# Patient Record
Sex: Male | Born: 1940
Health system: Southern US, Community
[De-identification: ages and names within clinical notes are randomized; demographics above are authoritative.]

## PROBLEM LIST (undated history)

## (undated) DIAGNOSIS — N402 Nodular prostate without lower urinary tract symptoms: Secondary | ICD-10-CM

## (undated) DIAGNOSIS — M722 Plantar fascial fibromatosis: Secondary | ICD-10-CM

## (undated) DIAGNOSIS — Z860101 Personal history of adenomatous and serrated colon polyps: Secondary | ICD-10-CM

## (undated) DIAGNOSIS — Z8719 Personal history of other diseases of the digestive system: Secondary | ICD-10-CM

## (undated) DIAGNOSIS — Z974 Presence of external hearing-aid: Secondary | ICD-10-CM

## (undated) DIAGNOSIS — Z961 Presence of intraocular lens: Secondary | ICD-10-CM

## (undated) DIAGNOSIS — K449 Diaphragmatic hernia without obstruction or gangrene: Secondary | ICD-10-CM

## (undated) DIAGNOSIS — M8580 Other specified disorders of bone density and structure, unspecified site: Secondary | ICD-10-CM

## (undated) DIAGNOSIS — N485 Ulcer of penis: Secondary | ICD-10-CM

## (undated) DIAGNOSIS — Z86010 Personal history of colonic polyps: Secondary | ICD-10-CM

## (undated) DIAGNOSIS — R0600 Dyspnea, unspecified: Secondary | ICD-10-CM

## (undated) DIAGNOSIS — D649 Anemia, unspecified: Secondary | ICD-10-CM

## (undated) DIAGNOSIS — K219 Gastro-esophageal reflux disease without esophagitis: Secondary | ICD-10-CM

## (undated) HISTORY — PX: INGUINAL HERNIA REPAIR: SUR1180

## (undated) HISTORY — DX: Anemia, unspecified: D64.9

## (undated) HISTORY — PX: CATARACT EXTRACTION W/ INTRAOCULAR LENS  IMPLANT, BILATERAL: SHX1307

## (undated) HISTORY — DX: Presence of intraocular lens: Z96.1

## (undated) HISTORY — PX: COLONOSCOPY: SHX174

## (undated) HISTORY — DX: Plantar fascial fibromatosis: M72.2

## (undated) HISTORY — DX: Other specified disorders of bone density and structure, unspecified site: M85.80

## (undated) HISTORY — PX: TONSILLECTOMY: SHX5217

## (undated) HISTORY — DX: Gastro-esophageal reflux disease without esophagitis: K21.9

## (undated) HISTORY — PX: ESOPHAGOGASTRODUODENOSCOPY: SHX1529

---

## 1999-04-04 ENCOUNTER — Ambulatory Visit (HOSPITAL_BASED_OUTPATIENT_CLINIC_OR_DEPARTMENT_OTHER): Admission: RE | Admit: 1999-04-04 | Discharge: 1999-04-04 | Payer: Self-pay | Admitting: *Deleted

## 2004-12-01 ENCOUNTER — Ambulatory Visit: Payer: Self-pay | Admitting: Internal Medicine

## 2004-12-12 ENCOUNTER — Encounter (INDEPENDENT_AMBULATORY_CARE_PROVIDER_SITE_OTHER): Payer: Self-pay | Admitting: *Deleted

## 2004-12-12 ENCOUNTER — Ambulatory Visit (HOSPITAL_COMMUNITY): Admission: RE | Admit: 2004-12-12 | Discharge: 2004-12-12 | Payer: Self-pay | Admitting: Internal Medicine

## 2004-12-12 ENCOUNTER — Ambulatory Visit: Payer: Self-pay | Admitting: Internal Medicine

## 2004-12-12 DIAGNOSIS — K219 Gastro-esophageal reflux disease without esophagitis: Secondary | ICD-10-CM

## 2004-12-12 HISTORY — DX: Gastro-esophageal reflux disease without esophagitis: K21.9

## 2005-01-27 ENCOUNTER — Ambulatory Visit: Payer: Self-pay | Admitting: Internal Medicine

## 2007-01-07 ENCOUNTER — Ambulatory Visit: Payer: Self-pay | Admitting: Internal Medicine

## 2007-01-11 ENCOUNTER — Ambulatory Visit: Payer: Self-pay | Admitting: Internal Medicine

## 2007-01-11 LAB — CONVERTED CEMR LAB
AST: 27 units/L (ref 0–37)
Albumin: 3.8 g/dL (ref 3.5–5.2)
BUN: 15 mg/dL (ref 6–23)
Basophils Absolute: 0 10*3/uL (ref 0.0–0.1)
CO2: 30 meq/L (ref 19–32)
Cholesterol: 206 mg/dL (ref 0–200)
Creatinine, Ser: 0.9 mg/dL (ref 0.4–1.5)
Direct LDL: 133.3 mg/dL
Eosinophils Absolute: 0.4 10*3/uL (ref 0.0–0.6)
HCT: 43.5 % (ref 39.0–52.0)
Hemoglobin, Urine: NEGATIVE
Leukocytes, UA: NEGATIVE
Lymphocytes Relative: 31.1 % (ref 12.0–46.0)
MCV: 91.6 fL (ref 78.0–100.0)
Mucus, UA: NEGATIVE
Neutrophils Relative %: 51.1 % (ref 43.0–77.0)
PSA: 0.87 ng/mL (ref 0.10–4.00)
Potassium: 4 meq/L (ref 3.5–5.1)
RBC: 4.74 M/uL (ref 4.22–5.81)
Sodium: 143 meq/L (ref 135–145)
Specific Gravity, Urine: 1.02 (ref 1.000–1.03)
Squamous Epithelial / LPF: NEGATIVE /lpf
TSH: 2.37 microintl units/mL (ref 0.35–5.50)
Total Protein, Urine: NEGATIVE mg/dL
Triglycerides: 57 mg/dL (ref 0–149)
VLDL: 11 mg/dL (ref 0–40)
WBC: 5.8 10*3/uL (ref 4.5–10.5)

## 2007-01-20 ENCOUNTER — Ambulatory Visit: Payer: Self-pay | Admitting: Internal Medicine

## 2008-04-26 ENCOUNTER — Ambulatory Visit: Payer: Self-pay | Admitting: Internal Medicine

## 2008-04-26 DIAGNOSIS — Z860100 Personal history of colon polyps, unspecified: Secondary | ICD-10-CM | POA: Insufficient documentation

## 2008-04-26 DIAGNOSIS — K219 Gastro-esophageal reflux disease without esophagitis: Secondary | ICD-10-CM | POA: Insufficient documentation

## 2008-08-30 ENCOUNTER — Ambulatory Visit: Payer: Self-pay | Admitting: Internal Medicine

## 2008-08-30 DIAGNOSIS — E299 Testicular dysfunction, unspecified: Secondary | ICD-10-CM | POA: Insufficient documentation

## 2008-08-30 DIAGNOSIS — R234 Changes in skin texture: Secondary | ICD-10-CM | POA: Insufficient documentation

## 2008-09-03 ENCOUNTER — Ambulatory Visit (HOSPITAL_BASED_OUTPATIENT_CLINIC_OR_DEPARTMENT_OTHER): Admission: RE | Admit: 2008-09-03 | Discharge: 2008-09-03 | Payer: Self-pay | Admitting: Internal Medicine

## 2008-09-03 ENCOUNTER — Telehealth: Payer: Self-pay | Admitting: Internal Medicine

## 2009-07-13 ENCOUNTER — Encounter: Payer: Self-pay | Admitting: Internal Medicine

## 2009-07-15 ENCOUNTER — Encounter: Payer: Self-pay | Admitting: Internal Medicine

## 2009-08-26 ENCOUNTER — Ambulatory Visit: Payer: Self-pay | Admitting: Internal Medicine

## 2009-08-26 LAB — CONVERTED CEMR LAB
ALT: 26 units/L (ref 0–53)
AST: 20 units/L (ref 0–37)
BUN: 19 mg/dL (ref 6–23)
CO2: 25 meq/L (ref 19–32)
Chloride: 105 meq/L (ref 96–112)
Cholesterol: 205 mg/dL — ABNORMAL HIGH (ref 0–200)
Creatinine, Ser: 0.97 mg/dL (ref 0.40–1.50)
Glucose, Bld: 102 mg/dL — ABNORMAL HIGH (ref 70–99)
LDL Cholesterol: 133 mg/dL — ABNORMAL HIGH (ref 0–99)
TSH: 2.107 microintl units/mL (ref 0.350–4.500)
Total CHOL/HDL Ratio: 3.6
Triglycerides: 74 mg/dL (ref <150)
VLDL: 15 mg/dL (ref 0–40)

## 2009-08-27 ENCOUNTER — Encounter: Payer: Self-pay | Admitting: Internal Medicine

## 2009-09-04 ENCOUNTER — Encounter: Payer: Self-pay | Admitting: Internal Medicine

## 2009-10-23 ENCOUNTER — Encounter (INDEPENDENT_AMBULATORY_CARE_PROVIDER_SITE_OTHER): Payer: Self-pay | Admitting: *Deleted

## 2009-11-29 ENCOUNTER — Encounter (INDEPENDENT_AMBULATORY_CARE_PROVIDER_SITE_OTHER): Payer: Self-pay | Admitting: *Deleted

## 2009-12-02 ENCOUNTER — Ambulatory Visit: Payer: Self-pay | Admitting: Internal Medicine

## 2009-12-09 ENCOUNTER — Ambulatory Visit: Payer: Self-pay | Admitting: Internal Medicine

## 2009-12-09 LAB — HM COLONOSCOPY

## 2009-12-10 ENCOUNTER — Encounter: Payer: Self-pay | Admitting: Internal Medicine

## 2009-12-13 ENCOUNTER — Encounter (INDEPENDENT_AMBULATORY_CARE_PROVIDER_SITE_OTHER): Payer: Self-pay | Admitting: *Deleted

## 2009-12-16 ENCOUNTER — Ambulatory Visit: Payer: Self-pay | Admitting: Internal Medicine

## 2009-12-24 ENCOUNTER — Ambulatory Visit: Payer: Self-pay | Admitting: Internal Medicine

## 2009-12-24 DIAGNOSIS — K319 Disease of stomach and duodenum, unspecified: Secondary | ICD-10-CM | POA: Insufficient documentation

## 2009-12-27 ENCOUNTER — Encounter: Payer: Self-pay | Admitting: Internal Medicine

## 2010-02-18 ENCOUNTER — Encounter (INDEPENDENT_AMBULATORY_CARE_PROVIDER_SITE_OTHER): Payer: Self-pay | Admitting: *Deleted

## 2010-03-07 ENCOUNTER — Ambulatory Visit: Payer: Self-pay | Admitting: Internal Medicine

## 2010-03-07 DIAGNOSIS — E663 Overweight: Secondary | ICD-10-CM | POA: Insufficient documentation

## 2010-03-07 DIAGNOSIS — K222 Esophageal obstruction: Secondary | ICD-10-CM | POA: Insufficient documentation

## 2010-07-19 ENCOUNTER — Encounter: Payer: Self-pay | Admitting: Internal Medicine

## 2010-07-22 ENCOUNTER — Encounter: Payer: Self-pay | Admitting: Internal Medicine

## 2010-09-03 ENCOUNTER — Encounter: Payer: Self-pay | Admitting: Internal Medicine

## 2010-11-30 LAB — CONVERTED CEMR LAB
Bacteria, UA: NEGATIVE
Cholesterol: 199 mg/dL (ref 0–200)
Creatinine, Ser: 0.9 mg/dL (ref 0.4–1.5)
Crystals: NEGATIVE
Eosinophils Absolute: 0.3 10*3/uL (ref 0.0–0.7)
HCT: 41.8 % (ref 39.0–52.0)
Ketones, ur: NEGATIVE mg/dL
Monocytes Absolute: 0.4 10*3/uL (ref 0.1–1.0)
Nitrite: NEGATIVE
RBC: 4.52 M/uL (ref 4.22–5.81)
Squamous Epithelial / LPF: NEGATIVE /lpf
Total CHOL/HDL Ratio: 4.1
Triglycerides: 58 mg/dL (ref 0–149)
VLDL: 12 mg/dL (ref 0–40)
pH: 6.5 (ref 5.0–8.0)

## 2010-12-02 NOTE — Letter (Signed)
Summary: Patient Notice-Endo Biopsy Results  [facility]  [address] [place], [place] [address]   Phone: [phone]  Fax: [phone]        December 27, 2009 MRN: [id number]    Miguel Thomas [address] [place], [place]  [address]    Dear Mr. GERHOLD,  I am pleased to inform you that the biopsies taken during your recent endoscopic examination did not show any evidence of cancer upon pathologic examination.  They did show some possible pre-cancerous changes at the top ofthe stomach.It is important that you continue to take yor omeprazole at the increased dose and I would like to see you inthe office in 3 months to review things.  Fortunately, it is rare that cancer develops in this type of situation but I do think you should follow-up with me and we can discuss how best to follow this.  Please call [phone] to schedule a return visit to review      your condition. You should see me in June (those appointments should be able to be scheduled in April 2011).  Please call us if you are having persistent problems or have questions about your condition that have not been fully answered at this time.  Sincerely,  Iva Boop MD, [facility]  This letter has been electronically signed by your physician.  Appended Document: Patient Notice-Endo Biopsy Results Letter mailed 2.25.11

## 2010-12-02 NOTE — Letter (Signed)
Summary: Patient Notice- Polyp Results  [facility]  [address] [place], [place] [address]   Phone: [phone]  Fax: [phone]        December 10, 2009 MRN: [id number]    Miguel Thomas [address] [place], [place]  [address]    Dear [name],  The polyp removed from your colon was adenomatous. This means that it was pre-cancerous or that  it had the potential to change into cancer over time.  I recommend that you have a repeat colonoscopy in 5 years to determine if you have developed any new polyps over time. If you develop any new rectal bleeding, abdominal pain or significant bowel habit changes, please contact us before then.  Please call us if you are having persistent problems or have questions about your condition that have not been fully answered at this time.   Sincerely,  Iva Boop MD, [facility]  This letter has been electronically signed by your physician.  Appended Document: Patient Notice- Polyp Results letter mailed 2.11.11

## 2010-12-02 NOTE — Miscellaneous (Signed)
Summary: [facility] PV  Clinical Lists Changes  Observations: Added new observation of NKA: T (12/16/2009 16:17)

## 2010-12-02 NOTE — Letter (Signed)
Summary: EGD Instructions  [facility]  [address] [place], [place] [address]   Phone: [phone]  Fax: [phone]       Miguel Thomas    [birth year]    MRN: [id number]       Procedure Day /Date: Tuesday 12/24/2009     Arrival Time: 7:30 am     Procedure Time: 8:30 am     Location of Procedure:                    _x  _ [facility] (4th Floor)    PREPARATION FOR ENDOSCOPY   On Tuesday 12/24/2009 THE DAY OF THE PROCEDURE:  1.   No solid foods, milk or milk products are allowed after midnight the night before your procedure.  2.   Do not drink anything colored red or purple.  Avoid juices with pulp.  No orange juice.  3.  You may drink clear liquids until 6:30 am, which is 2 hours before your procedure.                                                                                                CLEAR LIQUIDS INCLUDE: Water Jello Ice Popsicles Tea (sugar ok, no milk/cream) Powdered fruit flavored drinks Coffee (sugar ok, no milk/cream) Gatorade Juice: apple, white grape, white cranberry  Lemonade Clear bullion, consomm, broth Carbonated beverages (any kind) Strained chicken noodle soup Hard Candy   MEDICATION INSTRUCTIONS  Unless otherwise instructed, you should take regular prescription medications with a small sip of water as early as possible the morning of your procedure.            OTHER INSTRUCTIONS  You will need a responsible adult at least 72 years of age to accompany you and drive you home.   This person must remain in the waiting room during your procedure.  Wear loose fitting clothing that is easily removed.  Leave jewelry and other valuables at home.  However, you may wish to bring a book to read or an iPod/MP3 player to listen to music as you wait for your procedure to start.  Remove all body piercing jewelry and leave at home.  Total time from sign-in until discharge is approximately 2-3 hours.  You should go  home directly after your procedure and rest.  You can resume normal activities the day after your procedure.  The day of your procedure you should not:   Drive   Make legal decisions   Operate machinery   Drink alcohol   Return to work  You will receive specific instructions about eating, activities and medications before you leave.    The above instructions have been reviewed and explained to me by   Ezra Sites RN  December 16, 2009 5:03 PM   I fully understand and can verbalize these instructions _____________________________ Date _________

## 2010-12-02 NOTE — Letter (Signed)
Summary: [facility]  [facility]   Imported By: Lanelle Bal 09/17/2010 09:29:13  _____________________________________________________________________  External Attachment:    Type:   Image     Comment:   External Document

## 2010-12-02 NOTE — Procedures (Signed)
Summary: Upper Endoscopy w/DIL  Patient: Miguel Thomas Note: All result statuses are Final unless otherwise noted.  Tests: (1) Upper Endoscopy w/DIL (UED)  UED Upper Endoscopy w/DIL                             DONE     [facility]     [address]     [place], [place]  [address]           ENDOSCOPY PROCEDURE REPORT           PATIENT:  [name], [name]  MR#:  [id number]     BIRTHDATE:  1940/11/18, 69 yrs. old  GENDER:  male           ENDOSCOPIST:  Iva Boop, MD, [facility]           PROCEDURE DATE:  12/24/2009     PROCEDURE:  EGD with biopsy, Elease Hashimoto Dilation of the Esophagus     ASA CLASS:  Class I     INDICATIONS:  1) dysphagia  2) dilation of esophageal stricture           MEDICATIONS:   Fentanyl 25 mcg IV, Versed 3 mg IV     TOPICAL ANESTHETIC:  Exactacain Spray           DESCRIPTION OF PROCEDURE:   After the risks benefits and     alternatives of the procedure were thoroughly explained, informed     consent was obtained.  The LB GIF-H180 [id number] endoscope was     introduced through the mouth and advanced to the second portion of     the duodenum, without limitations.  The instrument was slowly     withdrawn as the mucosa was carefully examined.     <<PROCEDUREIMAGES>>           An esophageal ring was found at the gastroesophageal junction.  A     hiatal hernia was found. It was 2 - 3 cm in size. 40-42/43 cm,     sliding.  there was ? abnormal mucosa in the cardia.     Salmon-colored ? columnar mucosal patch on gastric side of GE     junction in hiatal hernia sac. With standard forceps, a biopsy was     obtained and sent to pathology.  The examination was otherwise     normal.    Dilation was then performed at the gastroesphageal     junction           1) Dilator:  Elease Hashimoto  Size(s):  54 French     Resistance:  minimal  Heme:  none           COMPLICATIONS:  None           ENDOSCOPIC IMPRESSION:     1) Ring, esophageal at the gastroesophageal junction -dilated  54     French     2) 2 - 3 cm hiatal hernia     3) Abnormal mucosa in the cardia - ? columnar changes just below     Z-line - biospied     4) Otherwise normal examination.     RECOMMENDATIONS:     1) await biopsy results     Increase omeprazole to 40 mg daily (#30, 11 refills, take 1     30-60 mins before breakfast)           Clear liquids only til 1030 AM then soft  diet today. Normal diet     tomorrow.           REPEAT EXAM:  as needed           Iva Boop, MD, [facility]           CC:  Thomos Lemons, DO     The Patient           n.     eSIGNED:   Iva Boop at 12/24/2009 08:52 AM           Tish Men, [id number]  Note: An exclamation Timathy (!) indicates a result that was not dispersed into the flowsheet. Document Creation Date: 12/24/2009 8:52 AM _______________________________________________________________________  (1) Order result status: Final Collection or observation date-time: 12/24/2009 08:42 Requested date-time:  Receipt date-time:  Reported date-time:  Referring Physician:   Ordering Physician: Stan Head [phone]) Specimen Source:  Source: Launa Grill Order Number: [id number] Lab site:   Appended Document: Upper Endoscopy w/DIL   EGD  Procedure date:  12/24/2009  Findings:         1) Ring, esophageal at the gastroesophageal junction -dilated 54     French     2) 2 - 3 cm hiatal hernia     3) Abnormal mucosa in the cardia - ? columnar changes just below INTESTINAL METAPLASIA     Z-line - biospied     4) Otherwise normal examination.  Procedures Next Due Date:    EGD: 01/2011

## 2010-12-02 NOTE — Letter (Signed)
Summary: [facility] Instructions  [facility]  [address] [place], [place] [address]   Phone: [phone]  Fax: [phone]       Miguel Thomas    08-Mar-1941    MRN: [id number]        Procedure Day /Date:  Monday 02/07     Arrival Time:  7:30am     Procedure Time: 8:30am     Location of Procedure:                    Juliann Pares _  [facility] (4th Floor)                        PREPARATION FOR COLONOSCOPY WITH MOVIPREP   Starting 5 days prior to your procedure  Wednesday 02/02  do not eat nuts, seeds, popcorn, corn, beans, peas,  salads, or any raw vegetables.  Do not take any fiber supplements (e.g. Metamucil, Citrucel, and Benefiber).  THE DAY BEFORE YOUR PROCEDURE         DATE:  02/06   DAY:  Sunday  1.  Drink clear liquids the entire day-NO SOLID FOOD  2.  Do not drink anything colored red or purple.  Avoid juices with pulp.  No orange juice.  3.  Drink at least 64 oz. (8 glasses) of fluid/clear liquids during the day to prevent dehydration and help the prep work efficiently.  CLEAR LIQUIDS INCLUDE: Water Jello Ice Popsicles Tea (sugar ok, no milk/cream) Powdered fruit flavored drinks Coffee (sugar ok, no milk/cream) Gatorade Juice: apple, white grape, white cranberry  Lemonade Clear bullion, consomm, broth Carbonated beverages (any kind) Strained chicken noodle soup Hard Candy                             4.  In the morning, mix first dose of MoviPrep solution:    Empty 1 Pouch A and 1 Pouch B into the disposable container    Add lukewarm drinking water to the top line of the container. Mix to dissolve    Refrigerate (mixed solution should be used within 24 hrs)  5.  Begin drinking the prep at 5:00 p.m. The MoviPrep container is divided by 4 marks.   Every 15 minutes drink the solution down to the next Aivan (approximately 8 oz) until the full liter is complete.   6.  Follow completed prep with 16 oz of clear liquid of your choice  (Nothing red or purple).  Continue to drink clear liquids until bedtime.  7.  Before going to bed, mix second dose of MoviPrep solution:    Empty 1 Pouch A and 1 Pouch B into the disposable container    Add lukewarm drinking water to the top line of the container. Mix to dissolve    Refrigerate  THE DAY OF YOUR PROCEDURE      DATE:  02/07  DAY:  Monday  Beginning at  3:30 a.m. (5 hours before procedure):         1. Every 15 minutes, drink the solution down to the next Naethan (approx 8 oz) until the full liter is complete.  2. Follow completed prep with 16 oz. of clear liquid of your choice.    3. You may drink clear liquids until 6:30am  (2 HOURS BEFORE PROCEDURE).   MEDICATION INSTRUCTIONS  Unless otherwise instructed, you should take regular prescription medications with a small sip of  water   as early as possible the morning of your procedure.         OTHER INSTRUCTIONS  You will need a responsible adult at least 72 years of age to accompany you and drive you home.   This person must remain in the waiting room during your procedure.  Wear loose fitting clothing that is easily removed.  Leave jewelry and other valuables at home.  However, you may wish to bring a book to read or  an iPod/MP3 player to listen to music as you wait for your procedure to start.  Remove all body piercing jewelry and leave at home.  Total time from sign-in until discharge is approximately 2-3 hours.  You should go home directly after your procedure and rest.  You can resume normal activities the  day after your procedure.  The day of your procedure you should not:   Drive   Make legal decisions   Operate machinery   Drink alcohol   Return to work  You will receive specific instructions about eating, activities and medications before you leave.    The above instructions have been reviewed and explained to me by   Wyona Almas RN  December 02, 2009 8:16 AM     I fully  understand and can verbalize these instructions _____________________________ Date _________

## 2010-12-02 NOTE — Procedures (Signed)
Summary: Colonoscopy  Patient: [name] Note: All result statuses are Final unless otherwise noted.  Tests: (1) Colonoscopy (COL)   COL Colonoscopy           DONE      [facility]     [address]     [place], [place]  [address]           COLONOSCOPY PROCEDURE REPORT           PATIENT:  Miguel Thomas, Miguel Thomas  MR#:  [id number]     BIRTHDATE:  07-May-1941, 68 yrs. old  GENDER:  male           ENDOSCOPIST:  Iva Boop, MD, [facility]           PROCEDURE DATE:  12/09/2009     PROCEDURE:  Colonoscopy with snare polypectomy     ASA CLASS:  Class I     INDICATIONS:  history of pre-cancerous (adenomatous) colon polyps     Two 4 mm adenomas removed 12/2004           MEDICATIONS:   Fentanyl 50 mcg IV, Versed 5 mg IV           DESCRIPTION OF PROCEDURE:   After the risks benefits and     alternatives of the procedure were thoroughly explained, informed     consent was obtained.  Digital rectal exam was performed and     revealed no abnormalities and normal prostate.   The LB CF-H180AL     [id number] endoscope was introduced through the anus and advanced to     the cecum, which was identified by both the appendix and ileocecal     valve, without limitations.  The quality of the prep was     excellent, using MoviPrep.  The instrument was then slowly     withdrawn as the colon was fully examined.     Insertion: 4:59 minutes Withdrawal: 12:32 minutes     <<PROCEDUREIMAGES>>           FINDINGS:  A sessile polyp was found in the descending colon. It     was 7 mm in size. Polyp was snared without cautery. Retrieval was     successful. snare polyp  Moderate diverticulosis was found in the     sigmoid colon.  This was otherwise a normal examination of the     colon.   Retroflexed views in the rectum revealed internal     hemorrhoids.    The scope was then withdrawn from the patient and     the procedure completed.           COMPLICATIONS:  None           ENDOSCOPIC IMPRESSION:     1) 7 mm  sessile polyp in the descending colon - removed     2) Moderate diverticulosis in the sigmoid colon     3) Internal hemorrhoids - small     4) Otherwise normal examination - excellent prep     5) Prior adenomas removed in 12/2004     RECOMMENDATIONS:     He is having recurrent dysphagia and has hx of esophageal     stricture dilation. My office will call to arrange EGD and     dilation (in [facility]).           REPEAT EXAM:  In for Colonoscopy, pending biopsy results.           Iva Boop, MD, [facility]  CC:  Thomos Lemons, DO     The Patient           n.     eSIGNED:   Iva Boop at 12/09/2009 09:11 AM           Tish Men, [id number]  Note: An exclamation Zayin (!) indicates a result that was not dispersed into the flowsheet. Document Creation Date: 12/09/2009 9:11 AM _______________________________________________________________________  (1) Order result status: Final Collection or observation date-time: 12/09/2009 09:02 Requested date-time:  Receipt date-time:  Reported date-time:  Referring Physician:   Ordering Physician: Stan Head [phone]) Specimen Source:  Source: Launa Grill Order Number: [id number] Lab site:   Appended Document: Colonoscopy     Procedures Next Due Date:    Colonoscopy: 12/2014

## 2010-12-02 NOTE — Assessment & Plan Note (Signed)
Summary: recall ov...as.   History of Present Illness Visit Type: Follow-up Visit Primary GI MD: Stan Head MD [facility] Primary Provider: Dondra Spry DO Chief Complaint: recall rev, Pt denies any GI sx. History of Present Illness:   70 yo man with GERD and esophageal stricture. He is not having dysphagia after esophageal dilation and PPI therapy. He had intestinal metaplasia of the cardia foyund at [facility].   GI Review of Systems      Denies abdominal pain, acid reflux, belching, bloating, chest pain, dysphagia with liquids, dysphagia with solids, heartburn, loss of appetite, nausea, vomiting, vomiting blood, weight loss, and  weight gain.        Denies anal fissure, black tarry stools, change in bowel habit, constipation, diarrhea, diverticulosis, fecal incontinence, heme positive stool, hemorrhoids, irritable bowel syndrome, jaundice, light color stool, liver problems, rectal bleeding, and  rectal pain. Preventive Screening-Counseling & Management  Alcohol-Tobacco     Smoking Status: never    Current Medications (verified): 1)  Omeprazole 40 Mg  Cpdr (Omeprazole) .Marland Kitchen.. 1 Each Day 30 Minutes Before Meal  Allergies (verified): No Known Drug Allergies  Past History:  Past Medical History: Colonoscopy performed 12/12/2004 - diverticulosis, two small right colon polyps ( adenomatous polyp negative for high grade dysplasia or malignancy)  GERD - ( EGD 12/12/2004 - ring like esophageal stricture with reflux esophagitis), also in 2011 Hearing loss   Adenomatous Colon Polyps Intestinal metaplasia of gastric cardia  Past Surgical History: Reviewed history from 03/06/2010 and no changes required. Hernia Surgery Tonsillectomy  Family History: Reviewed history from 08/26/2009 and no changes required. Family history of coronary artery disease Family history of rheumatic heart disease    Social History: Married (2nd marriage)  he has one son Occupation:  auto parts delivery  Daily  Caffeine Use up to 6/day Patient has never smoked.  Alcohol Use - no  Vital Signs:  Patient profile:   70 year old male Height:      70 inches Weight:      204.38 pounds BMI:     29.43 Pulse rate:   60 / minute Pulse rhythm:   regular BP sitting:   118 / 70  (right arm) Cuff size:   regular  Vitals Entered By: Christie Nottingham CMA Duncan Dull) (Mar 07, 2010 2:38 PM)   Impression & Recommendations:  Problem # 1:  ESOPHAGEAL STRICTURE (ICD-530.3) Assessment Improved continue PPI reduce caffeine lose weight  Problem # 2:  GERD (ICD-530.81) Assessment: Improved continue PPI reduce caffeine lose weight  Problem # 3:  OTHER SPECIFIED DISORDER OF STOMACH AND DUODENUM (ICD-537.89) Assessment: New intestinal metaplasia of cardia, not clear how to follow this there is a real but very rare risk of malignanct I explained that will recheck with EGD 1 year from last as a precaution  Problem # 4:  OVERWEIGHT (ICD-278.02) Assessment: New challenged him to lose at least 10# in 1 year  Patient Instructions: 1)  Please continue current medications.  2)  Plan on repeat upper endoscopy around Feb 2012. 3)  You need to lose weight. Start by limiting portions, amounts. Avoid eating when not hungry. Limit desserts.Look for high fructose corn syrup on food labels and if in first 3 ingredients, avoid that food. Also try to eat whole grains, avoid "white foods" (e.g. white rice, white bread).   4)  Try to lose 10-15# in the next year - goal is to be less than 200# or more 5)  The medication list was reviewed  and reconciled.  All changed / newly prescribed medications were explained.  A complete medication list was provided to the patient / caregiver.

## 2010-12-02 NOTE — Miscellaneous (Signed)
Summary: [facility] Previsit/prep  Clinical Lists Changes  Medications: Added new medication of MOVIPREP 100 GM  SOLR (PEG-KCL-NACL-NASULF-NA ASC-C) As per prep instructions. - Signed Rx of MOVIPREP 100 GM  SOLR (PEG-KCL-NACL-NASULF-NA ASC-C) As per prep instructions.;  #1 x 0;  Signed;  Entered by: Wyona Almas RN;  Authorized by: Iva Boop MD, FACG;  Method used: Electronically to [facility]  [address] [id number] [address] [place], [place]  [address], Ph: [phone] or [phone], Fax: [phone] Observations: Added new observation of NKA: T (12/02/2009 7:43)    Prescriptions: MOVIPREP 100 GM  SOLR (PEG-KCL-NACL-NASULF-NA ASC-C) As per prep instructions.  #1 x 0   Entered by:   Wyona Almas RN   Authorized by:   Iva Boop MD, [facility]   Signed by:   Wyona Almas RN on 12/02/2009   Method used:   [facility] to        [facility]  [address] [id number] (retail)       [address]       [place], [place]  [address]       Ph: [phone] or [phone]       Fax: [phone]   RxID:   [id number]

## 2010-12-02 NOTE — Miscellaneous (Signed)
Summary: flu vaccine  Clinical Lists Changes  Observations: Added new observation of FLU VAX: Historical (07/19/2010 8:20)      Immunization History:  Influenza Immunization History:    Influenza:  historical (07/19/2010)

## 2010-12-02 NOTE — Miscellaneous (Signed)
Summary: omeprazole increase order to 40mg  daily  Clinical Lists Changes  Medications: Added new medication of OMEPRAZOLE 40 MG  CPDR (OMEPRAZOLE) 1 each day 30 minutes before meal - Signed Rx of OMEPRAZOLE 40 MG  CPDR (OMEPRAZOLE) 1 each day 30 minutes before meal;  #30 x 11;  Signed;  Entered by: Alease Frame RN;  Authorized by: Iva Boop MD, FACG;  Method used: [facility]  [address] [id number] [address] [place], [place]  [address], Ph: [phone] or [phone], Fax: [phone] Observations: Added new observation of ALLERGY REV: Done (12/24/2009 8:58) Added new observation of NKA: T (12/24/2009 8:58)    Prescriptions: OMEPRAZOLE 40 MG  CPDR (OMEPRAZOLE) 1 each day 30 minutes before meal  #30 x 11   Entered by:   Alease Frame RN   Authorized by:   Iva Boop MD, [facility]   Signed by:   Alease Frame RN on 12/24/2009   Method used:   [facility]        [facility]  [address] [id number] ([facility]ail)       [address]       [place], [place]  [address]       Ph: [phone] or [phone]       Fax: [phone]   RxID:   [id number]

## 2010-12-02 NOTE — Procedures (Signed)
Summary: EGD   EGD  Procedure date:  12/12/2004  Findings:      Findings: Stricture:  Location: [facility]   Patient Name: Miguel Thomas, Miguel Thomas MRN: [id number] Procedure Procedures: Panendoscopy (EGD) CPT: 43235.    with balloon dilation. CPT: T9508883.  Personnel: Endoscopist: Iva Boop, MD, [facility]  Referred By: Nilda Simmer, MD.  Exam Location: Exam performed in Endoscopy Suite. Outpatient  Patient Consent: Procedure, Alternatives, Risks and Benefits discussed, consent obtained,  Indications Symptoms: Dysphagia.  History  Current Medications: Patient is not currently taking Coumadin.  Pre-Exam Physical: Performed Dec 12, 2004  Cardio-pulmonary exam, HEENT exam, Mental status exam WNL.  Exam Exam Info: Maximum depth of insertion Duodenum, intended Duodenum. Patient position: on left side. Gastric retroflexion performed. Images taken. ASA Classification: I. Tolerance: excellent.  Sedation Meds: Patient assessed and found to be appropriate for moderate (conscious) sedation. Benzocaine/tetracaine given aerosolized. Versed 5 mg. given IV. Fentanyl 50 mcg. given IV.  Monitoring: BP and pulse monitoring done. Oximetry used. Supplemental O2 given  Fluoroscopy: Fluoroscopy was not used.  Findings Normal: Proximal Esophagus to Mid Esophagus.  STRICTURE / STENOSIS: Stricture in Distal Esophagus.  Constriction: partial. Etiology: benign due to reflux. Lumen diameter is 14 mm. ICD9: Esophageal Stricture: 530.3.  - Dilation: Distal Esophagus. for esophageal stricture. Balloon/Microvasive dilator used, Diameter: 15,16.5,18 mm, Moderate Resistance, Minimal Heme present on extraction. Patient tolerance excellent. Outcome: successful.  ESOPHAGEAL INFLAMMATION: as a result of reflux. Length of inflammation: 1 cm. [place] Classification: Grade B. ICD9: Esophagitis, Reflux: 530.11.  - Normal: Fundus to Duodenal 2nd Portion.   Assessment Abnormal examination, see  findings above.  Diagnoses: 530.3: Esophageal Stricture.  530.11: Esophagitis, Reflux.   Comments: Ring-like esophageal stricture with reflux esophagitis. Events  Unplanned Intervention: No unplanned interventions were required.  Plans Instructions: Clear or full liquids: until 130 PM. Resume previous diet: soft today.  Medication(s): PPI: Omeprazole/Prilosec 20 mg QD,   Comments: Try generic omeprazole vs. Prilosec otc each day Disposition: After procedure patient sent to recovery.  Scheduling: Colonoscopy, to Iva Boop, MD, [facility], next  Call office for appointment, to Iva Boop, MD, [facility], 6-8 weeks   CC:   Nilda Simmer, MD  This report was created from the original endoscopy report, which was reviewed and signed by the above listed endoscopist.

## 2010-12-02 NOTE — Miscellaneous (Signed)
Summary: [facility]  [facility]   Imported By: Lanelle Bal 07/31/2010 08:33:37  _____________________________________________________________________  External Attachment:    Type:   Image     Comment:   External Document

## 2010-12-02 NOTE — Letter (Signed)
Summary: Office Visit Letter  [facility]  [address] [place], [place] [address]   Phone: [phone]  Fax: [phone]      February 18, 2010 MRN: [id number]   Miguel Thomas [address] [place], [place]  [address]   Dear Mr. HEIDRICH,   According to our records, it is time for you to schedule a follow-up office visit with [place].   At your convenience, please call [phone] (option #2)to schedule an office visit. If you have any questions, concerns, or feel that this letter is in error, we would appreciate your call.   Sincerely,    Iva Boop, M.D.  [facility] Gastroenterology Division [phone]

## 2010-12-22 ENCOUNTER — Encounter: Payer: Self-pay | Admitting: Internal Medicine

## 2010-12-26 ENCOUNTER — Encounter (INDEPENDENT_AMBULATORY_CARE_PROVIDER_SITE_OTHER): Payer: Self-pay

## 2010-12-30 ENCOUNTER — Encounter: Payer: Self-pay | Admitting: Internal Medicine

## 2010-12-30 NOTE — Letter (Signed)
Summary: Endoscopy Letter  [facility]  [address]   [place], [place] [address]   Phone: [phone]  Fax: [phone]      December 22, 2010 MRN: [id number]   Miguel Thomas [address] [place], [place]  [address]   Dear Miguel Thomas,   According to your medical record, it is time for you to schedule an Endoscopy. Endoscopic screening is recommended for patients with certain upper digestive tract conditions because of associated increased risk for cancers of the upper digestive system.  This letter has been generated based on the recommendations made at the time of your prior procedure. If you feel that in your particular situation this may no longer apply, please contact our office.  Please call our office at [phone]) to schedule this appointment or to update your records at your earliest convenience.  Thank you for cooperating with [place] to provide you with the very best care possible.   Sincerely,   Stan Head, M.D.  [facility] Gastroenterology Division [phone]

## 2011-01-06 ENCOUNTER — Other Ambulatory Visit: Payer: Self-pay | Admitting: Internal Medicine

## 2011-01-06 ENCOUNTER — Other Ambulatory Visit (AMBULATORY_SURGERY_CENTER): Payer: Medicare Other | Admitting: Internal Medicine

## 2011-01-06 DIAGNOSIS — K449 Diaphragmatic hernia without obstruction or gangrene: Secondary | ICD-10-CM

## 2011-01-06 DIAGNOSIS — K219 Gastro-esophageal reflux disease without esophagitis: Secondary | ICD-10-CM

## 2011-01-06 DIAGNOSIS — R933 Abnormal findings on diagnostic imaging of other parts of digestive tract: Secondary | ICD-10-CM

## 2011-01-08 NOTE — Miscellaneous (Signed)
Summary: Lec previsit..  Clinical Lists Changes  Observations: Added new observation of NKA: T (12/30/2010 7:55)

## 2011-01-08 NOTE — Letter (Signed)
Summary: EGD Instructions  [facility]  [address] [place], [place] [address]   Phone: [phone]  Fax: [phone]       Miguel Thomas    Feb 05, 1941    MRN: [id number]       Procedure Day /Date: Tuesday 01/06/2011     Arrival Time: 8:30 am     Procedure Time: 9:30 am     Location of Procedure:                    _ x _  Endoscopy Center (4th Floor)    PREPARATION FOR ENDOSCOPY   On Tuesday 3/6/2012THE DAY OF THE PROCEDURE:  1.   No solid foods, milk or milk products are allowed after midnight the night before your procedure.  2.   Do not drink anything colored red or purple.  Avoid juices with pulp.  No orange juice.  3.  You may drink clear liquids until 7:30 am, which is 2 hours before your procedure.                                                                                                CLEAR LIQUIDS INCLUDE: Water Jello Ice Popsicles Tea (sugar ok, no milk/cream) Powdered fruit flavored drinks Coffee (sugar ok, no milk/cream) Gatorade Juice: apple, white grape, white cranberry  Lemonade Clear bullion, consomm, broth Carbonated beverages (any kind) Strained chicken noodle soup Hard Candy   MEDICATION INSTRUCTIONS  Unless otherwise instructed, you should take regular prescription medications with a small sip of water as early as possible the morning of your procedure.          OTHER INSTRUCTIONS  You will need a responsible adult at least 72 years of age to accompany you and drive you home.   This person must remain in the waiting room during your procedure.  Wear loose fitting clothing that is easily removed.  Leave jewelry and other valuables at home.  However, you may wish to bring a book to read or an iPod/MP3 player to listen to music as you wait for your procedure to start.  Remove all body piercing jewelry and leave at home.  Total time from sign-in until discharge is approximately 2-3 hours.  You should go home  directly after your procedure and rest.  You can resume normal activities the day after your procedure.  The day of your procedure you should not:   Drive   Make legal decisions   Operate machinery   Drink alcohol   Return to work  You will receive specific instructions about eating, activities and medications before you leave.    The above instructions have been reviewed and explained to me by   Ulis Rias RN  December 30, 2010 8:12 AM     I fully understand and can verbalize these instructions _____________________________ Date _________

## 2011-01-13 ENCOUNTER — Encounter: Payer: Self-pay | Admitting: Internal Medicine

## 2011-01-13 NOTE — Procedures (Addendum)
Summary: Upper Endoscopy  Patient: [name] Note: All result statuses are Final unless otherwise noted.  Tests: (1) Upper Endoscopy (EGD)   EGD Upper Endoscopy       DONE     [facility]     [address]     [place], [place]  [address]          ENDOSCOPY PROCEDURE REPORT          PATIENT:  Miguel Thomas, Miguel Thomas  MR#:  [id number]     BIRTHDATE:  30-Sep-1941, 70 yrs. old  GENDER:  male          ENDOSCOPIST:  Iva Boop, MD, [facility]          PROCEDURE DATE:  01/06/2011     PROCEDURE:  EGD with biopsy, 19[address]     ASA CLASS:  Class I     INDICATIONS:  GERD follow-up intestinal metaplasia of cardia/GE     junction          MEDICATIONS:   Fentanyl 50 mcg IV, Versed 6 mg IV     TOPICAL ANESTHETIC:  Exactacain Spray          DESCRIPTION OF PROCEDURE:   After the risks benefits and     alternatives of the procedure were thoroughly explained, informed     consent was obtained.  The LB [id number]-H180 [id number] endoscope was     introduced through the mouth and advanced to the second portion of     the duodenum, without limitations.  The instrument was slowly     withdrawn as the mucosa was fully examined.     <<PROCEDUREIMAGES>>          Slightly irregular Z-line (40 cm) at the gastroesophageal     junction. Did not see the tongue of salmon mucosa seen last year     (on retroflex and forward views). Multiple biopsies were obtained     and sent to pathology.  A hiatal hernia was found. Sliding, 2-3 cm     (40-42-3 cm) as before.  Otherwise the examination was normal.     Retroflexed views revealed a hiatal hernia.    The scope was then     withdrawn from the patient and the procedure completed.          COMPLICATIONS:  None          ENDOSCOPIC IMPRESSION:     1) Irregular Z-line at the gastroesophageal junction     2) Hiatal hernia     3) Otherwise normal examination     RECOMMENDATIONS:     1) Await biopsy results     2) Continue omeprazole          Iva Boop, MD,  [facility]          CC:  The Patient          n.     eSIGNED:   Iva Boop at 01/06/2011 10:24 AM          Tish Men, [id number]  Note: An exclamation Keyandre (!) indicates a result that was not dispersed into the flowsheet. Document Creation Date: 01/06/2011 10:24 AM _______________________________________________________________________  (1) Order result status: Final Collection or observation date-time: 01/06/2011 10:15 Requested date-time:  Receipt date-time:  Reported date-time:  Referring Physician:   Ordering Physician: Stan Head [phone]) Specimen Source:  Source: Launa Grill Order Number: [id number] Lab site:

## 2011-01-20 NOTE — Letter (Signed)
Summary: Patient Notice-Endo Biopsy Results  [facility]  [address] [place], [place] [address]   Phone: [phone]  Fax: [phone]        January 13, 2011 MRN: [id number]    KARLA VINES [address] [place], [place]  [address]    Dear Mr. NEVINS,  I am pleased to inform you that the biopsies taken during your recent endoscopic examination did not show any evidence of the pre-cancerous changes identified previously. There was some mild microscopic inflammation only. You should continue your omeprazole and keep up the good work on your weight loss and diet.  No further action is needed at this time.  Please follow-up with      your primary care physician for your other healthcare needs.  Please call us if you are having persistent problems or have questions about your condition that have not been fully answered at this time.  Sincerely,  Iva Boop MD, [facility]  This letter has been electronically signed by your physician.  Appended Document: Patient Notice-Endo Biopsy Results letter mailed

## 2011-03-20 NOTE — Assessment & Plan Note (Signed)
[name]                           PRIMARY CARE OFFICE NOTE   NAME:Miguel Thomas, Miguel Thomas                         MRN:          [id number]  DATE:01/07/2007                            DOB:          05-29-1941    CHIEF COMPLAINT:  New patient to practice/possible right ear infection.   HISTORY OF PRESENT ILLNESS:  The patient is a 70 year old white male  with above complaints.  He has not had a primary care physician in the  past, periodically is followed by Dr. Katrinka Blazing of urgent care.   PAST MEDICAL HISTORY:  Significant for gastroesophageal reflux disease,  has had previously an endoscopy with Dr. Leone Payor in February 2006 which  revealed esophagitis and esophageal stricture.  The patient has been  doing fairly well with omeprazole 20 mg that he takes at bedtime.   He recently went for hearing aid check and audiologist noted changes in  his tympanic membrane and suggested followup with a physician.  He  denies any pain in his ear, there has been no fever.   PAST MEDICAL HISTORY SUMMARY:  1. Hernia repair in 1997.  2. Gastroesophageal reflux disease with esophageal stricture in 2006.  3. Status post tonsillectomy in 1952.  4. History of colon polyps.   CURRENT MEDICATIONS:  Omeprazole 20 mg once a day.   ALLERGIES TO MEDICATIONS:  None known.   SOCIAL HISTORY:  The patient is married.  This is his second marriage.  He has one son.  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   FAMILY HISTORY:  Does not know his mother's history - she died a week  after he was born.  Father deceased in his 67s, unknown cause.  He has a  total of seven brothers.  One brother noted to have MI.  Two brothers  with rheumatoid heart disease.   HABITS:  No alcohol, no tobacco, no recreational drug use.   REVIEW OF SYSTEMS:  No fevers, chills.  HEENT symptoms as noted above.  Denies any chest pain or dyspnea.  Heartburn symptoms, throat symptoms  controlled  on omeprazole.  All other systems negative.   PHYSICAL EXAMINATION:  VITAL SIGNS:  Height is 5 feet 10 inches, weight  is 206 pounds, temperature is 97.2, pulse is 64, BP is 128/78 in the  left arm in a seated position.  GENERAL:  The patient is a pleasant, well-developed, well-nourished 19-  year-old white male who appears his stated age.  HEENT:  Normocephalic, atraumatic.  Pupils are equal and reactive to  light bilaterally.  Extraocular motility was intact.  The patient was  anicteric.  Conjunctivae were within normal limits.  The patient has  some scaliness the top of his forehead.  Oropharyngeal exam was  unremarkable.  His right tympanic membrane was retracted, dull, and  yellowish in color.  Left TM was unremarkable.  NECK:  Supple.  No adenopathy, carotid bruit or thyromegaly.  CHEST:  Normal respiratory effort.  Chest was clear to auscultation  bilaterally.  No rhonchi, rales or wheezing.  CARDIOVASCULAR:  Regular rate  and rhythm.  No murmurs, rubs or gallops  appreciated.  ABDOMEN:  Soft, nontender, positive bowel sounds.  No organomegaly.  MUSCULOSKELETAL:  No clubbing, cyanosis, or edema.  Skin was warm and  dry.  The patient had intact and bounding pedis dorsalis pulses of his  lower extremities.   IMPRESSION/RECOMMENDATION:  1. Serous otitis media.  2. Gastroesophageal reflux disease with history of esophageal      stricture.  3. History of colon polyps.  4. Health maintenance.   RECOMMENDATIONS:  The patient was prescribed Ceftin 500 mg p.o. q.12h.  for the next 10 days.  He will follow up in approximately 2 weeks.  Before a followup visit, will obtain baseline labs, screen him for  diabetes, check his lipids, and also check PSA.     Barbette Hair. Artist Pais, DO  Electronically Signed    RDY/MedQ  DD: 01/07/2007  DT: 01/07/2007  Job #: [id number]

## 2011-05-08 ENCOUNTER — Emergency Department (HOSPITAL_COMMUNITY): Payer: Medicare Other

## 2011-05-08 ENCOUNTER — Observation Stay (HOSPITAL_COMMUNITY)
Admission: EM | Admit: 2011-05-08 | Discharge: 2011-05-10 | Disposition: A | Discharge status: Home / Self Care | Payer: Medicare Other | Attending: Internal Medicine | Admitting: Internal Medicine

## 2011-05-08 ENCOUNTER — Encounter (HOSPITAL_COMMUNITY): Payer: Self-pay

## 2011-05-08 DIAGNOSIS — D696 Thrombocytopenia, unspecified: Secondary | ICD-10-CM | POA: Insufficient documentation

## 2011-05-08 DIAGNOSIS — R42 Dizziness and giddiness: Secondary | ICD-10-CM | POA: Insufficient documentation

## 2011-05-08 DIAGNOSIS — Z79899 Other long term (current) drug therapy: Secondary | ICD-10-CM | POA: Insufficient documentation

## 2011-05-08 DIAGNOSIS — Z7982 Long term (current) use of aspirin: Secondary | ICD-10-CM | POA: Insufficient documentation

## 2011-05-08 DIAGNOSIS — K219 Gastro-esophageal reflux disease without esophagitis: Secondary | ICD-10-CM | POA: Insufficient documentation

## 2011-05-08 DIAGNOSIS — R112 Nausea with vomiting, unspecified: Secondary | ICD-10-CM | POA: Insufficient documentation

## 2011-05-08 DIAGNOSIS — R7309 Other abnormal glucose: Secondary | ICD-10-CM | POA: Insufficient documentation

## 2011-05-08 DIAGNOSIS — K5289 Other specified noninfective gastroenteritis and colitis: Principal | ICD-10-CM | POA: Insufficient documentation

## 2011-05-08 DIAGNOSIS — D649 Anemia, unspecified: Secondary | ICD-10-CM | POA: Insufficient documentation

## 2011-05-08 DIAGNOSIS — R9431 Abnormal electrocardiogram [ECG] [EKG]: Secondary | ICD-10-CM | POA: Insufficient documentation

## 2011-05-08 LAB — DIFFERENTIAL
Basophils Relative: 0 % (ref 0–1)
Eosinophils Absolute: 0 10*3/uL (ref 0.0–0.7)
Eosinophils Relative: 0 % (ref 0–5)
Lymphocytes Relative: 6 % — ABNORMAL LOW (ref 12–46)
Monocytes Absolute: 0.4 10*3/uL (ref 0.1–1.0)
Monocytes Relative: 5 % (ref 3–12)
Neutro Abs: 7.9 10*3/uL — ABNORMAL HIGH (ref 1.7–7.7)

## 2011-05-08 LAB — COMPREHENSIVE METABOLIC PANEL
ALT: 19 U/L (ref 0–53)
AST: 20 U/L (ref 0–37)
Albumin: 3.4 g/dL — ABNORMAL LOW (ref 3.5–5.2)
Alkaline Phosphatase: 70 U/L (ref 39–117)
CO2: 25 mEq/L (ref 19–32)
Sodium: 139 mEq/L (ref 135–145)

## 2011-05-08 LAB — CK TOTAL AND CKMB (NOT AT ARMC): Total CK: 61 U/L (ref 7–232)

## 2011-05-08 LAB — CBC
MCH: 31.3 pg (ref 26.0–34.0)
MCV: 91.5 fL (ref 78.0–100.0)

## 2011-05-09 LAB — BASIC METABOLIC PANEL
CO2: 27 mEq/L (ref 19–32)
Calcium: 8.3 mg/dL — ABNORMAL LOW (ref 8.4–10.5)
Creatinine, Ser: 0.68 mg/dL (ref 0.50–1.35)
GFR calc Af Amer: >60 mL/min (ref >60)
GFR calc non Af Amer: >60 mL/min (ref >60)
Glucose, Bld: 101 mg/dL — ABNORMAL HIGH (ref 70–99)
Potassium: 3.8 mEq/L (ref 3.5–5.1)
Sodium: 140 mEq/L (ref 135–145)

## 2011-05-09 LAB — FERRITIN: Ferritin: 164 ng/mL (ref 22–322)

## 2011-05-09 LAB — CARDIAC PANEL(CRET KIN+CKTOT+MB+TROPI)
CK, MB: 1.9 ng/mL (ref 0.3–4.0)
CK, MB: 2.1 ng/mL (ref 0.3–4.0)
Relative Index: INVALID (ref 0.0–2.5)
Total CK: 66 U/L (ref 7–232)
Total CK: 73 U/L (ref 7–232)
Troponin I: <0.3 ng/mL (ref <0.30)
Troponin I: <0.3 ng/mL (ref <0.30)

## 2011-05-09 LAB — LIPID PANEL
LDL Cholesterol: 103 mg/dL — ABNORMAL HIGH (ref 0–99)
Triglycerides: 50 mg/dL (ref <150)
VLDL: 10 mg/dL (ref 0–40)

## 2011-05-09 LAB — IRON AND TIBC
Iron: 85 ug/dL (ref 42–135)
UIBC: 178 ug/dL

## 2011-05-09 LAB — HEMOGLOBIN A1C
Hgb A1c MFr Bld: 5.4 % (ref <5.7)
Mean Plasma Glucose: 108 mg/dL (ref <117)

## 2011-05-09 LAB — FOLATE: Folate: 16.1 ng/mL

## 2011-05-09 NOTE — H&P (Signed)
NAMEGIRARD, KOONTZ NO.:  [id number]  MEDICAL RECORD NO.:  [id number]  LOCATION:  1305                         FACILITY:  [facility]  PHYSICIAN:  Hillery Aldo, M.D.   DATE OF BIRTH:  04/26/1941  DATE OF ADMISSION:  05/08/2011 DATE OF DISCHARGE:                             HISTORY & PHYSICAL   PRIMARY CARE PHYSICIAN:  Barbette Hair. Artist Pais, DO  CHIEF COMPLAINT:  Nausea with vomiting.  HISTORY OF PRESENT ILLNESS:  The patient is a 70 year old male who developed the sudden onset of dizziness last evening.  Later on in the night, this was followed by diarrhea x2 and nausea and vomiting x3 to 4 episodes.  The patient states that he ate pasta with sauce along with his wife last evening, but she has not had any similar symptoms. Earlier in the day, the patient had chilly from a fast food restaurant, but no one else ate the same thing.  He denies any frank fever, though he reports some chills earlier today.  He denies any recent treatment with antibiotics.  Denies any blood in the stools or black stools.  The patient states that lying still eases off his symptoms, but movement seems to worsen them.  Upon initial evaluation in the emergency department, the patient was given antiemetics, but continues to be symptomatic and therefore has been referred to the hospitalist service for further evaluation and treatment.  PAST MEDICAL HISTORY: 1. Gastroesophageal reflux disease status post upper endoscopy showing     esophagitis and esophageal stricture in the past. 2. Hearing loss. 3. History of colon polyps.  PAST SURGICAL HISTORY: 1. Right inguinal hernia repair. 2. Left shoulder surgery. 3. Bilateral cataracts. 4. Tonsillectomy.  CURRENT MEDICATIONS: 1. Aspirin daily. 2. Omeprazole 40 mg p.o. daily.  ALLERGIES:  No known drug allergies.  SOCIAL HISTORY:  The patient is on his second marriage and currently lives with his wife in [place].  He is a lifelong  nonsmoker.  Denies any alcohol or drug use.  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  FAMILY HISTORY:  The patient's mother died 1 week after he was born of uncertain causes.  The patient's father died at age 59 of uncertain causes.  He has 6 siblings.  He reports 3 of his brothers died, but is not entirely sure of the details.  He thinks May 05, 2024have died from cancer, another from rheumatic heart disease, and another from an MI. He has one healthy son.  REVIEW OF SYSTEMS:  CONSTITUTIONAL:  No fever or chills.  He has had a diminished appetite since his symptoms started, but normal appetite up until the onset of his illness.  He has had some intentional weight loss of over 20 pounds with dieting.  HEENT:  History of cataracts with lens implants.  Wears hearing aids.  CARDIOVASCULAR:  Denies any history of heart disease.  No chest pain or palpitations.  RESPIRATORY:  Denies any history of lung problems.  No dyspnea.  Occasional cough.  GI:  Positive for nausea, vomiting, diarrhea, but no melena or hematochezia.  Positive for lower abdominal pain.  GU:  No dysuria, hesitancy, or hematuria.  Comprehensive 14-point review of systems is otherwise unremarkable.  PHYSICAL EXAMINATION:  VITAL SIGNS:  Temperature 98.0, blood pressure 130/74, pulse 85, respirations 18, O2 saturation 98% on room air. GENERAL:  Well-developed, well-nourished male in no acute distress. HEENT:  Normocephalic, atraumatic.  PERRL.  EOMI.  Oropharynx is clear with fair dentition and moist mucous membranes.  NECK:  Supple, no thyromegaly, no lymphadenopathy, no jugular venous distention. CHEST:  Diminished breath sounds bilaterally, but otherwise clear. HEART:  Regular rate, rhythm.  No murmurs, rubs or gallops. ABDOMEN:  Soft, mildly tender to the lower quadrants.  Positive bowel sounds in all 4 quadrants. EXTREMITIES:  No clubbing, edema, or cyanosis. SKIN:  Warm, dry.  No rashes. NEUROLOGIC:  The  patient is alert and oriented x3.  Cranial nerves II- XII are grossly intact except for some hearing acuity loss.  Moves all extremities x4 with equal strength.  DATA REVIEW:  Chest x-ray shows no active disease with course and interstitial markings, likely chronic.  LABORATORY DATA:  Lipase is 21.  Sodium is 139, potassium 3.5, chloride 107, bicarb 25, BUN 15, creatinine 0.64, glucose 146, calcium 8.2, total bilirubin 0.8, alkaline phosphatase 70, AST 20, ALT 19, total protein 6.5, albumin 3.4.  White blood cell count is 8.9, hemoglobin 12.5, hematocrit 36.5, platelets 135.  12-lead EKG shows normal sinus rhythm at 74 beats per minute with a borderline interventricular conduction delay.  Probable old posterior infarction.  ASSESSMENT/PLAN: 1. Probable gastroenteritis:  We will admit the patient for 23-hour     observation, hydrate him for 2 L of IV fluids, provide antiemetics     p.r.n. and a clear liquid, diet which can be advanced as tolerated.     We will obtain stool studies including a stool culture and PCR for     Clostridium difficile.  If his symptoms do not resolve with     conservative therapy, would pursue imaging in the morning. 2. Hyperglycemia:  Not a fasting sample.  We will check hemoglobin A1c     to determine his overall glycemic control. 3. Normocytic anemia/thrombocytopenia:  We will check an anemia panel. 4. Gastroesophageal reflux disease:  Continue the patient's PPI     therapy. 5. EKG changes:  The patient is not endorsing any history of heart     problems and does not have any current chest pain.  Nevertheless,     with his history of feeling dizzy last night and his EKG changes,     will be prudent to cycle cardiac markers q.6 h. x3 sets.  We would     continue aspirin therapy daily.  If his enzymes are positive, will     need further cardiac evaluation. 6. Prophylaxis:  The patient is an ambulatory male with no significant     risk factors for venous  thromboembolism, therefore we will use PAS     hoses for deep vein thrombosis prophylaxis.  Time spent on admission including face-to-face time equals approximately 1 hour.     Hillery Aldo, M.D.     CR/MEDQ  D:  05/08/2011  T:  05/08/2011  Job:  [id number]  cc:   Barbette Hair. Dewy Rose, DO [address] [place], [place] [address]  Electronically Signed by Hillery Aldo M.D. on 05/09/2011 03:58:23 PM

## 2011-05-10 LAB — CBC
MCH: 32 pg (ref 26.0–34.0)
MCHC: 35 g/dL (ref 30.0–36.0)
Platelets: 144 10*3/uL — ABNORMAL LOW (ref 150–400)
RBC: 4.15 MIL/uL — ABNORMAL LOW (ref 4.22–5.81)
RDW: 13.3 % (ref 11.5–15.5)

## 2011-05-10 LAB — BASIC METABOLIC PANEL
Calcium: 8.3 mg/dL — ABNORMAL LOW (ref 8.4–10.5)
GFR calc Af Amer: >60 mL/min (ref >60)
GFR calc non Af Amer: >60 mL/min (ref >60)
Sodium: 140 mEq/L (ref 135–145)

## 2011-05-10 NOTE — Discharge Summary (Signed)
Miguel Thomas, Miguel Thomas                  ACCOUNT NO.:  [id number]  MEDICAL RECORD NO.:  [id number]  LOCATION:  1305                         FACILITY:  [facility], [place]  PHYSICIAN:  Hillery Aldo, M.D.   DATE OF BIRTH:  [birth year]  DATE OF ADMISSION:  05/08/2011 DATE OF DISCHARGE:  05/10/2011                              DISCHARGE SUMMARY   PRIMARY CARE PHYSICIAN:  Miguel Hair. Artist Pais, DO  DISCHARGE DIAGNOSES: 1. Probable viral gastroenteritis. 2. Hyperglycemia, transient 3. Normocytic anemia, transient 4. Gastroesophageal reflux disease. 5. Mild thrombocytopenia. 6. Nonspecific EKG changes.  DISCHARGE MEDICATIONS: 1. Zofran 4 mg p.o. q.6 h p.r.n. nausea. 2. Aveeno cream one application topically to scalp daily. 3. Omeprazole 40 mg p.o. daily.  CONSULTATIONS:  None.  BRIEF ADMISSION HPI:  The patient is a 70 year old male who presented to the hospital with a chief complaint of dizziness accompanied by nausea and vomiting.  Upon initial evaluation in the emergency department, the patient was given antiemetics but continued to be symptomatic and therefore was referred to the hospitalist service for further evaluation and treatment.  For the full details, please see my dictated H and P.  PROCEDURES AND DIAGNOSTIC STUDIES:  Two views of the chest on May 08, 2011, showed no further visualization of central pulmonary nodule and no active disease identified.  DISCHARGE LABORATORY VALUES:  Sodium was 140, potassium 3.6, chloride 106, bicarb 29, BUN 11, creatinine 0.81, glucose 88, calcium 8.3.  White blood cell count was 7.1, hemoglobin 13.3, hematocrit 38.0, platelets 144.  Lipids showed a cholesterol of 161, triglycerides 50, HDL 48, LDL 103.  An anemia panel was completely normal with an iron of 85, total iron binding capacity of 263, 32% saturation, urine iron-binding capacity 178, vitamin B12 of 388, serum folate 16.1, ferritin was 64. Hemoglobin A1c was 5.4.  Cardiac markers were cycled q.6 h  x3 sets and were completely negative.  Lipase was 21.  Liver function studies done on admission were normal with the exception of a low albumin of 3.4.  HOSPITAL COURSE BY PROBLEM: 1. Probable viral gastroenteritis:  The patient was admitted and     treated symptomatically with antiemetics and IV fluids.  On the     morning after his admission, he was continuing to have episodes of     vomiting and therefore he was not discharged on hospital day #1.     With ongoing treatment including IV fluids and antiemetics, his     symptoms gradually resolved and by the evening of May 09, 2011, his     diet was able to be advanced and he subsequently tolerated this.     At this point, his symptoms were most likely due to a viral     gastroenteritis with no further follow-up needed. 2. Transient hyperglycemia:  This was a nonfasting sample.  Hemoglobin     A1c was subsequently checked and found to be within normal limits. 3. Transient normocytic anemia:  The patient's hemoglobin on admission     was 12.5.  Due to his concurrent mild thrombocytopenia, an anemia     panel was done and was completely normal.  His discharge hemoglobin  is normal. 4. Gastroesophageal reflux disease:  The patient was maintained on     proton pump inhibitor therapy which was changed the IV route while     he was in the hospital. 5. Thrombocytopenia:  This is mild and again anemia panel has been     checked and found to be normal. 6. EKG changes:  The patient's initial EKG readings showed sinus     rhythm with borderline interventricular conduction delay and a     probable posterior infarction.  There were no frank ST or T-wave     abnormalities appreciated but because of his risk factor profile     (male sex, age), a fasting lipid panel was done to further risk     stratify and he was ruled out for acute coronary syndrome with     serial markers.  The patient can follow up with his primary care     physician regarding  his EKG changes with a stress test if deemed     appropriate.  DISPOSITION:  The patient is medically stable and will be discharged home.  CONDITION ON DISCHARGE:  Improved.  DISCHARGE INSTRUCTIONS:  ACTIVITY:  No restrictions.  DIET:  As tolerated, heart healthy.  FOLLOW-UP:  With Dr. Artist Thomas in 2 to 3 weeks or sooner if needed.  Time spent coordinating care for discharge and discharge instructions equals approximately 25 minutes.     Hillery Aldo, M.D.     CR/MEDQ  D:  05/10/2011  T:  05/10/2011  Job:  [id number]  cc:   Miguel Hair. Ackworth, DO [address] [place], [place] [address]  Electronically Signed by Hillery Aldo M.D. on 05/10/2011 02:45:13 PM

## 2011-05-21 ENCOUNTER — Encounter: Payer: Self-pay | Admitting: Family

## 2011-06-02 ENCOUNTER — Encounter: Payer: Self-pay | Admitting: Family

## 2011-06-02 ENCOUNTER — Ambulatory Visit (INDEPENDENT_AMBULATORY_CARE_PROVIDER_SITE_OTHER): Payer: Medicare Other | Admitting: Family

## 2011-06-02 DIAGNOSIS — Z8619 Personal history of other infectious and parasitic diseases: Secondary | ICD-10-CM

## 2011-06-02 DIAGNOSIS — R9431 Abnormal electrocardiogram [ECG] [EKG]: Secondary | ICD-10-CM

## 2011-06-02 NOTE — Assessment & Plan Note (Signed)
This is completely resolved.  Hospitalization records reviewed.

## 2011-06-02 NOTE — Patient Instructions (Signed)
You will be contacted about your stress test.   Follow up in 6 months, sooner if problems or concerns.

## 2011-06-02 NOTE — Assessment & Plan Note (Signed)
Pt had abnormal EKG in hospital (? Posterior infarct).  Will refer for stress test.

## 2011-06-02 NOTE — Progress Notes (Signed)
  Subjective:    Patient ID: Miguel Thomas, male    DOB: Mar 31, 1941, 70 y.o.   MRN: [id number]  HPI  Mr.  Thomas is a 70 year old male who presents today for hospital follow up.  He was seen at [facility] and admitted for acute gastroenteritis on 7/8.  He was hospitalized for 2 days.  Initially had nausea, vomitting and diarrhea.  He reports complete resolution of his symptoms at this time.  Abnormal EKG- he was noted to have abnormal EKG while in the hospital.  Ruled out for MI.  Denies chest pain, shortness or breath or LE edema.  One of his older brothers had MI.  Pt is a non-smoker.      Review of Systems See HPI  Past Medical History  Diagnosis Date  . Diverticulosis 12-12-04  . Colon polyp 12-12-04    adenomatous polyp neg for high grade dysplasia or malignancy  . GERD (gastroesophageal reflux disease) 12-12-04    ring like esophageal stricture with reflux esophagitis, also in 2011  . Hearing loss   . Adenomatous colon polyp     History   Social History  . Marital Status: Married    Spouse Name: N/A    Number of Children: N/A  . Years of Education: N/A   Occupational History  . Not on file.   Social History Main Topics  . Smoking status: Never Smoker   . Smokeless tobacco: Not on file  . Alcohol Use: No  . Drug Use:   . Sexually Active:    Other Topics Concern  . Not on file   Social History Narrative   Married (2nd marriage)Daily Caffeine- use up to 6/ daily    Past Surgical History  Procedure Date  . Hernia repair   . Tonsillectomy     Family History  Problem Relation Age of Onset  . Coronary artery disease Other   . Heart disease Other     rheumatic    No Known Allergies  Current Outpatient Prescriptions on File Prior to Visit  Medication Sig Dispense Refill  . omeprazole (PRILOSEC) 40 MG capsule Take 40 mg by mouth daily.          BP 130/82  Pulse 78  Temp(Src) 97.9 F (36.6 C) (Oral)  Resp 16  Ht 5\' 10"  (1.778 m)  Wt 183 lb 0.6 oz (83.026 kg)   BMI 26.26 kg/m2       Objective:   Physical Exam  Constitutional: He appears well-developed and well-nourished.  Cardiovascular: Normal rate and regular rhythm.   No murmur heard. Pulmonary/Chest: Effort normal.  Abdominal: Soft. Bowel sounds are normal.  Musculoskeletal: He exhibits no edema.  Psychiatric: He has a normal mood and affect. His behavior is normal. Judgment and thought content normal.          Assessment & Plan:   No problem-specific assessment & plan notes found for this encounter.

## 2011-06-11 ENCOUNTER — Ambulatory Visit (HOSPITAL_COMMUNITY): Payer: Medicare Other | Attending: Cardiology | Admitting: Radiology

## 2011-06-11 VITALS — Ht 70.0 in | Wt 180.0 lb

## 2011-06-11 DIAGNOSIS — R0989 Other specified symptoms and signs involving the circulatory and respiratory systems: Secondary | ICD-10-CM

## 2011-06-11 DIAGNOSIS — R9431 Abnormal electrocardiogram [ECG] [EKG]: Secondary | ICD-10-CM | POA: Insufficient documentation

## 2011-06-11 DIAGNOSIS — I4949 Other premature depolarization: Secondary | ICD-10-CM

## 2011-06-11 DIAGNOSIS — R0609 Other forms of dyspnea: Secondary | ICD-10-CM

## 2011-06-11 HISTORY — PX: CARDIOVASCULAR STRESS TEST: SHX262

## 2011-06-11 MED ORDER — TECHNETIUM TC 99M TETROFOSMIN IV KIT
33.0000 | PACK | Freq: Once | INTRAVENOUS | Status: AC | PRN
  Administered 2011-06-11: 33 via INTRAVENOUS

## 2011-06-11 MED ORDER — REGADENOSON 0.4 MG/5ML IV SOLN
0.4000 mg | Freq: Once | INTRAVENOUS | Status: AC
Start: 2011-06-11 — End: 2011-06-11
  Administered 2011-06-11: 0.4 mg via INTRAVENOUS

## 2011-06-11 MED ORDER — TECHNETIUM TC 99M TETROFOSMIN IV KIT
11.0000 | PACK | Freq: Once | INTRAVENOUS | Status: AC | PRN
  Administered 2011-06-11: 11 via INTRAVENOUS

## 2011-06-11 NOTE — Progress Notes (Signed)
[facility] SITE 3 NUCLEAR MED [address] [place] [address] [phone]  Cardiology Nuclear Med Study  Miguel Thomas is a 70 y.o. male [id number] [birth year]   Nuclear Med Background Indication for Stress Test:  Evaluation for Ischemia and 05/10/11 ED: Acute gastroenteritis and Abnormal EKG, MI ruled out History:  No previous documented CAD Cardiac Risk Factors: Family History - CAD  Symptoms:  Dizziness, DOE, Nausea and Vomiting   Nuclear Pre-Procedure Caffeine/Decaff Intake:  None NPO After:  6:00pm   Lungs:  Clear IV 0.9% NS with Angio Cath:  20g  IV Site: R Antecubital  IV Started by:  Stanton Kidney, EMT-P  Chest Size (in):  40 Cup Size: n/a  Height: 5\' 10"  (1.778 m)  Weight:  180 lb (81.647 kg)  BMI:  Body mass index is 25.83 kg/(m^2). Tech Comments:  NA    Nuclear Med Study 1 or 2 day study: 1 day  Stress Test Type:  Treadmill/Lexiscan  Reading MD: Cassell Clement, MD  Order Authorizing Provider:  Charlynn Court, MD  Resting Radionuclide: Technetium 35m Tetrofosmin  Resting Radionuclide Dose: 11.0 mCi   Stress Radionuclide:  Technetium 27m Tetrofosmin  Stress Radionuclide Dose: 33.0 mCi           Stress Protocol Rest HR: 54 Stress HR: 115  Rest BP: 127/70 Stress BP: 179/91  Exercise Time (min): 2:00 METS: n/a   Predicted Max HR: 148 bpm % Max HR: 77.7 bpm Rate Pressure Product: 46962   Dose of Adenosine (mg):  n/a Dose of Lexiscan: 0.4 mg  Dose of Atropine (mg): n/a Dose of Dobutamine: n/a mcg/kg/min (at max HR)  Stress Test Technologist: Irean Hong, RN  Nuclear Technologist:  Doyne Keel, CNMT     Rest Procedure:  Myocardial perfusion imaging was performed at rest 45 minutes following the intravenous administration of Technetium 39m Tetrofosmin. Rest ECG: SB with sinus arrhythmia  Stress Procedure:  The patient received IV Lexiscan 0.4 mg over 15-seconds with concurrent low level exercise. Technetium 16m Tetrofosmin was injected at  30-seconds while the patient continued walking.   There were no significant changes with Lexiscan. There were frequent PJC's, occasional PAC, PVC, and sinus arrhythmia. Quantitative spect images were obtained after a 45-minute delay. Stress ECG: No significant ST segment change suggestive of ischemia.  QPS Raw Data Images:  Normal; no motion artifact; normal heart/lung ratio. Stress Images:  Normal homogeneous uptake in all areas of the myocardium. Rest Images:  Normal homogeneous uptake in all areas of the myocardium. Subtraction (SDS):  No evidence of ischemia. Transient Ischemic Dilatation (Normal <1.22):  1.00 Lung/Heart Ratio (Normal <0.45):  0.31  Quantitative Gated Spect Images QGS EDV:  104 ml QGS ESV:  44 ml QGS cine images:  NL LV Function; NL Wall Motion QGS EF: 58%  Impression Exercise Capacity:  Lexiscan with low level exercise. BP Response:  Normal blood pressure response. Clinical Symptoms:  No chest pain. ECG Impression:  No significant ST segment change suggestive of ischemia. Comparison with Prior Nuclear Study: No previous nuclear study performed  Overall Impression:  Normal stress nuclear study.     Cassell Clement

## 2011-06-12 NOTE — Progress Notes (Signed)
Nuclear report routed to Dr. Gerhard Perches. Deshanta Lady, Miguel Thomas

## 2011-06-16 ENCOUNTER — Telehealth: Payer: Self-pay | Admitting: Family

## 2011-06-16 NOTE — Telephone Encounter (Signed)
pts spouse Rosey Bath informed

## 2011-06-16 NOTE — Telephone Encounter (Signed)
Please call pt and let him know that his stress test was negative.

## 2011-11-03 HISTORY — PX: PROSTATE BIOPSY: SHX241

## 2011-11-20 DIAGNOSIS — R972 Elevated prostate specific antigen [PSA]: Secondary | ICD-10-CM | POA: Diagnosis not present

## 2011-11-20 DIAGNOSIS — N402 Nodular prostate without lower urinary tract symptoms: Secondary | ICD-10-CM | POA: Diagnosis not present

## 2012-02-22 ENCOUNTER — Other Ambulatory Visit: Payer: Self-pay | Admitting: Internal Medicine

## 2012-03-18 DIAGNOSIS — Z961 Presence of intraocular lens: Secondary | ICD-10-CM | POA: Diagnosis not present

## 2012-06-24 ENCOUNTER — Ambulatory Visit (INDEPENDENT_AMBULATORY_CARE_PROVIDER_SITE_OTHER): Payer: Medicare Other | Admitting: Family

## 2012-06-24 ENCOUNTER — Telehealth: Payer: Self-pay | Admitting: *Deleted

## 2012-06-24 ENCOUNTER — Encounter: Payer: Self-pay | Admitting: Family

## 2012-06-24 VITALS — BP 128/72 | HR 57 | Temp 97.6°F | Ht 70.0 in | Wt 185.8 lb

## 2012-06-24 DIAGNOSIS — Z13820 Encounter for screening for osteoporosis: Secondary | ICD-10-CM

## 2012-06-24 DIAGNOSIS — E785 Hyperlipidemia, unspecified: Secondary | ICD-10-CM | POA: Diagnosis not present

## 2012-06-24 DIAGNOSIS — Z0000 Encounter for general adult medical examination without abnormal findings: Secondary | ICD-10-CM | POA: Diagnosis not present

## 2012-06-24 DIAGNOSIS — K219 Gastro-esophageal reflux disease without esophagitis: Secondary | ICD-10-CM

## 2012-06-24 DIAGNOSIS — R7309 Other abnormal glucose: Secondary | ICD-10-CM | POA: Diagnosis not present

## 2012-06-24 DIAGNOSIS — D649 Anemia, unspecified: Secondary | ICD-10-CM

## 2012-06-24 DIAGNOSIS — R6889 Other general symptoms and signs: Secondary | ICD-10-CM | POA: Diagnosis not present

## 2012-06-24 DIAGNOSIS — L989 Disorder of the skin and subcutaneous tissue, unspecified: Secondary | ICD-10-CM | POA: Insufficient documentation

## 2012-06-24 DIAGNOSIS — R739 Hyperglycemia, unspecified: Secondary | ICD-10-CM

## 2012-06-24 LAB — CBC WITH DIFFERENTIAL/PLATELET
Basophils Absolute: 0 10*3/uL (ref 0.0–0.1)
Basophils Relative: 1 % (ref 0–1)
Lymphocytes Relative: 30 % (ref 12–46)
Neutro Abs: 3.3 10*3/uL (ref 1.7–7.7)
Platelets: 206 10*3/uL (ref 150–400)
RDW: 13.6 % (ref 11.5–15.5)
WBC: 5.8 10*3/uL (ref 4.0–10.5)

## 2012-06-24 LAB — BASIC METABOLIC PANEL WITH GFR
Chloride: 105 mEq/L (ref 96–112)
Creat: 0.83 mg/dL (ref 0.50–1.35)
GFR, Est African American: >89 mL/min
GFR, Est Non African American: 87 mL/min
Potassium: 4.2 mEq/L (ref 3.5–5.3)

## 2012-06-24 LAB — TSH: TSH: 2.279 u[IU]/mL (ref 0.350–4.500)

## 2012-06-24 LAB — LIPID PANEL
HDL: 52 mg/dL (ref >39)
Triglycerides: 64 mg/dL (ref <150)

## 2012-06-24 NOTE — Assessment & Plan Note (Signed)
Nail bed- ? Trauma.  The discoloration is dark black.  I would like dermatology to look at this. Need to rule out melanoma.

## 2012-06-24 NOTE — Progress Notes (Signed)
Subjective:    Patient ID: Miguel Thomas, male    DOB: December 04, 1940, 71 y.o.   MRN: [id number]  HPI  Subjective:   Patient here for Medicare annual wellness visit and management of other chronic and acute problems.  GERD/esophageal stricture- Denies current reflux problems with omeprazole.   Colon Polyps- last colo 2011.  Leone Payor.  Hx abnormal EKG- pt noted last summer to have abnormal EKG. This was followed by a negative nuclear stress test.   Prostate biopsy November- Sees Urologist at [facility], says that gi set up up due an abnormal prostate exam. Per pt biopsy was negative. Urology is monitoring him once a year.    Darkening of nail bed- right middle finger- pt thinks he may have had trauma to this area, but he does not remember for sure.    Preventative-  Reports that he had zostavax [facility] about 18 months .  Risk factors: none   Roster of Physicians Providing Medical Care to Patient: Dr. Leone Payor Dr. Urologist ([facility])  Activities of Daily Living  In your present state of health, do you have any difficulty performing the following activities? Preparing food and eating?: No  Bathing yourself: No  Getting dressed: No  Using the toilet:No  Moving around from place to place: No  In the past year have you fallen or had a near fall?:No     Home Safety: Has smoke detector and wears seat belts. No firearms. No excess sun exposure.  Diet and Exercise  Current exercise habits: Not recently.  Dietary issues discussed: healthy diet- wife watches what they cook.    Depression Screen  (Note: if answer to either of the following is "Yes", then a more complete depression screening is indicated)  Q1: Over the past two weeks, have you felt down, depressed or hopeless?no  Q2: Over the past two weeks, have you felt little interest or pleasure in doing things? No.  Enjoys painting.    The following portions of the patient's history were reviewed and updated as appropriate:  allergies, current medications, past family history, past medical history, past social history, past surgical history and problem list.    Objective:   Vision:had eye exam within last 5-6 month. See nursing Hearing:  Unable to hear forced whisper at 6 feet.  Wears hearing aids. Body mass index: see vitals Cognitive Impairment Assessment: cognition, memory and judgment appear normal.   Assessment:   Medicare wellness utd on preventive parameters   Plan:    During the course of the visit the patient was educated and counseled about appropriate screening and preventive services including:         Fall prevention   Bone densitometry screening - never, would like to complete.   Diabetes screening  Nutrition counseling   Vaccines / LABS Patient Instructions (the written plan) was given to the patient.    Past Medical History  Diagnosis Date  . Diverticulosis 12-12-04  . Colon polyp 12-12-04    adenomatous polyp neg for high grade dysplasia or malignancy  . GERD (gastroesophageal reflux disease) 12-12-04    ring like esophageal stricture with reflux esophagitis, also in 2011  . Hearing loss   . Adenomatous colon polyp     History   Social History  . Marital Status: Married    Spouse Name: N/A    Number of Children: N/A  . Years of Education: N/A   Occupational History  . Not on file.   Social History Main Topics  .  Smoking status: Never Smoker   . Smokeless tobacco: Not on file  . Alcohol Use: No  . Drug Use:   . Sexually Active:    Other Topics Concern  . Not on file   Social History Narrative   Married (2nd marriage)Daily Caffeine- use up to 6/ dailyRecently laid off from his job 2013    Past Surgical History  Procedure Date  . Hernia repair   . Tonsillectomy     Family History  Problem Relation Age of Onset  . Coronary artery disease Other   . Heart disease Other     rheumatic    No Known Allergies  Current Outpatient Prescriptions on File Prior to  Visit  Medication Sig Dispense Refill  . omeprazole (PRILOSEC) 40 MG capsule 1 EACH DAY 30 MINUTES BEFORE MEAL  30 capsule  5    BP 128/72  Pulse 57  Temp 97.6 F (36.4 C) (Oral)  Ht 5\' 10"  (1.778 m)  Wt 185 lb 12.8 oz (84.278 kg)  BMI 26.66 kg/m2  SpO2 95%     Review of Systems     Objective:   Physical Exam  Physical Exam  Constitutional: He is oriented to person, place, and time. He appears well-developed and well-nourished. No distress.  HENT:  Head: Normocephalic and atraumatic.  Right Ear: Tympanic membrane and ear canal normal.  Left Ear: Tympanic membrane and ear canal normal.  Mouth/Throat: Oropharynx is clear and moist.  Eyes: Pupils are equal, round, and reactive to light. No scleral icterus.  Neck: Normal range of motion. No thyromegaly present.  Cardiovascular: Normal rate and regular rhythm.   No murmur heard. Pulmonary/Chest: Effort normal and breath sounds normal. No respiratory distress. He has no wheezes. He has no rales. He exhibits no tenderness.  Abdominal: Soft. Bowel sounds are normal. He exhibits no distension and no mass. There is no tenderness. There is no rebound and no guarding.  Musculoskeletal: He exhibits no edema.  Lymphadenopathy:    He has no cervical adenopathy.  Neurological: He is alert and oriented to person, place, and time. He has normal reflexes. He exhibits normal muscle tone. Coordination normal.  Skin: Skin is warm and dry. Pt is noted to have black hyperpigmentation of nail bed right ring finger. Psychiatric: He has a normal mood and affect. His behavior is normal. Judgment and thought content normal.          Assessment & Plan:         Assessment & Plan:

## 2012-06-24 NOTE — Telephone Encounter (Signed)
Entered zostavax.. 06/24/12@10 :48am/LMB

## 2012-06-24 NOTE — Patient Instructions (Addendum)
You will be contact about your referral to dermatology.  Please let us know if you have not heard back within 1 week about your referral. Please schedule your bone density at the front desk. Please complete your labs prior to leaving.  Follow up in 1 year, sooner if problems/concerns.

## 2012-06-24 NOTE — Assessment & Plan Note (Signed)
Stable on ppi, continue same.  

## 2012-06-24 NOTE — Telephone Encounter (Signed)
Message copied by Deatra James on Fri Jun 24, 2012 10:47 AM ------      Message from: O'SULLIVAN, MELISSA      Created: Fri Jun 24, 2012 10:30 AM       Could you pls document historical zostavax 18 months ago? thanks

## 2012-06-28 ENCOUNTER — Telehealth: Payer: Self-pay | Admitting: *Deleted

## 2012-06-28 ENCOUNTER — Ambulatory Visit (INDEPENDENT_AMBULATORY_CARE_PROVIDER_SITE_OTHER)
Admission: RE | Admit: 2012-06-28 | Discharge: 2012-06-28 | Disposition: A | Discharge status: Home / Self Care | Payer: Medicare Other | Source: Ambulatory Visit

## 2012-06-28 DIAGNOSIS — Z13820 Encounter for screening for osteoporosis: Secondary | ICD-10-CM | POA: Diagnosis not present

## 2012-06-28 DIAGNOSIS — E7800 Pure hypercholesterolemia, unspecified: Secondary | ICD-10-CM

## 2012-06-28 NOTE — Telephone Encounter (Signed)
Message copied by Kathi Simpers on Tue Jun 28, 2012  4:06 PM ------      Message from: O'SULLIVAN, MELISSA      Created: Tue Jun 28, 2012  2:11 PM       Pls call pt and let him know that cholesterol is up since last year.  Total cholesterol was 161 now up to 223.  He should work hard on low fat/low cholesterol diet and exercise.  Plan to repeat flp in 6 months (hyperlipidemia)      Kidney function/blood count, sugar are normal.

## 2012-06-28 NOTE — Telephone Encounter (Signed)
Notified pt of instructions below and mailed low cholesterol guideline. Future lab order entered for 12/2012 and copy mailed to pt as reminder.

## 2012-07-06 ENCOUNTER — Telehealth: Payer: Self-pay | Admitting: Family

## 2012-07-06 ENCOUNTER — Encounter: Payer: Self-pay | Admitting: Family

## 2012-07-06 DIAGNOSIS — M8580 Other specified disorders of bone density and structure, unspecified site: Secondary | ICD-10-CM

## 2012-07-06 NOTE — Telephone Encounter (Signed)
Please call pt and let him know that his bone density shows mild bone thinning. I would like him to start caltrate 600mg  +D bid and make sure that he is getting regular exercise such as walking. We will plan to repeat bone density in 2 years.  Also, at his convenience I would like to check a vitamin D level (diagnosis osteopenia).

## 2012-07-06 NOTE — Telephone Encounter (Signed)
Notified pt. Future lab order placed and copy given to the lab. Pt will return within the next week.

## 2012-07-12 DIAGNOSIS — M899 Disorder of bone, unspecified: Secondary | ICD-10-CM | POA: Diagnosis not present

## 2012-07-12 NOTE — Addendum Note (Signed)
Addended by: Mervin Kung A on: 07/12/2012 11:44 AM   Modules accepted: Orders

## 2012-07-12 NOTE — Telephone Encounter (Signed)
Pt presented to the lab, vit d level released.

## 2012-07-13 ENCOUNTER — Encounter: Payer: Self-pay | Admitting: Family

## 2012-07-13 LAB — VITAMIN D 25 HYDROXY (VIT D DEFICIENCY, FRACTURES): Vit D, 25-Hydroxy: 35 ng/mL (ref 30–89)

## 2012-07-16 DIAGNOSIS — Z23 Encounter for immunization: Secondary | ICD-10-CM | POA: Diagnosis not present

## 2012-07-27 DIAGNOSIS — L608 Other nail disorders: Secondary | ICD-10-CM | POA: Diagnosis not present

## 2012-07-27 DIAGNOSIS — D2330 Other benign neoplasm of skin of unspecified part of face: Secondary | ICD-10-CM | POA: Diagnosis not present

## 2012-09-03 ENCOUNTER — Other Ambulatory Visit: Payer: Self-pay | Admitting: Internal Medicine

## 2012-11-25 DIAGNOSIS — N400 Enlarged prostate without lower urinary tract symptoms: Secondary | ICD-10-CM | POA: Diagnosis not present

## 2012-11-30 DIAGNOSIS — N400 Enlarged prostate without lower urinary tract symptoms: Secondary | ICD-10-CM | POA: Diagnosis not present

## 2012-11-30 DIAGNOSIS — N4340 Spermatocele of epididymis, unspecified: Secondary | ICD-10-CM | POA: Diagnosis not present

## 2012-11-30 DIAGNOSIS — N402 Nodular prostate without lower urinary tract symptoms: Secondary | ICD-10-CM | POA: Diagnosis not present

## 2012-12-30 DIAGNOSIS — E7800 Pure hypercholesterolemia, unspecified: Secondary | ICD-10-CM | POA: Diagnosis not present

## 2012-12-30 NOTE — Telephone Encounter (Signed)
Pt presented to the lab, future order released. 

## 2012-12-30 NOTE — Addendum Note (Signed)
Addended by: Mervin Kung A on: 12/30/2012 12:02 PM   Modules accepted: Orders

## 2012-12-31 LAB — LIPID PANEL
HDL: 46 mg/dL (ref >39)
Total CHOL/HDL Ratio: 4 Ratio
Triglycerides: 60 mg/dL (ref <150)

## 2013-01-01 ENCOUNTER — Encounter: Payer: Self-pay | Admitting: Family

## 2013-04-01 ENCOUNTER — Other Ambulatory Visit: Payer: Self-pay | Admitting: Internal Medicine

## 2013-04-17 DIAGNOSIS — Z961 Presence of intraocular lens: Secondary | ICD-10-CM | POA: Diagnosis not present

## 2013-05-09 DIAGNOSIS — L570 Actinic keratosis: Secondary | ICD-10-CM | POA: Diagnosis not present

## 2013-05-09 DIAGNOSIS — D235 Other benign neoplasm of skin of trunk: Secondary | ICD-10-CM | POA: Diagnosis not present

## 2013-05-09 DIAGNOSIS — L819 Disorder of pigmentation, unspecified: Secondary | ICD-10-CM | POA: Diagnosis not present

## 2013-05-09 DIAGNOSIS — B079 Viral wart, unspecified: Secondary | ICD-10-CM | POA: Diagnosis not present

## 2013-05-19 ENCOUNTER — Other Ambulatory Visit: Payer: Self-pay | Admitting: Internal Medicine

## 2013-06-24 ENCOUNTER — Other Ambulatory Visit: Payer: Self-pay | Admitting: Internal Medicine

## 2013-06-26 ENCOUNTER — Ambulatory Visit: Payer: Medicare Other | Admitting: Family

## 2013-06-26 ENCOUNTER — Encounter: Payer: Self-pay | Admitting: Internal Medicine

## 2013-06-26 DIAGNOSIS — Z0289 Encounter for other administrative examinations: Secondary | ICD-10-CM

## 2013-07-15 ENCOUNTER — Other Ambulatory Visit: Payer: Self-pay | Admitting: Internal Medicine

## 2013-07-24 ENCOUNTER — Ambulatory Visit (INDEPENDENT_AMBULATORY_CARE_PROVIDER_SITE_OTHER): Payer: Medicare Other | Admitting: Internal Medicine

## 2013-07-24 ENCOUNTER — Encounter: Payer: Self-pay | Admitting: Internal Medicine

## 2013-07-24 VITALS — BP 120/70 | HR 80 | Ht 70.0 in | Wt 184.0 lb

## 2013-07-24 DIAGNOSIS — Z860100 Personal history of colon polyps, unspecified: Secondary | ICD-10-CM

## 2013-07-24 DIAGNOSIS — K319 Disease of stomach and duodenum, unspecified: Secondary | ICD-10-CM

## 2013-07-24 DIAGNOSIS — K219 Gastro-esophageal reflux disease without esophagitis: Secondary | ICD-10-CM | POA: Diagnosis not present

## 2013-07-24 DIAGNOSIS — K222 Esophageal obstruction: Secondary | ICD-10-CM

## 2013-07-24 MED ORDER — OMEPRAZOLE 40 MG PO CPDR: 40.0000 mg | DELAYED_RELEASE_CAPSULE | Freq: Every day | ORAL | Status: DC

## 2013-07-24 NOTE — Assessment & Plan Note (Signed)
Asymptomatic - continue omeprazole 40 mg daily - may refill up to 2 yrs if ok

## 2013-07-24 NOTE — Assessment & Plan Note (Addendum)
Stable - refill omeprazole Reduce caffeine - diet info given

## 2013-07-24 NOTE — Progress Notes (Signed)
  Subjective:    Patient ID: Miguel Thomas, male    DOB: November 07, 1940, 72 y.o.   MRN: [id number]  HPI The patient is here for follow-up of GERD - erosive esophagitis and prior stricture. He has no heartburn or dysphagia. Doing well overall. Is drinking 6-8 cups coffee daily Medications, allergies, past medical history, past surgical history, family history and social history are reviewed and updated in the EMR.  Review of Systems As above    Objective:   Physical Exam WDWN NAD     Assessment & Plan:  GERD  ESOPHAGEAL STRICTURE

## 2013-07-24 NOTE — Patient Instructions (Addendum)
We have sent the following medications to your pharmacy for you to pick up at your convenience: Refilled your omeprazole  Please get a flu shot this year.  Cut back on your coffee intake, we have given you a GERD diet handout to read.  Follow up with your PCP before the end of the year.     I appreciate the opportunity to care for you.

## 2013-08-02 DIAGNOSIS — Z23 Encounter for immunization: Secondary | ICD-10-CM | POA: Diagnosis not present

## 2013-08-09 ENCOUNTER — Encounter: Payer: Self-pay | Admitting: Physician Assistant

## 2013-08-09 ENCOUNTER — Ambulatory Visit (INDEPENDENT_AMBULATORY_CARE_PROVIDER_SITE_OTHER): Payer: Medicare Other | Admitting: Physician Assistant

## 2013-08-09 VITALS — BP 126/80 | HR 64 | Temp 97.9°F | Resp 16 | Ht 70.0 in | Wt 186.2 lb

## 2013-08-09 DIAGNOSIS — R7309 Other abnormal glucose: Secondary | ICD-10-CM

## 2013-08-09 DIAGNOSIS — E785 Hyperlipidemia, unspecified: Secondary | ICD-10-CM

## 2013-08-09 DIAGNOSIS — D649 Anemia, unspecified: Secondary | ICD-10-CM | POA: Diagnosis not present

## 2013-08-09 DIAGNOSIS — R739 Hyperglycemia, unspecified: Secondary | ICD-10-CM

## 2013-08-09 LAB — BASIC METABOLIC PANEL
CO2: 30 mEq/L (ref 19–32)
Calcium: 9 mg/dL (ref 8.4–10.5)
Chloride: 106 mEq/L (ref 96–112)
Glucose, Bld: 90 mg/dL (ref 70–99)
Potassium: 4.1 mEq/L (ref 3.5–5.3)
Sodium: 140 mEq/L (ref 135–145)

## 2013-08-09 LAB — CBC WITH DIFFERENTIAL/PLATELET
Basophils Absolute: 0 10*3/uL (ref 0.0–0.1)
Basophils Relative: 0 % (ref 0–1)
Eosinophils Absolute: 0.3 10*3/uL (ref 0.0–0.7)
Eosinophils Relative: 5 % (ref 0–5)
HCT: 40.1 % (ref 39.0–52.0)
Lymphocytes Relative: 29 % (ref 12–46)
MCHC: 34.2 g/dL (ref 30.0–36.0)
MCV: 91.1 fL (ref 78.0–100.0)
Monocytes Absolute: 0.5 10*3/uL (ref 0.1–1.0)
Platelets: 178 10*3/uL (ref 150–400)
RDW: 13.4 % (ref 11.5–15.5)
WBC: 5.6 10*3/uL (ref 4.0–10.5)

## 2013-08-09 LAB — LIPID PANEL: LDL Cholesterol: 120 mg/dL — ABNORMAL HIGH (ref 0–99)

## 2013-08-09 NOTE — Progress Notes (Signed)
Patient ID: Miguel Thomas, male   DOB: 1941/07/09, 72 y.o.   MRN: [id number]  Subjective:   Patient here for Medicare annual wellness visit and management of other chronic and acute problems.   Acute Concerns: Patient has no acute concerns  Chronic Issues: (1) GERD -- omeprazole 40 mg capsule, followed by Dr. Rande Lawman (2) Hyperlipidemia -- never been on medications (3) Osteopenia -- Calcium/Vitamin D supplementation daily.  Health Maintenance: (1) Colonoscopy due in 2016 -- history of adenoma (2) Sees Urologist for Prostate issues -- [facility] (Dr. Senaida Ores) (3) Dentist -- checkup in July. No abnormal findings. (4) Vision -- 6 months ago. No abnormalities (5) Immunizations -- up-to-date on required immunizations. Received Flu Shot at [facility] last week.    Roster of Physicians Providing Medical Care to Patient: Sandford Craze, FNP -- PCP Dr. Leone Payor -- Gastroenterology Dr. Senaida Ores -- Urology  Activities of Daily Living  In your present state of health, do you have any difficulty performing the following activities?: Preparing food and eating?: No  Bathing yourself: No  Getting dressed: No  Using the toilet:No  Moving around from place to place: No  In the past year have you fallen or had a near fall?:No     Home Safety: Has smoke detector and wears seat belts. No firearms. No excess sun exposure.  Diet and Exercise  Current exercise habits: walks daily -- several laps around neighborhood. Dietary issues discussed: healthy, well-balanced diet. Lots of lean proteins and vegetables  Depression Screen  (Note: if answer to either of the following is "Yes", then a more complete depression screening is indicated)  Q1: Over the past two weeks, have you felt down, depressed or hopeless?no  Q2: Over the past two weeks, have you felt little interest or pleasure in doing things? no   The following portions of the patient's history were reviewed and updated as appropriate:  allergies, current medications, past family history, past medical history, past social history, past surgical history and problem list.    Review of Systems  Review of Systems  Constitutional: Negative for fever, chills, weight loss and malaise/fatigue.  HENT: Negative for ear discharge, ear pain, hearing loss and tinnitus.   Eyes: Negative for blurred vision, double vision and photophobia.  Respiratory: Negative for cough, shortness of breath and wheezing.   Cardiovascular: Negative for chest pain and palpitations.  Gastrointestinal: Positive for heartburn. Negative for nausea, vomiting, abdominal pain, diarrhea, constipation, blood in stool and melena.  Genitourinary: Negative for dysuria, urgency, frequency, hematuria and flank pain.  Musculoskeletal: Negative for myalgias.  Neurological: Negative for dizziness, seizures, loss of consciousness and headaches.  Endo/Heme/Allergies: Negative for environmental allergies.  Psychiatric/Behavioral: Negative for depression and suicidal ideas. The patient is not nervous/anxious and does not have insomnia.     Objective:   Vision: 20/50 R eye corrected; 20/50 L eye corrected, 20/50 Both eyes corrected. Hearing: With hearing aid, patient can hear a forced whisper from across the room. Body mass index: Estimated body mass index is 26.72 kg/(m^2) as calculated from the following:   Height as of this encounter: 5\' 10"  (1.778 m).   Weight as of this encounter: 186 lb 4 oz (84.482 kg). Cognitive Impairment Assessment: cognition, memory and judgment appear normal.   Physical Exam  Vitals reviewed. Constitutional: He is oriented to person, place, and time and well-developed, well-nourished, and in no distress.  HENT:  Head: Normocephalic and atraumatic.  Right Ear: External ear normal.  Left Ear: External ear normal.  Nose:  Nose normal.  Mouth/Throat: Oropharynx is clear and moist. No oropharyngeal exudate.  TM WNL bilaterally  Eyes:  Conjunctivae are normal.  Neck: Neck supple. No thyromegaly present.  Cardiovascular: Normal rate, regular rhythm, normal heart sounds and intact distal pulses.   Pulmonary/Chest: Effort normal and breath sounds normal.  Abdominal: Soft. Bowel sounds are normal. He exhibits no distension and no mass. There is no tenderness. There is no rebound and no guarding.  Musculoskeletal: Normal range of motion.  Lymphadenopathy:    He has no cervical adenopathy.  Neurological: He is alert and oriented to person, place, and time. No cranial nerve deficit. GCS score is 15.  Skin: Skin is warm and dry. No rash noted.  Psychiatric: Affect normal.   Assessment:   Medicare wellness utd on preventive parameters   (1) Hyperlipidemia (2) GERD  (3) Hx of anemia  (4) Hyperglycemia/DIabetes screening    Plan:   (1) Hyperlipidemia -- fasting lipid profile (2) GERD -- well controlled on current regimen.  Patient followed by GI. (3) Hx of anemia -- will obtain CBC (4) Hyperglycemia/DIabetes screening -- Will order BMP  During the course of the visit the patient was educated and counseled about appropriate screening and preventive services including:       Fall prevention   Bone densitometry screening -- performed last year. Diabetes screening  Nutrition counseling   Patient Instructions (the written plan) was given to the patient.

## 2013-08-09 NOTE — Patient Instructions (Signed)
Please obtain labs.  I will call you with your results.  Please return to clinic as needed.  Continue to follow-up with Gastroenterology and Urology.

## 2013-08-14 ENCOUNTER — Encounter: Payer: Self-pay | Admitting: *Deleted

## 2013-11-01 DIAGNOSIS — L821 Other seborrheic keratosis: Secondary | ICD-10-CM | POA: Diagnosis not present

## 2013-11-01 DIAGNOSIS — L819 Disorder of pigmentation, unspecified: Secondary | ICD-10-CM | POA: Diagnosis not present

## 2013-11-16 DIAGNOSIS — Z961 Presence of intraocular lens: Secondary | ICD-10-CM | POA: Diagnosis not present

## 2013-12-01 DIAGNOSIS — N400 Enlarged prostate without lower urinary tract symptoms: Secondary | ICD-10-CM | POA: Diagnosis not present

## 2013-12-01 DIAGNOSIS — N4340 Spermatocele of epididymis, unspecified: Secondary | ICD-10-CM | POA: Diagnosis not present

## 2014-02-07 DIAGNOSIS — L821 Other seborrheic keratosis: Secondary | ICD-10-CM | POA: Diagnosis not present

## 2014-02-07 DIAGNOSIS — L570 Actinic keratosis: Secondary | ICD-10-CM | POA: Diagnosis not present

## 2014-02-07 DIAGNOSIS — L820 Inflamed seborrheic keratosis: Secondary | ICD-10-CM | POA: Diagnosis not present

## 2014-07-14 DIAGNOSIS — Z23 Encounter for immunization: Secondary | ICD-10-CM | POA: Diagnosis not present

## 2014-08-07 ENCOUNTER — Encounter: Payer: Self-pay | Admitting: Internal Medicine

## 2014-09-01 ENCOUNTER — Other Ambulatory Visit: Payer: Self-pay | Admitting: Internal Medicine

## 2014-11-06 DIAGNOSIS — D1801 Hemangioma of skin and subcutaneous tissue: Secondary | ICD-10-CM | POA: Diagnosis not present

## 2014-11-06 DIAGNOSIS — D225 Melanocytic nevi of trunk: Secondary | ICD-10-CM | POA: Diagnosis not present

## 2014-11-06 DIAGNOSIS — L821 Other seborrheic keratosis: Secondary | ICD-10-CM | POA: Diagnosis not present

## 2014-11-06 DIAGNOSIS — L814 Other melanin hyperpigmentation: Secondary | ICD-10-CM | POA: Diagnosis not present

## 2014-11-22 DIAGNOSIS — Z961 Presence of intraocular lens: Secondary | ICD-10-CM | POA: Diagnosis not present

## 2014-12-04 DIAGNOSIS — N402 Nodular prostate without lower urinary tract symptoms: Secondary | ICD-10-CM | POA: Diagnosis not present

## 2014-12-04 DIAGNOSIS — N4340 Spermatocele of epididymis, unspecified: Secondary | ICD-10-CM | POA: Diagnosis not present

## 2014-12-19 ENCOUNTER — Encounter: Payer: Self-pay | Admitting: Internal Medicine

## 2014-12-25 ENCOUNTER — Encounter: Payer: Self-pay | Admitting: Internal Medicine

## 2015-01-21 ENCOUNTER — Encounter: Payer: Medicare Other | Admitting: Family

## 2015-01-21 ENCOUNTER — Telehealth: Payer: Self-pay

## 2015-01-22 ENCOUNTER — Encounter: Payer: Self-pay | Admitting: Family

## 2015-01-22 ENCOUNTER — Ambulatory Visit (INDEPENDENT_AMBULATORY_CARE_PROVIDER_SITE_OTHER): Payer: Medicare Other | Admitting: Family

## 2015-01-22 VITALS — BP 131/79 | HR 56 | Temp 98.0°F | Resp 16 | Ht 70.5 in | Wt 187.4 lb

## 2015-01-22 DIAGNOSIS — R9431 Abnormal electrocardiogram [ECG] [EKG]: Secondary | ICD-10-CM | POA: Diagnosis not present

## 2015-01-22 DIAGNOSIS — Z125 Encounter for screening for malignant neoplasm of prostate: Secondary | ICD-10-CM

## 2015-01-22 DIAGNOSIS — Z0000 Encounter for general adult medical examination without abnormal findings: Secondary | ICD-10-CM

## 2015-01-22 DIAGNOSIS — E785 Hyperlipidemia, unspecified: Secondary | ICD-10-CM | POA: Diagnosis not present

## 2015-01-22 DIAGNOSIS — Z23 Encounter for immunization: Secondary | ICD-10-CM | POA: Diagnosis not present

## 2015-01-22 DIAGNOSIS — M8580 Other specified disorders of bone density and structure, unspecified site: Secondary | ICD-10-CM

## 2015-01-22 DIAGNOSIS — R739 Hyperglycemia, unspecified: Secondary | ICD-10-CM

## 2015-01-22 LAB — LIPID PANEL
Cholesterol: 178 mg/dL (ref 0–200)
HDL: 45.3 mg/dL (ref >39.00)
LDL Cholesterol: 117 mg/dL — ABNORMAL HIGH (ref 0–99)
NonHDL: 132.7
Total CHOL/HDL Ratio: 4
Triglycerides: 77 mg/dL (ref 0.0–149.0)
VLDL: 15.4 mg/dL (ref 0.0–40.0)

## 2015-01-22 LAB — BASIC METABOLIC PANEL
BUN: 15 mg/dL (ref 6–23)
CO2: 30 mEq/L (ref 19–32)
Calcium: 9.2 mg/dL (ref 8.4–10.5)
Chloride: 105 mEq/L (ref 96–112)
Creatinine, Ser: 0.88 mg/dL (ref 0.40–1.50)
GFR: 89.46 mL/min (ref >60.00)
Glucose, Bld: 94 mg/dL (ref 70–99)
Potassium: 3.6 mEq/L (ref 3.5–5.1)
Sodium: 138 mEq/L (ref 135–145)

## 2015-01-22 LAB — HEMOGLOBIN A1C: Hgb A1c MFr Bld: 5.2 % (ref 4.6–6.5)

## 2015-01-22 NOTE — Progress Notes (Signed)
Pre visit review using our clinic review tool, if applicable. No additional management support is needed unless otherwise documented below in the visit note. 

## 2015-01-22 NOTE — Patient Instructions (Signed)
Please complete lab work prior to leaving. You will be contacted about your referral for bone density. Please follow up in 1 year.  Follow up sooner if problems or concerns.

## 2015-01-22 NOTE — Progress Notes (Signed)
   Subjective:    Patient ID: Miguel Thomas, male    DOB: [birth year], 74 y.o.   MRN: [id number]  HPI    Review of Systems  Constitutional: Negative for unexpected weight change.  HENT: Negative for rhinorrhea.   Respiratory: Negative for shortness of breath.        Mild cough  Cardiovascular: Negative for chest pain and leg swelling.  Gastrointestinal: Negative for constipation and blood in stool.  Genitourinary: Negative for frequency and decreased urine volume.  Musculoskeletal: Negative for arthralgias.  Neurological: Negative for headaches.  Hematological: Negative for adenopathy.  Psychiatric/Behavioral:       Denies anxiety and depression       Objective:   Physical Exam        Assessment & Plan:

## 2015-01-22 NOTE — Progress Notes (Signed)
Subjective:    Miguel Thomas is a 74 y.o. male who presents for Medicare Annual/Subsequent preventive examination.   Preventive Screening-Counseling & Management  Tobacco History  Smoking status  . Never Smoker   Smokeless tobacco  . Never Used    Problems Prior to Visit 1. GERD- maintained on prilosec. Stable  Current Problems (verified) Patient Active Problem List   Diagnosis Date Noted  . Osteopenia 07/06/2012  . Skin lesion 06/24/2012  . Abnormal EKG 06/02/2011  . OVERWEIGHT 03/07/2010  . ESOPHAGEAL STRICTURE 03/07/2010  . TESTICULAR DISORDER 08/30/2008  . CHANGES IN SKIN TEXTURE 08/30/2008  . GERD 04/26/2008  . Personbal hx colonic adenomas 04/26/2008    Medications Prior to Visit Current Outpatient Prescriptions on File Prior to Visit  Medication Sig Dispense Refill  . Calcium Carbonate-Vitamin D (CALCIUM 600/VITAMIN D) 600-400 MG-UNIT per tablet Take 1 tablet by mouth daily.    . mupirocin ointment (BACTROBAN) 2 % as needed.     Marland Kitchen omeprazole (PRILOSEC) 40 MG capsule TAKE 1 CAPSULE (40 MG TOTAL) BY MOUTH DAILY BEFORE BREAKFAST. 90 capsule 3   No current facility-administered medications on file prior to visit.    Current Medications (verified) Current Outpatient Prescriptions  Medication Sig Dispense Refill  . Calcium Carbonate-Vitamin D (CALCIUM 600/VITAMIN D) 600-400 MG-UNIT per tablet Take 1 tablet by mouth daily.    . mupirocin ointment (BACTROBAN) 2 % as needed.     Marland Kitchen omeprazole (PRILOSEC) 40 MG capsule TAKE 1 CAPSULE (40 MG TOTAL) BY MOUTH DAILY BEFORE BREAKFAST. 90 capsule 3   No current facility-administered medications for this visit.     Allergies (verified) Review of patient's allergies indicates no known allergies.   PAST HISTORY  Family History Family History  Problem Relation Age of Onset  . Coronary artery disease Other   . Heart disease Other     rheumatic    Social History History  Substance Use Topics  . Smoking status: Never  Smoker   . Smokeless tobacco: Never Used  . Alcohol Use: No    Are there smokers in your home (other than you)?  No  Risk Factors Current exercise habits: none, discussed walking  Dietary issues discussed: fair diet, chicken, fish, veggies   Cardiac risk factors: advanced age (older than 21 for men, 70 for women) and smoking/ tobacco exposure.  Depression Screen (Note: if answer to either of the following is "Yes", a more complete depression screening is indicated)   Q1: Over the past two weeks, have you felt down, depressed or hopeless? No  Q2: Over the past two weeks, have you felt little interest or pleasure in doing things? No  Have you lost interest or pleasure in daily life? No  Do you often feel hopeless? No  Do you cry easily over simple problems? No  Activities of Daily Living In your present state of health, do you have any difficulty performing the following activities?:  Driving? No Managing money?  No Feeding yourself? No Getting from bed to chair? No Climbing a flight of stairs? No Preparing food and eating?: No Bathing or showering? No Getting dressed: No Getting to the toilet? No Using the toilet:No Moving around from place to place: No In the past year have you fallen or had a near fall?:No   Are you sexually active?  No  Do you have more than one partner?  No  Hearing Difficulties: Yes- has hearing aids Do you often ask people to speak up or repeat themselves? Yes  Do you experience ringing or noises in your ears? occasionally Do you have difficulty understanding soft or whispered voices? Yes   Do you feel that you have a problem with memory? No  Do you often misplace items? No  Do you feel safe at home?  Yes  Cognitive Testing  Alert? Yes  Normal Appearance?Yes  Oriented to person? Yes  Place? Yes   Time? Yes  Recall of three objects?  Yes  Can perform simple calculations? Yes  Displays appropriate judgment?Yes  Can read the correct time from a  watch face?Yes   Advanced Directives have been discussed with the patient? Yes   List the Names of Other Physician/Practitioners you currently use: 1.  [facility]  Indicate any recent Medical Services you may have received from other than [facility] providers in the past year (date may be approximate).  Immunization History  Administered Date(s) Administered  . Influenza Whole 08/22/2007, 07/13/2009, 07/19/2010  . Pneumococcal Polysaccharide-23 02/14/2007  . Td 05/19/2004  . Zoster 11/02/2009    Screening Tests Health Maintenance  Topic Date Due  . PNA vac Low Risk Adult (2 of 2 - PCV13) 02/14/2008  . TETANUS/TDAP  05/19/2014  . INFLUENZA VACCINE  06/02/2014  . COLONOSCOPY  12/10/2019  . ZOSTAVAX  Completed    All answers were reviewed with the patient and necessary referrals were made:  O'SULLIVAN,Jinnie Onley S., NP   01/22/2015   History reviewed: allergies, current medications, past family history, past medical history, past social history, past surgical history and problem list  Review of Systems see above    Objective:     Vision by Snellen chart: right eye:20/50, left eye:20/50 There were no vitals taken for this visit. There is no weight on file to calculate BMI.  Physical Exam  Constitutional: He is oriented to person, place, and time. He appears well-developed and well-nourished. No distress.  HENT:  Head: Normocephalic and atraumatic.  Right Ear: Tympanic membrane and ear canal normal.  Left Ear: Tympanic membrane and ear canal normal.  Mouth/Throat: Oropharynx is clear and moist.  Eyes: Pupils are equal, round, and reactive to light. No scleral icterus.  Neck: Normal range of motion. No thyromegaly present.  Cardiovascular: Normal rate and regular rhythm.   No murmur heard. Pulmonary/Chest: Effort normal and breath sounds normal. No respiratory distress. He has no wheezes. He has no rales. He exhibits no tenderness.  Abdominal: Soft. Bowel sounds  are normal. He exhibits no distension and no mass. There is no tenderness. There is no rebound and no guarding.  Musculoskeletal: He exhibits no edema.  Lymphadenopathy:    He has no cervical adenopathy.  Neurological: He is alert and oriented to person, place, and time. He has normal patellar reflexes. He exhibits normal muscle tone. Coordination normal.  Skin: Skin is warm and dry.  Psychiatric: He has a normal mood and affect. His behavior is normal. Judgment and thought content normal.          Assessment & Plan:         Assessment:           Plan:     During the course of the visit the patient was educated and counseled about appropriate screening and preventive services including:    Pneumococcal vaccine   Td vaccine  Screening electrocardiogram  bone density  Diet review for nutrition referral? Yes ____  Not Indicated _x___   Patient Instructions (the written plan) was given to the patient.  Medicare Attestation I  have personally reviewed: The patient's medical and social history Their use of alcohol, tobacco or illicit drugs Their current medications and supplements The patient's functional ability including ADLs,fall risks, home safety risks, cognitive, and hearing and visual impairment Diet and physical activities Evidence for depression or mood disorders  The patient's weight, height, BMI, and visual acuity have been recorded in the chart.  I have made referrals, counseling, and provided education to the patient based on review of the above and I have provided the patient with a written personalized care plan for preventive services.     O'SULLIVAN,Verlene Glantz S., NP   01/22/2015

## 2015-01-22 NOTE — Telephone Encounter (Signed)
Unable to reach pre visit.  

## 2015-02-01 ENCOUNTER — Telehealth: Payer: Self-pay | Admitting: Family

## 2015-02-01 NOTE — Telephone Encounter (Signed)
Spoke with pt and he requests that we mail form.  Forms mailed to pt.

## 2015-02-01 NOTE — Telephone Encounter (Signed)
Pt requested info on advanced directives at his visit.  Could you please give him a copy of the pink MOST form and the HCPOA?

## 2015-02-19 ENCOUNTER — Ambulatory Visit (INDEPENDENT_AMBULATORY_CARE_PROVIDER_SITE_OTHER)
Admission: RE | Admit: 2015-02-19 | Discharge: 2015-02-19 | Disposition: A | Discharge status: Home / Self Care | Payer: Medicare Other | Source: Ambulatory Visit | Attending: Family | Admitting: Family

## 2015-02-19 ENCOUNTER — Ambulatory Visit (AMBULATORY_SURGERY_CENTER): Payer: Self-pay | Admitting: *Deleted

## 2015-02-19 VITALS — Ht 70.5 in | Wt 183.0 lb

## 2015-02-19 DIAGNOSIS — Z86010 Personal history of colonic polyps: Secondary | ICD-10-CM

## 2015-02-19 DIAGNOSIS — M8580 Other specified disorders of bone density and structure, unspecified site: Secondary | ICD-10-CM | POA: Diagnosis not present

## 2015-02-19 NOTE — Progress Notes (Signed)
No egg or soy allergy  No anesthesia or intubation problems per pt  No diet medications taken  Registered in EMMI   

## 2015-02-20 ENCOUNTER — Encounter: Payer: Self-pay | Admitting: Family

## 2015-03-05 ENCOUNTER — Encounter: Payer: Self-pay | Admitting: Internal Medicine

## 2015-03-05 ENCOUNTER — Ambulatory Visit (AMBULATORY_SURGERY_CENTER): Payer: Medicare Other | Admitting: Internal Medicine

## 2015-03-05 VITALS — BP 127/76 | HR 66 | Temp 98.0°F | Resp 13 | Ht 70.0 in | Wt 183.0 lb

## 2015-03-05 DIAGNOSIS — D120 Benign neoplasm of cecum: Secondary | ICD-10-CM | POA: Diagnosis not present

## 2015-03-05 DIAGNOSIS — D123 Benign neoplasm of transverse colon: Secondary | ICD-10-CM | POA: Diagnosis not present

## 2015-03-05 DIAGNOSIS — Z1211 Encounter for screening for malignant neoplasm of colon: Secondary | ICD-10-CM | POA: Diagnosis not present

## 2015-03-05 DIAGNOSIS — Z86010 Personal history of colonic polyps: Secondary | ICD-10-CM | POA: Diagnosis not present

## 2015-03-05 DIAGNOSIS — I10 Essential (primary) hypertension: Secondary | ICD-10-CM | POA: Diagnosis not present

## 2015-03-05 MED ORDER — SODIUM CHLORIDE 0.9 % IV SOLN: 500.0000 mL | INTRAVENOUS | Status: DC

## 2015-03-05 NOTE — Patient Instructions (Addendum)
I found and removed 2 small polyps that look benign. I suspect you will not need further routine colonoscopy. Will send a results and plans letter.  I appreciate the opportunity to care for you. Gatha Mayer, MD, FACG     YOU HAD AN ENDOSCOPIC PROCEDURE TODAY AT [facility]:   Refer to the procedure report that was given to you for any specific questions about what was found during the examination.  If the procedure report does not answer your questions, please call your gastroenterologist to clarify.  If you requested that your care partner not be given the details of your procedure findings, then the procedure report has been included in a sealed envelope for you to review at your convenience later.  YOU SHOULD EXPECT: Some feelings of bloating in the abdomen. Passage of more gas than usual.  Walking can help get rid of the air that was put into your GI tract during the procedure and reduce the bloating. If you had a lower endoscopy (such as a colonoscopy or flexible sigmoidoscopy) you may notice spotting of blood in your stool or on the toilet paper. If you underwent a bowel prep for your procedure, you may not have a normal bowel movement for a few days.  Please Note:  You might notice some irritation and congestion in your nose or some drainage.  This is from the oxygen used during your procedure.  There is no need for concern and it should clear up in a day or so.  SYMPTOMS TO REPORT IMMEDIATELY:   Following lower endoscopy (colonoscopy or flexible sigmoidoscopy):  Excessive amounts of blood in the stool  Significant tenderness or worsening of abdominal pains  Swelling of the abdomen that is new, acute  Fever of 100F or higher   For urgent or emergent issues, a gastroenterologist can be reached at any hour by calling [phone].   DIET: Your first meal following the procedure should be a small meal and then it is ok to progress to your normal diet.  Heavy or fried foods are harder to digest and may make you feel nauseous or bloated.  Likewise, meals heavy in dairy and vegetables can increase bloating.  Drink plenty of fluids but you should avoid alcoholic beverages for 24 hours.  ACTIVITY:  You should plan to take it easy for the rest of today and you should NOT DRIVE or use heavy machinery until tomorrow (because of the sedation medicines used during the test).    FOLLOW UP: Our staff will call the number listed on your records the next business day following your procedure to check on you and address any questions or concerns that you may have regarding the information given to you following your procedure. If we do not reach you, we will leave a message.  However, if you are feeling well and you are not experiencing any problems, there is no need to return our call.  We will assume that you have returned to your regular daily activities without incident.  If any biopsies were taken you will be contacted by phone or by letter within the next 1-3 weeks.  Please call us at [phone] if you have not heard about the biopsies in 3 weeks.    SIGNATURES/CONFIDENTIALITY: You and/or your care partner have signed paperwork which will be entered into your electronic medical record.  These signatures attest to the fact that that the information above on your After Visit Summary has been  reviewed and is understood.  Full responsibility of the confidentiality of this discharge information lies with you and/or your care-partner.    Handout was given to your care partner on polyps.  You may resume your current medications today. Await biopsy results. Please call if any questions or concerns.

## 2015-03-05 NOTE — Progress Notes (Signed)
Called to room to assist during endoscopic procedure.  Patient ID and intended procedure confirmed with present staff. Received instructions for my participation in the procedure from the performing physician.  

## 2015-03-05 NOTE — Progress Notes (Signed)
A/ox3 pleased with MAC, report to Annette RN 

## 2015-03-05 NOTE — Op Note (Signed)
[place]  [address] [place], [id number]   COLONOSCOPY PROCEDURE REPORT  PATIENT: Miguel Thomas, Miguel Thomas  MR#: [id number] BIRTHDATE: 1941/09/15 , 74  yrs. old GENDER: male ENDOSCOPIST: Gatha Mayer, MD, [facility] PROCEDURE DATE:  03/05/2015 PROCEDURE:   Colonoscopy with biopsy, Colonoscopy with snare polypectomy, and Colonoscopy, surveillance First Screening Colonoscopy - Avg.  risk and is 50 yrs.  old or older - No.  Prior Negative Screening - Now for repeat screening. N/A  History of Adenoma - Now for follow-up colonoscopy & has been > or = to 3 yrs.  Yes hx of adenoma.  Has been 3 or more years since last colonoscopy. ASA CLASS:   Class II INDICATIONS:Surveillance due to prior colonic neoplasia and PH Colon Adenoma. MEDICATIONS: Propofol 180 mg IV and Monitored anesthesia care  DESCRIPTION OF PROCEDURE:   After the risks benefits and alternatives of the procedure were thoroughly explained, informed consent was obtained.  The digital rectal exam revealed no abnormalities of the rectum, revealed no prostatic nodules, and revealed the prostate was not enlarged.   The LB [id number] endoscope was introduced through the anus and advanced to the cecum, which was identified by both the appendix and ileocecal valve. No adverse events experienced.   The quality of the prep was good.  (MiraLax was used)  The instrument was then slowly withdrawn as the colon was fully examined.      COLON FINDINGS: Two sessile polyps ranging from 2 to 65mm in size were found at the cecum and in the transverse colon.  Polypectomies were performed with cold forceps (87mm transverse) and with a cold snare (5 mm cecal).  The resection was complete, the polyp tissue was completely retrieved and sent to histology.   The examination was otherwise normal.  Retroflexed views revealed no abnormalities. The time to cecum = 4.6 Withdrawal time = 12.9   The scope was withdrawn and the procedure  completed. COMPLICATIONS: There were no immediate complications.  ENDOSCOPIC IMPRESSION: 1.   Two sessile polyps ranging from 2 to 81mm in size were found at the cecum and in the transverse colon; polypectomies were performed with cold forceps and with a cold snare 2.   The examination was otherwise normal - good prep - hx diminutive adenomas 2006, 2011  RECOMMENDATIONS: Probably does not need routine repeat colonoscopy as he is now 61 - await pathology for final recommendation  eSigned:  Gatha Mayer, MD, [facility] 03/05/2015 9:36 AM   cc: The Patient

## 2015-03-05 NOTE — Progress Notes (Signed)
No problems noted in the recovery room. maw 

## 2015-03-06 ENCOUNTER — Telehealth: Payer: Self-pay | Admitting: *Deleted

## 2015-03-06 NOTE — Telephone Encounter (Signed)
  Follow up Call-  Call back number 03/05/2015  Post procedure Call Back phone  # [phone]  Permission to leave phone message Yes     Patient questions:  Do you have a fever, pain , or abdominal swelling? No. Pain Score  0 *  Have you tolerated food without any problems? Yes.    Have you been able to return to your normal activities? Yes.    Do you have any questions about your discharge instructions: Diet   No. Medications  No. Follow up visit  No.  Do you have questions or concerns about your Care? No.  Actions: * If pain score is 4 or above: No action needed, pain <4.

## 2015-03-12 ENCOUNTER — Encounter: Payer: Self-pay | Admitting: Internal Medicine

## 2015-03-12 DIAGNOSIS — Z86010 Personal history of colonic polyps: Secondary | ICD-10-CM

## 2015-03-12 NOTE — Progress Notes (Signed)
Quick Note:  2 diminutive adenomas No recall colonoscopy (age) ______

## 2015-05-03 ENCOUNTER — Other Ambulatory Visit: Payer: Self-pay | Admitting: Physician Assistant

## 2015-05-03 MED ORDER — TRIAMCINOLONE ACETONIDE 0.025 % EX OINT: 1.0000 "application " | TOPICAL_OINTMENT | Freq: Two times a day (BID) | CUTANEOUS | Status: DC

## 2015-05-04 ENCOUNTER — Other Ambulatory Visit: Payer: Self-pay | Admitting: Internal Medicine

## 2015-06-05 ENCOUNTER — Other Ambulatory Visit: Payer: Self-pay | Admitting: Internal Medicine

## 2015-07-23 DIAGNOSIS — Z23 Encounter for immunization: Secondary | ICD-10-CM | POA: Diagnosis not present

## 2015-11-07 DIAGNOSIS — L821 Other seborrheic keratosis: Secondary | ICD-10-CM | POA: Diagnosis not present

## 2015-11-07 DIAGNOSIS — L812 Freckles: Secondary | ICD-10-CM | POA: Diagnosis not present

## 2015-12-04 LAB — HM COLONOSCOPY: HM Colonoscopy: 2

## 2015-12-19 DIAGNOSIS — Z961 Presence of intraocular lens: Secondary | ICD-10-CM | POA: Diagnosis not present

## 2015-12-23 DIAGNOSIS — N401 Enlarged prostate with lower urinary tract symptoms: Secondary | ICD-10-CM | POA: Diagnosis not present

## 2015-12-23 DIAGNOSIS — Z0000 Encounter for general adult medical examination without abnormal findings: Secondary | ICD-10-CM | POA: Diagnosis not present

## 2015-12-23 DIAGNOSIS — N138 Other obstructive and reflux uropathy: Secondary | ICD-10-CM | POA: Diagnosis not present

## 2015-12-23 DIAGNOSIS — N4340 Spermatocele of epididymis, unspecified: Secondary | ICD-10-CM | POA: Diagnosis not present

## 2015-12-24 LAB — PSA

## 2016-01-15 ENCOUNTER — Other Ambulatory Visit: Payer: Self-pay | Admitting: Internal Medicine

## 2016-01-23 ENCOUNTER — Telehealth: Payer: Self-pay | Admitting: Behavioral Health

## 2016-01-23 NOTE — Telephone Encounter (Signed)
Patient voiced that he will update his information in the morning at the appointment.

## 2016-01-24 ENCOUNTER — Ambulatory Visit (INDEPENDENT_AMBULATORY_CARE_PROVIDER_SITE_OTHER): Payer: Medicare Other | Admitting: Family

## 2016-01-24 ENCOUNTER — Encounter: Payer: Self-pay | Admitting: Family

## 2016-01-24 ENCOUNTER — Telehealth: Payer: Self-pay | Admitting: Family

## 2016-01-24 VITALS — Ht 70.5 in | Wt 190.4 lb

## 2016-01-24 DIAGNOSIS — Z0000 Encounter for general adult medical examination without abnormal findings: Secondary | ICD-10-CM | POA: Diagnosis not present

## 2016-01-24 NOTE — Patient Instructions (Signed)

## 2016-01-24 NOTE — Progress Notes (Signed)
Pre visit review using our clinic review tool, if applicable. No additional management support is needed unless otherwise documented below in the visit note. 

## 2016-01-24 NOTE — Telephone Encounter (Signed)
Please contact pt to arrange a follow up appointment and let him know that I would like him to return for additional memory testing.

## 2016-01-24 NOTE — Telephone Encounter (Signed)
Home # not accepting calls, cell # VM not setup, work # no answer

## 2016-01-24 NOTE — Progress Notes (Signed)
Subjective:    Miguel Thomas is a 75 y.o. male who presents for Medicare Annual/Subsequent preventive examination.   Preventive Screening-Counseling & Management  Tobacco History  Smoking status  . Never Smoker   Smokeless tobacco  . Never Used    Problems Prior to Visit 1. GERD- reports symptoms are well controlled on PPI.   Patient presents today for complete physical.  Immunizations: up to date Diet: needs improvement Exercise: mows push mower Colonoscopy: 2/1/7  Current Problems (verified) Patient Active Problem List   Diagnosis Date Noted  . Osteopenia 07/06/2012  . Skin lesion 06/24/2012  . Abnormal EKG 06/02/2011  . OVERWEIGHT 03/07/2010  . ESOPHAGEAL STRICTURE 03/07/2010  . TESTICULAR DISORDER 08/30/2008  . CHANGES IN SKIN TEXTURE 08/30/2008  . GERD 04/26/2008  . History of colonic polyps 04/26/2008    Medications Prior to Visit Current Outpatient Prescriptions on File Prior to Visit  Medication Sig Dispense Refill  . Calcium Carbonate-Vitamin D (CALCIUM 600/VITAMIN D) 600-400 MG-UNIT per tablet Take 1 tablet by mouth daily.    Marland Kitchen omeprazole (PRILOSEC) 40 MG capsule TAKE ONE CAPSULE BY MOUTH ONCE DAILY BEFORE  BREAKFAST 30 capsule 0   No current facility-administered medications on file prior to visit.    Current Medications (verified) Current Outpatient Prescriptions  Medication Sig Dispense Refill  . Calcium Carbonate-Vitamin D (CALCIUM 600/VITAMIN D) 600-400 MG-UNIT per tablet Take 1 tablet by mouth daily.    Marland Kitchen omeprazole (PRILOSEC) 40 MG capsule TAKE ONE CAPSULE BY MOUTH ONCE DAILY BEFORE  BREAKFAST 30 capsule 0   No current facility-administered medications for this visit.     Allergies (verified) Review of patient's allergies indicates no known allergies.   PAST HISTORY  Family History Family History  Problem Relation Age of Onset  . Coronary artery disease Other   . Heart disease Other     rheumatic  . Colon cancer Neg Hx   . Esophageal  cancer Neg Hx   . Rectal cancer Neg Hx   . Stomach cancer Neg Hx     Social History Social History  Substance Use Topics  . Smoking status: Never Smoker   . Smokeless tobacco: Never Used  . Alcohol Use: No    Are there smokers in your home (other than you)?  No  Risk Factors Current exercise habits: mows the lawn  Dietary issues discussed: discussed healthy diet   Cardiac risk factors: advanced age (older than 80 for men, 29 for women), dyslipidemia and male gender.  Depression Screen (Note: if answer to either of the following is "Yes", a more complete depression screening is indicated)   Q1: Over the past two weeks, have you felt down, depressed or hopeless? No  Q2: Over the past two weeks, have you felt little interest or pleasure in doing things? No  Have you lost interest or pleasure in daily life? No  Do you often feel hopeless? No  Do you cry easily over simple problems? No  Activities of Daily Living In your present state of health, do you have any difficulty performing the following activities?:  Driving? No Managing money?  No Feeding yourself? No Getting from bed to chair? No Climbing a flight of stairs? No Preparing food and eating?: No Bathing or showering? No Getting dressed: No Getting to the toilet? No Using the toilet:No Moving around from place to place: No In the past year have you fallen or had a near fall?:No   Are you sexually active?  Yes  Do you  have more than one partner?  No  Hearing Difficulties: Yes wears hearing aids Do you often ask people to speak up or repeat themselves? occasionally Do you experience ringing or noises in your ears? Yes Do you have difficulty understanding soft or whispered voices? No   Do you feel that you have a problem with memory? Yes reports that sometimes he forgets names  Do you often misplace items? Yes  Do you feel safe at home?  Yes  Cognitive Testing  Alert? Yes  Normal Appearance?Yes  Oriented to  person? Yes  Place? Yes   Time? Yes  Recall of three objects?  No  Can perform simple calculations? Yes  Displays appropriate judgment?Yes  Can read the correct time from a watch face?Yes   Advanced Directives have been discussed with the patient? Yes   List the Names of Other Physician/Practitioners you currently use: 1.    Indicate any recent Medical Services you may have received from other than [facility] providers in the past year (date may be approximate).  Immunization History  Administered Date(s) Administered  . Influenza Whole 08/22/2007, 07/13/2009, 07/19/2010  . Influenza, High Dose Seasonal PF 07/23/2015  . Influenza-Unspecified 08/02/2014  . Pneumococcal Conjugate-13 01/22/2015  . Pneumococcal Polysaccharide-23 02/14/2007  . Td 05/19/2004, 01/22/2015  . Zoster 11/02/2009    Screening Tests Health Maintenance  Topic Date Due  . INFLUENZA VACCINE  06/02/2016  . TETANUS/TDAP  01/21/2025  . ZOSTAVAX  Completed  . PNA vac Low Risk Adult  Completed    All answers were reviewed with the patient and necessary referrals were made:  O'SULLIVAN,Fatin Bachicha S., NP   01/24/2016   History reviewed: allergies, current medications, past family history, past medical history, past social history, past surgical history and problem list  Review of Systems Pertinent items are noted in HPI.    Objective:  Height 5' 10.5" (1.791 m), weight 190 lb 6.4 oz (86.365 kg). Body mass index is 26.92 kg/(m^2).  Physical Exam  Constitutional: He is oriented to person, place, and time. He appears well-developed and well-nourished. No distress.  HENT:  Head: Normocephalic and atraumatic.  Right Ear: Tympanic membrane and ear canal normal.  Left Ear: Tympanic membrane and ear canal normal.  Mouth/Throat: Oropharynx is clear and moist.  Eyes: Pupils are equal, round, and reactive to light. No scleral icterus.  Neck: Normal range of motion. No thyromegaly present.  Cardiovascular: Normal rate  and regular rhythm.   No murmur heard. Pulmonary/Chest: Effort normal and breath sounds normal. No respiratory distress. He has no wheezes. He has no rales. He exhibits no tenderness.  Abdominal: Soft. Bowel sounds are normal. He exhibits no distension and no mass. There is no tenderness. There is no rebound and no guarding.  Musculoskeletal: He exhibits no edema.  Lymphadenopathy:    He has no cervical adenopathy.  Neurological: He is alert and oriented to person, place, and time. He has normal patellar reflexes. He exhibits normal muscle tone. Coordination normal.  Skin: Skin is warm and dry.  Psychiatric: He has a normal mood and affect. His behavior is normal. Judgment and thought content normal.          Assessment & Plan:        Assessment:       Plan:     During the course of the visit the patient was educated and counseled about appropriate screening and preventive services including:    Nutrition counseling   Advanced directives: pt will be given copy of  HCPOA  Diet review for nutrition referral? Yes ____  Not Indicated _x___   Patient Instructions (the written plan) was given to the patient.  Medicare Attestation I have personally reviewed: The patient's medical and social history Their use of alcohol, tobacco or illicit drugs Their current medications and supplements The patient's functional ability including ADLs,fall risks, home safety risks, cognitive, and hearing and visual impairment Diet and physical activities Evidence for depression or mood disorders  The patient's weight, height, BMI, and visual acuity have been recorded in the chart.  I have made referrals, counseling, and provided education to the patient based on review of the above and I have provided the patient with a written personalized care plan for preventive services.     O'SULLIVAN,Patric Vanpelt S., NP   01/24/2016

## 2016-01-27 NOTE — Telephone Encounter (Signed)
Gilmore Laroche, would you mind mailing him a letter please? thanks

## 2016-01-27 NOTE — Telephone Encounter (Signed)
Home # not accepting calls, cell # VM not setup, work # no answer

## 2016-01-27 NOTE — Telephone Encounter (Signed)
Spoke with pt's spouse and scheduled f/u for pt on 02/03/16 at 2:30pm.

## 2016-01-29 ENCOUNTER — Telehealth: Payer: Self-pay | Admitting: *Deleted

## 2016-01-29 NOTE — Telephone Encounter (Signed)
-----   Message from [name], NP sent at 01/24/2016  3:04 PM EDT ----- Would you mind sending 2 copies of HCPOA to them please?    Thanks,  [employer]

## 2016-01-29 NOTE — Telephone Encounter (Signed)
Copies mailed to pt and spouse.

## 2016-02-03 ENCOUNTER — Encounter: Payer: Self-pay | Admitting: Family

## 2016-02-03 ENCOUNTER — Ambulatory Visit (INDEPENDENT_AMBULATORY_CARE_PROVIDER_SITE_OTHER): Payer: Medicare Other | Admitting: Family

## 2016-02-03 VITALS — BP 131/73 | HR 69 | Temp 98.3°F | Resp 18 | Ht 70.5 in | Wt 192.0 lb

## 2016-02-03 DIAGNOSIS — R413 Other amnesia: Secondary | ICD-10-CM | POA: Diagnosis not present

## 2016-02-03 NOTE — Patient Instructions (Addendum)
Follow up in 1 year for your annual wellness visit.

## 2016-02-03 NOTE — Progress Notes (Signed)
Pre visit review using our clinic review tool, if applicable. No additional management support is needed unless otherwise documented below in the visit note. 

## 2016-02-03 NOTE — Progress Notes (Signed)
Subjective:    Patient ID: Miguel Thomas, male    DOB: 20-Jan-1941, 75 y.o.   MRN: [id number]  HPI  [name] is a 75 yr old male who presents today for memory testing.  Last visit he was unable to do the 3 object recall.  Wife notes that he often loses his keys and sometimes can be forgetful.    Review of Systems    See HPI  Past Medical History  Diagnosis Date  . Diverticulosis 12-12-04  . Adenomatous polyp of colon 12-12-04    adenomatous polyp neg for high grade dysplasia or malignancy  . GERD (gastroesophageal reflux disease) 12-12-04    ring like esophageal stricture with reflux esophagitis, also in 2011  . Hearing loss   . Esophageal stricture   . Intestinal metaplasia of gastric mucosa     Social History   Social History  . Marital Status: Married    Spouse Name: N/A  . Number of Children: N/A  . Years of Education: N/A   Occupational History  . Not on file.   Social History Main Topics  . Smoking status: Never Smoker   . Smokeless tobacco: Never Used  . Alcohol Use: No  . Drug Use: No  . Sexual Activity: Not on file   Other Topics Concern  . Not on file   Social History Narrative   Retired   Married (2nd marriage)   Daily Caffeine- use up to 6/ daily          Past Surgical History  Procedure Laterality Date  . Hernia repair    . Tonsillectomy    . Colonoscopy w/ biopsies    . Esophagogastroduodenoscopy    . Prostate biopsy    . Intraocular lens insertion    . Colonoscopy      Family History  Problem Relation Age of Onset  . Coronary artery disease Other   . Heart disease Other     rheumatic  . Colon cancer Neg Hx   . Esophageal cancer Neg Hx   . Rectal cancer Neg Hx   . Stomach cancer Neg Hx     No Known Allergies  Current Outpatient Prescriptions on File Prior to Visit  Medication Sig Dispense Refill  . Calcium Carbonate-Vitamin D (CALCIUM 600/VITAMIN D) 600-400 MG-UNIT per tablet Take 1 tablet by mouth daily.    Marland Kitchen omeprazole  (PRILOSEC) 40 MG capsule TAKE ONE CAPSULE BY MOUTH ONCE DAILY BEFORE  BREAKFAST 30 capsule 0   No current facility-administered medications on file prior to visit.    BP 131/73 mmHg  Pulse 69  Temp(Src) 98.3 F (36.8 C) (Oral)  Resp 18  Ht 5' 10.5" (1.791 m)  Wt 192 lb (87.091 kg)  BMI 27.15 kg/m2  SpO2 100%       Objective:   Physical Exam  Constitutional: He is oriented to person, place, and time. He appears well-developed and well-nourished.  Neurological: He is alert and oriented to person, place, and time.  Psychiatric: He has a normal mood and affect. His behavior is normal. Judgment and thought content normal.          Assessment & Plan:  Memory Problem- pt completed  MMSE today.  Scored 29/30 in the normal range. Advised pt and wife that memory appears normal. They are to contact me if he seems to have any changes in memory. 15 minutes spent with pt today. >50% of this time was spent counseling patient and wife on memory.

## 2016-02-25 ENCOUNTER — Telehealth: Payer: Self-pay | Admitting: Internal Medicine

## 2016-02-25 MED ORDER — OMEPRAZOLE 40 MG PO CPDR: DELAYED_RELEASE_CAPSULE | ORAL | Status: DC

## 2016-02-25 NOTE — Telephone Encounter (Signed)
Refill sent as requested. 

## 2016-04-07 ENCOUNTER — Other Ambulatory Visit: Payer: Self-pay | Admitting: Internal Medicine

## 2016-04-07 MED ORDER — OMEPRAZOLE 40 MG PO CPDR: DELAYED_RELEASE_CAPSULE | ORAL | Status: DC

## 2016-04-07 NOTE — Telephone Encounter (Signed)
Prescription sent to patient's pharmacy.

## 2016-05-11 ENCOUNTER — Other Ambulatory Visit: Payer: Self-pay | Admitting: Internal Medicine

## 2016-05-14 ENCOUNTER — Ambulatory Visit (INDEPENDENT_AMBULATORY_CARE_PROVIDER_SITE_OTHER): Payer: Medicare Other | Admitting: Family Medicine

## 2016-05-14 ENCOUNTER — Encounter: Payer: Self-pay | Admitting: Family Medicine

## 2016-05-14 VITALS — BP 127/74 | HR 60 | Ht 71.0 in | Wt 190.0 lb

## 2016-05-14 DIAGNOSIS — M722 Plantar fascial fibromatosis: Secondary | ICD-10-CM

## 2016-05-14 NOTE — Patient Instructions (Signed)
You have plantar fasciitis Take tylenol as needed for pain Plantar fascia stretch for 20-30 seconds (do 3 of these) in morning Lowering/raise on a step exercises 3 x 10 once or twice a day - this is very important for long term recovery. Can add heel walks, toe walks forward and backward as well Ice heel for 15 minutes as needed. Avoid flat shoes/barefoot walking as much as possible. Arch straps have been shown to help with pain. Arch supports like dr. Zoe Lan active series or our green sports insoles. Consider orthotics, injection, physical therapy if not improving as expected. Follow up with me in 6 weeks.

## 2016-05-18 DIAGNOSIS — M722 Plantar fascial fibromatosis: Secondary | ICD-10-CM

## 2016-05-18 HISTORY — DX: Plantar fascial fibromatosis: M72.2

## 2016-05-18 NOTE — Progress Notes (Signed)
PCP and consultation requested by: Nance Pear., NP  Subjective:   HPI: Patient is a 75 y.o. male here for left heel pain.  Patient reports he's had 7-10 days of plantar left heel pain. Describes this as a soreness. Worse with walking Pain level 3/10. No injury or trauma. No prior issues with this. No skin changes, numbness.  Past Medical History  Diagnosis Date  . Diverticulosis 12-12-04  . Adenomatous polyp of colon 12-12-04    adenomatous polyp neg for high grade dysplasia or malignancy  . GERD (gastroesophageal reflux disease) 12-12-04    ring like esophageal stricture with reflux esophagitis, also in 2011  . Hearing loss   . Esophageal stricture   . Intestinal metaplasia of gastric mucosa     Current Outpatient Prescriptions on File Prior to Visit  Medication Sig Dispense Refill  . Calcium Carbonate-Vitamin D (CALCIUM 600/VITAMIN D) 600-400 MG-UNIT per tablet Take 1 tablet by mouth daily.    Marland Kitchen omeprazole (PRILOSEC) 40 MG capsule TAKE ONE CAPSULE BY MOUTH ONCE DAILY BEFORE  BREAKFAST 30 capsule 0   No current facility-administered medications on file prior to visit.    Past Surgical History  Procedure Laterality Date  . Hernia repair    . Tonsillectomy    . Colonoscopy w/ biopsies    . Esophagogastroduodenoscopy    . Prostate biopsy    . Intraocular lens insertion    . Colonoscopy      No Known Allergies  Social History   Social History  . Marital Status: Married    Spouse Name: N/A  . Number of Children: N/A  . Years of Education: N/A   Occupational History  . Not on file.   Social History Main Topics  . Smoking status: Never Smoker   . Smokeless tobacco: Never Used  . Alcohol Use: No  . Drug Use: No  . Sexual Activity: Not on file   Other Topics Concern  . Not on file   Social History Narrative   Retired   Married (2nd marriage)   Daily Caffeine- use up to 6/ daily          Family History  Problem Relation Age of Onset  .  Coronary artery disease Other   . Heart disease Other     rheumatic  . Colon cancer Neg Hx   . Esophageal cancer Neg Hx   . Rectal cancer Neg Hx   . Stomach cancer Neg Hx     BP 127/74 mmHg  Pulse 60  Ht 5\' 11"  (1.803 m)  Wt 190 lb (86.183 kg)  BMI 26.51 kg/m2  Review of Systems: See HPI above.    Objective:  Physical Exam:  Gen: NAD, comfortable in exam room  Left foot/ankle: No gross deformity, swelling, ecchymoses FROM TTP plantar fascia at insertion of calcaneus medially.  No other tenderness. Negative ant drawer and talar tilt.   Negative syndesmotic compression. Negative calcaneus squeeze. Thompsons test negative. NV intact distally.  Right foot/ankle: FROM without pain.    Assessment & Plan:  1. Left plantar fasciitis - arch binders.  Discussed arch supports as well.  Shown home exercises and stretches to do daily.  Icing.  Tylenol as needed.  F/u in 6 weeks.  Consider orthotics, physical therapy, injection if not improving.

## 2016-05-18 NOTE — Assessment & Plan Note (Signed)
arch binders.  Discussed arch supports as well.  Shown home exercises and stretches to do daily.  Icing.  Tylenol as needed.  F/u in 6 weeks.  Consider orthotics, physical therapy, injection if not improving.

## 2016-06-15 ENCOUNTER — Other Ambulatory Visit: Payer: Self-pay | Admitting: Internal Medicine

## 2016-06-24 ENCOUNTER — Encounter (INDEPENDENT_AMBULATORY_CARE_PROVIDER_SITE_OTHER): Payer: Self-pay

## 2016-06-24 ENCOUNTER — Ambulatory Visit (INDEPENDENT_AMBULATORY_CARE_PROVIDER_SITE_OTHER): Payer: Medicare Other | Admitting: Family Medicine

## 2016-06-24 ENCOUNTER — Encounter: Payer: Self-pay | Admitting: Family Medicine

## 2016-06-24 DIAGNOSIS — M722 Plantar fascial fibromatosis: Secondary | ICD-10-CM | POA: Diagnosis not present

## 2016-06-24 NOTE — Progress Notes (Signed)
PCP and consultation requested by: Nance Pear., NP  Subjective:   HPI: Patient is a 75 y.o. male here for left heel pain.  7/13: Patient reports he's had 7-10 days of plantar left heel pain. Describes this as a soreness. Worse with walking Pain level 3/10. No injury or trauma. No prior issues with this. No skin changes, numbness.  8/23: Patient reports he's doing extremely well. Pain level 0/10. Doing home exercises and stretches. Wearing dr. Zoe Lan active series. Arch binders also. No skin changes, numbness.  Past Medical History:  Diagnosis Date  . Adenomatous polyp of colon 12-12-04   adenomatous polyp neg for high grade dysplasia or malignancy  . Diverticulosis 12-12-04  . Esophageal stricture   . GERD (gastroesophageal reflux disease) 12-12-04   ring like esophageal stricture with reflux esophagitis, also in 2011  . Hearing loss   . Intestinal metaplasia of gastric mucosa     Current Outpatient Prescriptions on File Prior to Visit  Medication Sig Dispense Refill  . Calcium Carbonate-Vitamin D (CALCIUM 600/VITAMIN D) 600-400 MG-UNIT per tablet Take 1 tablet by mouth daily.    Marland Kitchen omeprazole (PRILOSEC) 40 MG capsule TAKE ONE CAPSULE BY MOUTH ONCE DAILY BEFORE BREAKFAST 30 capsule 0   No current facility-administered medications on file prior to visit.     Past Surgical History:  Procedure Laterality Date  . COLONOSCOPY    . COLONOSCOPY W/ BIOPSIES    . ESOPHAGOGASTRODUODENOSCOPY    . HERNIA REPAIR    . INTRAOCULAR LENS INSERTION    . PROSTATE BIOPSY    . TONSILLECTOMY      No Known Allergies  Social History   Social History  . Marital status: Married    Spouse name: N/A  . Number of children: N/A  . Years of education: N/A   Occupational History  . Not on file.   Social History Main Topics  . Smoking status: Never Smoker  . Smokeless tobacco: Never Used  . Alcohol use No  . Drug use: No  . Sexual activity: Not on file   Other Topics  Concern  . Not on file   Social History Narrative   Retired   Married (2nd marriage)   Daily Caffeine- use up to 6/ daily          Family History  Problem Relation Age of Onset  . Coronary artery disease Other   . Heart disease Other     rheumatic  . Colon cancer Neg Hx   . Esophageal cancer Neg Hx   . Rectal cancer Neg Hx   . Stomach cancer Neg Hx     BP 126/69   Pulse (!) 57   Ht 5\' 11"  (1.803 m)   Wt 190 lb (86.2 kg)   BMI 26.50 kg/m   Review of Systems: See HPI above.    Objective:  Physical Exam:  Gen: NAD, comfortable in exam room  Left foot/ankle: No gross deformity, swelling, ecchymoses FROM No TTP plantar fascia currently.  No other tenderness. Negative ant drawer and talar tilt.   Negative syndesmotic compression. Negative calcaneus squeeze. Thompsons test negative. NV intact distally.  Right foot/ankle: FROM without pain.    Assessment & Plan:  1. Left plantar fasciitis - Much improved.  Advised to continue arch binders, dr. Zoe Lan active series, home exercises for 4-6 more weeks.  Tylenol, icing if needed.  F/u prn.

## 2016-06-24 NOTE — Patient Instructions (Signed)
I would continue with the arch binders, dr. Zoe Lan active series, and the exercises for 4-6 more weeks. After this if you're feeling well you can stop these things. Follow up with me as needed otherwise.

## 2016-06-24 NOTE — Assessment & Plan Note (Signed)
Much improved.  Advised to continue arch binders, dr. Zoe Lan active series, home exercises for 4-6 more weeks.  Tylenol, icing if needed.  F/u prn.

## 2016-07-22 ENCOUNTER — Other Ambulatory Visit: Payer: Self-pay | Admitting: Internal Medicine

## 2016-07-25 ENCOUNTER — Other Ambulatory Visit: Payer: Self-pay | Admitting: Internal Medicine

## 2016-07-27 ENCOUNTER — Other Ambulatory Visit: Payer: Self-pay

## 2016-07-27 MED ORDER — OMEPRAZOLE 40 MG PO CPDR: DELAYED_RELEASE_CAPSULE | ORAL | 1 refills | Status: DC

## 2016-08-11 DIAGNOSIS — Z23 Encounter for immunization: Secondary | ICD-10-CM | POA: Diagnosis not present

## 2016-08-14 ENCOUNTER — Ambulatory Visit (INDEPENDENT_AMBULATORY_CARE_PROVIDER_SITE_OTHER): Payer: Medicare Other | Admitting: Family

## 2016-08-14 ENCOUNTER — Encounter: Payer: Self-pay | Admitting: Family

## 2016-08-14 VITALS — BP 108/58 | HR 72 | Temp 98.1°F | Ht 71.0 in | Wt 191.0 lb

## 2016-08-14 DIAGNOSIS — S90211A Contusion of right great toe with damage to nail, initial encounter: Secondary | ICD-10-CM | POA: Diagnosis not present

## 2016-08-14 MED ORDER — CEPHALEXIN 500 MG PO CAPS: 500.0000 mg | ORAL_CAPSULE | Freq: Two times a day (BID) | ORAL | 0 refills | Status: DC

## 2016-08-14 NOTE — Patient Instructions (Signed)
Please begin keflex (antibiotic) for your toe. Call if you develop increased pain, redness or swelling of your toenail.

## 2016-08-14 NOTE — Progress Notes (Signed)
Subjective:    Patient ID: Miguel Thomas, male    DOB: 1941-03-20, 75 y.o.   MRN: [id number]  HPI  Miguel Thomas is a 75 yr old male who presents today following a toe injury. Reports that he dropped his wife's oxygen tank on his right great toe 3 weeks ago.  Reports that he sterilized a pin and poked into the nail bed after it happened and was able to express some bloody discharge.   Review of Systems See  HPI  Past Medical History:  Diagnosis Date  . Adenomatous polyp of colon 12-12-04   adenomatous polyp neg for high grade dysplasia or malignancy  . Diverticulosis 12-12-04  . Esophageal stricture   . GERD (gastroesophageal reflux disease) 12-12-04   ring like esophageal stricture with reflux esophagitis, also in 2011  . Hearing loss   . Intestinal metaplasia of gastric mucosa      Social History   Social History  . Marital status: Married    Spouse name: N/A  . Number of children: N/A  . Years of education: N/A   Occupational History  . Not on file.   Social History Main Topics  . Smoking status: Never Smoker  . Smokeless tobacco: Never Used  . Alcohol use No  . Drug use: No  . Sexual activity: Not on file   Other Topics Concern  . Not on file   Social History Narrative   Retired   Married (2nd marriage)   Daily Caffeine- use up to 6/ daily          Past Surgical History:  Procedure Laterality Date  . COLONOSCOPY    . COLONOSCOPY W/ BIOPSIES    . ESOPHAGOGASTRODUODENOSCOPY    . HERNIA REPAIR    . INTRAOCULAR LENS INSERTION    . PROSTATE BIOPSY    . TONSILLECTOMY      Family History  Problem Relation Age of Onset  . Coronary artery disease Other   . Heart disease Other     rheumatic  . Colon cancer Neg Hx   . Esophageal cancer Neg Hx   . Rectal cancer Neg Hx   . Stomach cancer Neg Hx     No Known Allergies  Current Outpatient Prescriptions on File Prior to Visit  Medication Sig Dispense Refill  . Calcium Carbonate-Vitamin D (CALCIUM 600/VITAMIN  D) 600-400 MG-UNIT per tablet Take 1 tablet by mouth daily.    Marland Kitchen omeprazole (PRILOSEC) 40 MG capsule TAKE ONE CAPSULE BY MOUTH ONCE DAILY BEFORE BREAKFAST 30 capsule 1   No current facility-administered medications on file prior to visit.     BP (!) 108/58 (BP Location: Left Arm, Patient Position: Sitting, Cuff Size: Large)   Pulse 72   Temp 98.1 F (36.7 C) (Oral)   Ht 5\' 11"  (1.803 m)   Wt 191 lb (86.6 kg)   SpO2 98%   BMI 26.64 kg/m       Objective:   Physical Exam  Constitutional: He appears well-developed and well-nourished. No distress.  Neurological: He is alert.  Skin:  Right great toenail is hypertrophic. Discolored due to blood beneath nailbed. Mild erythema at the nail base   Psychiatric: He has a normal mood and affect. His behavior is normal. Judgment and thought content normal.            Assessment & Plan:  Subungual hematoma- will rx with keflex due to mild erythema at nail base. Advised patient that the nail may fall off. He  is advised to call if he develops increased pain, redness or swelling of your toenail.

## 2016-08-14 NOTE — Progress Notes (Signed)
Pre visit review using our clinic review tool, if applicable. No additional management support is needed unless otherwise documented below in the visit note. 

## 2016-09-14 ENCOUNTER — Telehealth: Payer: Self-pay | Admitting: Internal Medicine

## 2016-09-14 MED ORDER — OMEPRAZOLE 40 MG PO CPDR: DELAYED_RELEASE_CAPSULE | ORAL | 5 refills | Status: DC

## 2016-09-14 NOTE — Telephone Encounter (Signed)
Omeprazole refill sent in as requested.

## 2016-11-09 DIAGNOSIS — L821 Other seborrheic keratosis: Secondary | ICD-10-CM | POA: Diagnosis not present

## 2016-11-09 DIAGNOSIS — D235 Other benign neoplasm of skin of trunk: Secondary | ICD-10-CM | POA: Diagnosis not present

## 2016-11-09 DIAGNOSIS — L814 Other melanin hyperpigmentation: Secondary | ICD-10-CM | POA: Diagnosis not present

## 2016-11-09 DIAGNOSIS — L570 Actinic keratosis: Secondary | ICD-10-CM | POA: Diagnosis not present

## 2016-12-21 DIAGNOSIS — H524 Presbyopia: Secondary | ICD-10-CM | POA: Diagnosis not present

## 2016-12-21 DIAGNOSIS — Z961 Presence of intraocular lens: Secondary | ICD-10-CM | POA: Diagnosis not present

## 2016-12-25 DIAGNOSIS — N402 Nodular prostate without lower urinary tract symptoms: Secondary | ICD-10-CM | POA: Diagnosis not present

## 2016-12-25 DIAGNOSIS — N485 Ulcer of penis: Secondary | ICD-10-CM | POA: Diagnosis not present

## 2017-01-22 ENCOUNTER — Telehealth: Payer: Self-pay | Admitting: Family

## 2017-01-22 NOTE — Telephone Encounter (Signed)
Called patient to schedule awv. Lvm for patient to call office to schedule appt.  °

## 2017-02-02 ENCOUNTER — Telehealth: Payer: Self-pay | Admitting: Family

## 2017-02-02 NOTE — Telephone Encounter (Signed)
Patient called office regarding awv. Patient stated that his wife has an appt on 02/22/17 and that he will make his wellness appt at that time.

## 2017-02-11 DIAGNOSIS — N485 Ulcer of penis: Secondary | ICD-10-CM | POA: Diagnosis not present

## 2017-02-11 DIAGNOSIS — N402 Nodular prostate without lower urinary tract symptoms: Secondary | ICD-10-CM | POA: Diagnosis not present

## 2017-02-12 ENCOUNTER — Other Ambulatory Visit: Payer: Self-pay | Admitting: Urology

## 2017-03-01 ENCOUNTER — Encounter (HOSPITAL_BASED_OUTPATIENT_CLINIC_OR_DEPARTMENT_OTHER): Payer: Self-pay | Admitting: *Deleted

## 2017-03-01 NOTE — Progress Notes (Signed)
NPO AFTER MN.  ARRIVE AT 0600.  NEEDS HG.  WILL TAKE PRILOSEC AM DOS W/ SIPS OF WATER.

## 2017-03-10 NOTE — H&P (Signed)
Office Visit Report     02/11/2017   --------------------------------------------------------------------------------   Miguel Thomas  MRN: [id number]  PRIMARY CARE:  Earlie Counts  DOB: 1941-09-01, 76 year old Male  REFERRING:  Georgette Dover, MD  SSN: -[id number]  PROVIDER:  Festus Aloe, M.D.    LOCATION:  [facility] [phone]   --------------------------------------------------------------------------------   CC/HPI: Early Feb 2018pt noticed a "blood blister" on the left glans. It's a spot about as big as an eraser. He noticed some blood spots on the penis. It turned into an area of ulcer that appeared dry and granulating. He returns for repeat exam. The has not gone away but not getting any bigger. The scab will fall off and he'll see blood then a new scab will form.     CC: I have a prostate nodule.  HPI: Miguel Thomas is a 76 year-old male established patient who is here because he has a prostate nodule.  His prostate nodule was discovered approximately 12/03/2010. He has had a prostate biopsy done. His first prostate biopsy was done approximately 09/03/2011. He does have the pathology report from his biopsy. He has had a PSA done. His PSA has always been normal.   -Nov 2012 PSA 1.15, left base  -Jan 2013 TRUS bx benign; 20 g prostate  -Jan 2014 PSA 0.79, induration along left base - stable  -Feb 2018 -- Nl DRE; PSA 0.58   He also has a history of vitiligo of the penis and a spermatocele.   His PSA was low at 0.58.     ALLERGIES: No Allergies    MEDICATIONS: Calcium TABS Oral  Omeprazole 20 MG Oral Capsule Delayed Release Oral     GU PSH: No GU PSH      PSH Notes: Inguinal Hernia Repair, Cataract Surgery   NON-GU PSH: No Non-GU PSH    GU PMH: Nodular prostate w/o LUTS - 12/25/2016, Nodular prostate without lower urinary tract symptoms, - 2016 Ulcer of penis - 12/25/2016 BPH w/LUTS, Benign prostatic hyperplasia with urinary obstruction -  12/23/2015 Spermatocele of epididymis, Unspec, Spermatocele - 12/23/2015 BPH w/o LUTS, Benign prostatic hypertrophy without lower urinary tract symptoms - 2015    NON-GU PMH: Encounter for general adult medical examination without abnormal findings, Encounter for preventive health examination - 2015 Personal history of other diseases of the digestive system, History of esophageal reflux - 2014    FAMILY HISTORY: Family Health Status Number - Runs In Family Father Deceased At Age10 ___ - [place] In Family Mother Deceased At Age 60 from diabetic complicati - Runs In Family No pertinent family history - Other   SOCIAL HISTORY: Marital Status: Married Current Smoking Status: Patient has never smoked.   Tobacco Use Assessment Completed: Used Tobacco in last 30 days? Drinks 4+ caffeinated drinks per day.     Notes: Never A Smoker, Alcohol Use, Occupation:, Caffeine Use, Marital History - Currently Married   REVIEW OF SYSTEMS:    GU Review Male:   Patient denies frequent urination, hard to postpone urination, burning/ pain with urination, get up at night to urinate, leakage of urine, stream starts and stops, trouble starting your stream, have to strain to urinate , erection problems, and penile pain.  Gastrointestinal (Upper):   Patient denies nausea, vomiting, and indigestion/ heartburn.  Gastrointestinal (Lower):   Patient denies diarrhea and constipation.  Constitutional:   Patient denies fever, night sweats, weight loss, and fatigue.  Skin:   Patient denies skin rash/ lesion and  itching.  Eyes:   Patient denies blurred vision and double vision.  Ears/ Nose/ Throat:   Patient denies sore throat and sinus problems.  Hematologic/Lymphatic:   Patient denies swollen glands and easy bruising.  Cardiovascular:   Patient denies chest pains and leg swelling.  Respiratory:   Patient denies cough and shortness of breath.  Endocrine:   Patient denies excessive thirst.  Musculoskeletal:   Patient denies  back pain and joint pain.  Neurological:   Patient denies headaches and dizziness.  Psychologic:   Patient denies depression and anxiety.   VITAL SIGNS:      02/11/2017 09:52 AM  Weight 183 lb / 83.01 kg  Height 71 in / 180.34 cm  BP 160/74 mmHg  Heart Rate 78 /min  BMI 25.5 kg/m   GU PHYSICAL EXAMINATION:    Penis: Glanular painless lesion remains - left distal about a cm from meatus. Hard and granulated - about 5 mm. Another one on the right ventral glans about 1 mm. Circumcised, no foreskin warts, no cracks. No dorsal peyronie's plaques, no left corporal peyronie's plaques, no right corporal peyronie's plaques, no scarring, no shaft warts. No balanitis, no meatal stenosis.    MULTI-SYSTEM PHYSICAL EXAMINATION:    Constitutional: Well-nourished. No physical deformities. Normally developed. Good grooming.  Neck: Neck symmetrical, not swollen. Normal tracheal position.  Respiratory: No labored breathing, no use of accessory muscles.   Cardiovascular: Normal temperature, normal extremity pulses, no swelling, no varicosities.  Skin: No paleness, no jaundice, no cyanosis. No lesion, no ulcer, no rash.  Neurologic / Psychiatric: Oriented to time, oriented to place, oriented to person. No depression, no anxiety, no agitation.     PAST DATA REVIEWED:  Source Of History:  Patient   12/25/16 12/24/15 12/04/14 11/25/12 09/23/11 09/03/10 09/04/09  PSA  Total PSA 0.58 ng/dl 0.64  0.77  0.79  1.15  1.17  1.11     PROCEDURES:          Urinalysis Dipstick Dipstick Cont'd  Color: Yellow Bilirubin: Neg  Appearance: Clear Ketones: Neg  Specific Gravity: 1.020 Blood: Neg  pH: 5.5 Protein: Neg  Glucose: Neg Urobilinogen: 0.2    Nitrites: Neg    Leukocyte Esterase: Neg    ASSESSMENT:      ICD-10 Details  1 GU:   Nodular prostate w/o LUTS - N40.2   2   Ulcer of penis - N48.5    PLAN:           Schedule Return Visit/Planned Activity: Next Available Appointment - Schedule Surgery           Document Letter(s):  Created for Patient: Clinical Summary         Notes:   Nonhealing, hard lesion of left distal plans-I recommended excisional biopsy. We discussed the nature of risks benefits and alternatives. We discussed risk of bleeding, scarring, infection, poor cosmesis and sexual dysfunction among others. All questions answered. He elects to proceed. This a much smaller area which could be the start of a similar lesion at the right ventral glans and we can biopsy this as well. He joked and said not to make it any "shorter". He does see a dermatologist and has had some lesions removed from the scalp and I told him he can show it to his dermatologist but I felt certain they would agree needs to be biopsied. The patient admitted he actually thinks it may have developed early January.   cc: Dr. Edwena Blow     * Signed  by Festus Aloe, M.D. on 02/11/17 at 5:08 PM (EDT)*     The information contained in this medical record document is considered private and confidential patient information. This information can only be used for the medical diagnosis and/or medical services that are being provided by the patient's selected caregivers. This information can only be distributed outside of the patient's care if the patient agrees and signs waivers of authorization for this information to be sent to an outside source or route.

## 2017-03-12 ENCOUNTER — Ambulatory Visit (HOSPITAL_BASED_OUTPATIENT_CLINIC_OR_DEPARTMENT_OTHER)
Admission: RE | Admit: 2017-03-12 | Discharge: 2017-03-12 | Disposition: A | Discharge status: Home / Self Care | Payer: Medicare Other | Source: Ambulatory Visit | Attending: Urology | Admitting: Urology

## 2017-03-12 ENCOUNTER — Ambulatory Visit (HOSPITAL_BASED_OUTPATIENT_CLINIC_OR_DEPARTMENT_OTHER): Payer: Medicare Other | Admitting: Anesthesiology

## 2017-03-12 ENCOUNTER — Encounter (HOSPITAL_BASED_OUTPATIENT_CLINIC_OR_DEPARTMENT_OTHER)
Admission: RE | Disposition: A | Discharge status: Home / Self Care | Payer: Self-pay | Source: Ambulatory Visit | Attending: Urology

## 2017-03-12 ENCOUNTER — Encounter (HOSPITAL_BASED_OUTPATIENT_CLINIC_OR_DEPARTMENT_OTHER): Payer: Self-pay | Admitting: *Deleted

## 2017-03-12 DIAGNOSIS — N480 Leukoplakia of penis: Secondary | ICD-10-CM | POA: Insufficient documentation

## 2017-03-12 DIAGNOSIS — N509 Disorder of male genital organs, unspecified: Secondary | ICD-10-CM | POA: Diagnosis not present

## 2017-03-12 DIAGNOSIS — K219 Gastro-esophageal reflux disease without esophagitis: Secondary | ICD-10-CM | POA: Insufficient documentation

## 2017-03-12 DIAGNOSIS — N489 Disorder of penis, unspecified: Secondary | ICD-10-CM

## 2017-03-12 DIAGNOSIS — N485 Ulcer of penis: Secondary | ICD-10-CM | POA: Diagnosis not present

## 2017-03-12 DIAGNOSIS — D408 Neoplasm of uncertain behavior of other specified male genital organs: Secondary | ICD-10-CM | POA: Diagnosis not present

## 2017-03-12 DIAGNOSIS — K222 Esophageal obstruction: Secondary | ICD-10-CM | POA: Diagnosis not present

## 2017-03-12 DIAGNOSIS — Z79899 Other long term (current) drug therapy: Secondary | ICD-10-CM | POA: Insufficient documentation

## 2017-03-12 DIAGNOSIS — N4889 Other specified disorders of penis: Secondary | ICD-10-CM | POA: Diagnosis present

## 2017-03-12 HISTORY — DX: Ulcer of penis: N48.5

## 2017-03-12 HISTORY — DX: Personal history of colonic polyps: Z86.010

## 2017-03-12 HISTORY — DX: Nodular prostate without lower urinary tract symptoms: N40.2

## 2017-03-12 HISTORY — DX: Personal history of adenomatous and serrated colon polyps: Z86.0101

## 2017-03-12 HISTORY — DX: Presence of external hearing-aid: Z97.4

## 2017-03-12 HISTORY — DX: Personal history of other diseases of the digestive system: Z87.19

## 2017-03-12 HISTORY — DX: Diaphragmatic hernia without obstruction or gangrene: K44.9

## 2017-03-12 HISTORY — PX: PENILE BIOPSY: SHX6013

## 2017-03-12 LAB — POCT HEMOGLOBIN-HEMACUE: Hemoglobin: 13.1 g/dL (ref 13.0–17.0)

## 2017-03-12 SURGERY — BIOPSY, PENIS
Anesthesia: General

## 2017-03-12 MED ORDER — LIDOCAINE 2% (20 MG/ML) 5 ML SYRINGE
INTRAMUSCULAR | Status: DC | PRN
  Administered 2017-03-12: 60 mg via INTRAVENOUS

## 2017-03-12 MED ORDER — PHENYLEPHRINE HCL 10 MG/ML IJ SOLN
INTRAMUSCULAR | Status: DC | PRN
  Administered 2017-03-12: 80 ug via INTRAVENOUS
  Administered 2017-03-12: 120 ug via INTRAVENOUS
  Administered 2017-03-12: 80 ug via INTRAVENOUS

## 2017-03-12 MED ORDER — LACTATED RINGERS IV SOLN
INTRAVENOUS | Status: DC
  Administered 2017-03-12: 06:00:00 via INTRAVENOUS
  Filled 2017-03-12: qty 1000

## 2017-03-12 MED ORDER — ONDANSETRON HCL 4 MG/2ML IJ SOLN
INTRAMUSCULAR | Status: AC
  Filled 2017-03-12: qty 2

## 2017-03-12 MED ORDER — PROPOFOL 10 MG/ML IV BOLUS
INTRAVENOUS | Status: DC | PRN
  Administered 2017-03-12: 140 mg via INTRAVENOUS

## 2017-03-12 MED ORDER — DEXAMETHASONE SODIUM PHOSPHATE 10 MG/ML IJ SOLN
INTRAMUSCULAR | Status: AC
  Filled 2017-03-12: qty 1

## 2017-03-12 MED ORDER — CEFAZOLIN SODIUM-DEXTROSE 2-4 GM/100ML-% IV SOLN
2.0000 g | INTRAVENOUS | Status: AC
  Administered 2017-03-12: 2 g via INTRAVENOUS
  Filled 2017-03-12: qty 100

## 2017-03-12 MED ORDER — FENTANYL CITRATE (PF) 100 MCG/2ML IJ SOLN
25.0000 ug | INTRAMUSCULAR | Status: DC | PRN
  Filled 2017-03-12: qty 1

## 2017-03-12 MED ORDER — LIDOCAINE 2% (20 MG/ML) 5 ML SYRINGE
INTRAMUSCULAR | Status: AC
  Filled 2017-03-12: qty 5

## 2017-03-12 MED ORDER — BUPIVACAINE HCL 0.25 % IJ SOLN
INTRAMUSCULAR | Status: DC | PRN
  Administered 2017-03-12: 10 mL

## 2017-03-12 MED ORDER — PROPOFOL 10 MG/ML IV BOLUS
INTRAVENOUS | Status: AC
  Filled 2017-03-12: qty 40

## 2017-03-12 MED ORDER — PROMETHAZINE HCL 25 MG/ML IJ SOLN
6.2500 mg | INTRAMUSCULAR | Status: DC | PRN
  Filled 2017-03-12: qty 1

## 2017-03-12 MED ORDER — CEFAZOLIN SODIUM-DEXTROSE 2-4 GM/100ML-% IV SOLN
INTRAVENOUS | Status: AC
  Filled 2017-03-12: qty 100

## 2017-03-12 MED ORDER — DEXAMETHASONE SODIUM PHOSPHATE 4 MG/ML IJ SOLN
INTRAMUSCULAR | Status: DC | PRN
  Administered 2017-03-12: 10 mg via INTRAVENOUS

## 2017-03-12 MED ORDER — ONDANSETRON HCL 4 MG/2ML IJ SOLN
INTRAMUSCULAR | Status: DC | PRN
  Administered 2017-03-12: 4 mg via INTRAVENOUS

## 2017-03-12 MED ORDER — FENTANYL CITRATE (PF) 100 MCG/2ML IJ SOLN
INTRAMUSCULAR | Status: AC
  Filled 2017-03-12: qty 2

## 2017-03-12 MED ORDER — FENTANYL CITRATE (PF) 100 MCG/2ML IJ SOLN
INTRAMUSCULAR | Status: DC | PRN
  Administered 2017-03-12: 25 ug via INTRAVENOUS

## 2017-03-12 SURGICAL SUPPLY — 32 items
BLADE SURG 15 STRL LF DISP TIS (BLADE) ×1 IMPLANT
BLADE SURG 15 STRL SS (BLADE) ×2
BNDG COHESIVE 1X5 TAN STRL LF (GAUZE/BANDAGES/DRESSINGS) ×2 IMPLANT
CLOTH BEACON ORANGE TIMEOUT ST (SAFETY) ×2 IMPLANT
COVER BACK TABLE 60X90IN (DRAPES) ×2 IMPLANT
COVER MAYO STAND STRL (DRAPES) ×2 IMPLANT
DRAIN PENROSE 18X1/4 LTX STRL (WOUND CARE) ×1 IMPLANT
DRAPE LAPAROTOMY 100X72 PEDS (DRAPES) ×2 IMPLANT
DRSG TELFA 3X8 NADH (GAUZE/BANDAGES/DRESSINGS) ×2 IMPLANT
ELECT REM PT RETURN 9FT ADLT (ELECTROSURGICAL) ×2
ELECTRODE REM PT RTRN 9FT ADLT (ELECTROSURGICAL) ×1 IMPLANT
GAUZE VASELINE 1X8 (GAUZE/BANDAGES/DRESSINGS) ×1 IMPLANT
GLOVE BIO SURGEON STRL SZ7.5 (GLOVE) ×2 IMPLANT
GOWN STRL REUS W/ TWL LRG LVL3 (GOWN DISPOSABLE) ×1 IMPLANT
GOWN STRL REUS W/ TWL XL LVL3 (GOWN DISPOSABLE) ×1 IMPLANT
GOWN STRL REUS W/TWL LRG LVL3 (GOWN DISPOSABLE) ×2
GOWN STRL REUS W/TWL XL LVL3 (GOWN DISPOSABLE) ×2
KIT RM TURNOVER CYSTO AR (KITS) ×2 IMPLANT
MANIFOLD NEPTUNE II (INSTRUMENTS) IMPLANT
NDL HYPO 25X1 1.5 SAFETY (NEEDLE) ×1 IMPLANT
NEEDLE HYPO 25X1 1.5 SAFETY (NEEDLE) ×2 IMPLANT
NS IRRIG 500ML POUR BTL (IV SOLUTION) IMPLANT
PACK BASIN DAY SURGERY FS (CUSTOM PROCEDURE TRAY) ×2 IMPLANT
PAD DRESSING TELFA 3X8 NADH (GAUZE/BANDAGES/DRESSINGS) IMPLANT
PENCIL BUTTON HOLSTER BLD 10FT (ELECTRODE) ×2 IMPLANT
SUT CHROMIC 3 0 PS 2 (SUTURE) ×1 IMPLANT
SUT CHROMIC 3 0 SH 27 (SUTURE) ×2 IMPLANT
SUT SILK 2 0 (SUTURE)
SUT SILK 2-0 18XBRD TIE 12 (SUTURE) IMPLANT
SYR CONTROL 10ML LL (SYRINGE) ×1 IMPLANT
TUBE CONNECTING 12X1/4 (SUCTIONS) ×1 IMPLANT
WATER STERILE IRR 500ML POUR (IV SOLUTION) ×1 IMPLANT

## 2017-03-12 NOTE — Transfer of Care (Signed)
Immediate Anesthesia Transfer of Care Note  Patient: Miguel Thomas  Procedure(s) Performed: Procedure(s): EXCISIONAL GLANS PENILE BIOPSY (N/A)  Patient Location: PACU  Anesthesia Type:General  Level of Consciousness: awake, alert , oriented and patient cooperative  Airway & Oxygen Therapy: Patient Spontanous Breathing and Patient connected to nasal cannula oxygen  Post-op Assessment: Report given to RN and Post -op Vital signs reviewed and stable  Post vital signs: Reviewed and stable  Last Vitals:  Vitals:   03/12/17 0608 03/12/17 0834  BP: (!) 141/69   Pulse: 73   Resp: 16   Temp: 37 C 36.8 C    Last Pain:  Vitals:   03/12/17 0608  TempSrc: Oral      Patients Stated Pain Goal: 6 (76/80/88 1103)  Complications: No apparent anesthesia complications

## 2017-03-12 NOTE — Interval H&P Note (Signed)
History and Physical Interval Note:  03/12/2017 7:40 AM  Miguel Thomas  has presented today for surgery, with the diagnosis of ulcer of penis  The various methods of treatment have been discussed with the patient and family. After consideration of risks, benefits and other options for treatment, the patient has consented to  Procedure(s): EXCISIONAL GLANS PENILE BIOPSY (N/A) as a surgical intervention .  The patient's history has been reviewed, patient examined, no change in status, stable for surgery.  The area has improved. The scab fell of. On exam, there remains and dark lesion, somewhat irregular and indurated. We discussed continued surveillance vs excisional biopsy of this lesion and the smaller inferior lesion. He elects to proceed with biopsy. I have reviewed the patient's chart and labs.  Questions were answered to the patient's satisfaction.     [name]

## 2017-03-12 NOTE — Anesthesia Procedure Notes (Signed)
Procedure Name: LMA Insertion Date/Time: 03/12/2017 7:47 AM Performed by: Wanita Chamberlain Pre-anesthesia Checklist: Patient identified, Emergency Drugs available, Suction available, Patient being monitored and Timeout performed Patient Re-evaluated:Patient Re-evaluated prior to inductionOxygen Delivery Method: Circle system utilized Preoxygenation: Pre-oxygenation with 100% oxygen Intubation Type: IV induction Ventilation: Mask ventilation without difficulty LMA: LMA inserted LMA Size: 4.0 Number of attempts: 1 Placement Confirmation: breath sounds checked- equal and bilateral and positive ETCO2 Tube secured with: Tape Dental Injury: Teeth and Oropharynx as per pre-operative assessment

## 2017-03-12 NOTE — Anesthesia Preprocedure Evaluation (Addendum)
Anesthesia Evaluation  Patient identified by MRN, date of birth, ID band Patient awake    Reviewed: Allergy & Precautions, NPO status , Patient's Chart, lab work & pertinent test results  Airway Mallampati: II  TM Distance: >3 FB Neck ROM: Full    Dental no notable dental hx. (+) Teeth Intact, Dental Advisory Given, Caps,    Pulmonary neg pulmonary ROS,    Pulmonary exam normal breath sounds clear to auscultation       Cardiovascular negative cardio ROS Normal cardiovascular exam Rhythm:Regular Rate:Normal     Neuro/Psych negative neurological ROS  negative psych ROS   GI/Hepatic Neg liver ROS, hiatal hernia, GERD  Medicated,  Endo/Other  negative endocrine ROS  Renal/GU negative Renal ROS  negative genitourinary   Musculoskeletal negative musculoskeletal ROS (+)   Abdominal   Peds negative pediatric ROS (+)  Hematology negative hematology ROS (+)   Anesthesia Other Findings   Reproductive/Obstetrics negative OB ROS                           Anesthesia Physical Anesthesia Plan  ASA: II  Anesthesia Plan: General   Post-op Pain Management:    Induction: Intravenous  Airway Management Planned: LMA  Additional Equipment:   Intra-op Plan:   Post-operative Plan: Extubation in OR  Informed Consent: I have reviewed the patients History and Physical, chart, labs and discussed the procedure including the risks, benefits and alternatives for the proposed anesthesia with the patient or authorized representative who has indicated his/her understanding and acceptance.   Dental advisory given  Plan Discussed with: CRNA and Surgeon  Anesthesia Plan Comments:         Anesthesia Quick Evaluation

## 2017-03-12 NOTE — Op Note (Signed)
Preoperative diagnosis: Penile lesions of uncertain malignant potential Postoperative diagnosis: Same  Procedure: Excisional biopsy of the glans penis 2 (10 mm, 5 mm)  Surgeon: Junious Silk  Anesthesia: Gen. and local penile block  Indication for procedure, 76 year old with nonhealing crusty lesions at the distal glans. He was brought for biopsy.  Findings: As above crusty dark lesions of the glans 1 on the left at about 3:00 and 10 mm, one the inferior to the meatus on the right at 7:00 about 5 mm. A picture was uploaded to this chart.  Description of procedure after consent was obtained. The patient brought to the operating room. After adequate anesthesia the penis was prepped and draped in the usual sterile fashion. A timeout was performed to confirm the patient and procedure. A tourniquet was applied to the penis. Under loupe magnification, the left glans lesions was marked and an excisional biopsy with a 15 blade was performed. Clean tissue was noted underneath with entry just into the spongy tissue. The small wound was irrigated and the site run closed with a 3-0 chromic suture on a tapered needle. Similarly the right inferior lesion was biopsied with a 15 blade and excised. Clean tissue was noted underneath and it was closed with 3 throws of a running 3-0 chromic suture on a tapered needle. The tourniquet was released. Total tourniquet time was about 10 minutes. The glans and the penis was healthy and viable. A 10 mL quarter percent Marcaine penile block was placed. Hemostasis was excellent. The incisions were covered with a Telfa and he was awakened and taken to the recovery room in stable condition.   Complications: None Blood loss: Minimal Drains: None  Specimens to pathology: #1 left glans excisional biopsy, #2 right glans excisional biopsy

## 2017-03-12 NOTE — Anesthesia Postprocedure Evaluation (Signed)
Anesthesia Post Note  Patient: Miguel Thomas  Procedure(s) Performed: Procedure(s) (LRB): EXCISIONAL GLANS PENILE BIOPSY (N/A)  Patient location during evaluation: PACU Anesthesia Type: General Level of consciousness: awake and alert Pain management: pain level controlled Vital Signs Assessment: post-procedure vital signs reviewed and stable Respiratory status: spontaneous breathing, nonlabored ventilation, respiratory function stable and patient connected to nasal cannula oxygen Cardiovascular status: blood pressure returned to baseline and stable Postop Assessment: no signs of nausea or vomiting Anesthetic complications: no       Last Vitals:  Vitals:   03/12/17 0834 03/12/17 0845  BP: 125/67 124/66  Pulse: 68 63  Resp: 12 14  Temp: 36.8 C     Last Pain:  Vitals:   03/12/17 0834  TempSrc:   PainSc: Asleep                 Takeysha Bonk S

## 2017-03-12 NOTE — Addendum Note (Signed)
Addendum  created 03/12/17 0917 by Wanita Chamberlain, CRNA   [facility] administration accepted

## 2017-03-12 NOTE — Discharge Instructions (Signed)
Incision Care, Adult An incision is a cut that a doctor makes in your skin for surgery (for a procedure). Most times, these cuts are closed after surgery. Your cut from surgery may be closed with stitches (sutures), staples, skin glue, or skin tape (adhesive strips). You may need to return to your doctor to have stitches or staples taken out. This may happen many days or many weeks after your surgery. The cut needs to be well cared for so it does not get infected. How to care for your cut Cut care   Follow instructions from your doctor about how to take care of your cut. Make sure you:  Wash your hands with soap and water before you change your bandage (dressing). If you cannot use soap and water, use hand sanitizer.  Change your bandage as told by your doctor.  Leave stitches, skin glue, or skin tape in place. They may need to stay in place for 2 weeks or longer.   Check your cut area every day for signs of infection. Check for:  More redness, swelling, or pain.  More fluid or blood.  Warmth.  Pus or a bad smell.  You may shower.   Rinse the incisions with mild soap and water.  Using a clean towel to pat the cut dry after you clean it.  Covering the cut with a clean bandage   Do not take baths, swim, or use a hot tub until your doctor says it is okay. Ask your doctor if you can take showers. You may only be allowed to take sponge baths for bathing. General instructions   Limit movement around your cut. This helps healing.  Avoid straining, lifting, or exercise for the first month, or for as long as told by your doctor.  Follow instructions from your doctor about going back to your normal activities.  Avoid sexual activity until the sutures dissolve   Protect your cut from the sun when you are outside for the first 6 months, or for as long as told by your doctor. Put on sunscreen around the scar or cover up the scar.  Keep all follow-up visits as told by your doctor. This  is important.  You may take over the counter acetaminophen or ibuprofen for pain  Contact a doctor if:  Your have more redness, swelling, or pain around the cut.  You have more fluid or blood coming from the cut.  Your cut feels warm to the touch.  You have pus or a bad smell coming from the cut.  You have a fever or shaking chills.  You feel sick to your stomach (nauseous) or you throw up (vomit).  You are dizzy.  Your stitches or staples come undone. Get help right away if:  You have a red streak coming from your cut.  Your cut bleeds through the bandage and the bleeding does not stop with gentle pressure.  The edges of your cut open up and separate.  You have very bad (severe) pain.  You have a rash.  You are confused.  You pass out (faint).  You have trouble breathing and you have a fast heartbeat. This information is not intended to replace advice given to you by your health care provider. Make sure you discuss any questions you have with your health care provider. Document Released: 01/11/2012 Document Revised: 06/26/2016 Document Reviewed: 06/26/2016 Elsevier Interactive Patient Education  2017 [facility] Instructions  Activity: Get plenty of rest for  the remainder of the day. A responsible individual must stay with you for 24 hours following the procedure.  For the next 24 hours, DO NOT: -Drive a car -Paediatric nurse -Drink alcoholic beverages -Take any medication unless instructed by your physician -Make any legal decisions or sign important papers.  Meals: Start with liquid foods such as gelatin or soup. Progress to regular foods as tolerated. Avoid greasy, spicy, heavy foods. If nausea and/or vomiting occur, drink only clear liquids until the nausea and/or vomiting subsides. Call your physician if vomiting continues.  Special Instructions/Symptoms: Your throat may feel dry or sore from the anesthesia or the breathing  tube placed in your throat during surgery. If this causes discomfort, gargle with warm salt water. The discomfort should disappear within 24 hours.  If you had a scopolamine patch placed behind your ear for the management of post- operative nausea and/or vomiting:  1. The medication in the patch is effective for 72 hours, after which it should be removed.  Wrap patch in a tissue and discard in the trash. Wash hands thoroughly with soap and water. 2. You may remove the patch earlier than 72 hours if you experience unpleasant side effects which may include dry mouth, dizziness or visual disturbances. 3. Avoid touching the patch. Wash your hands with soap and water after contact with the patch.

## 2017-03-15 ENCOUNTER — Encounter (HOSPITAL_BASED_OUTPATIENT_CLINIC_OR_DEPARTMENT_OTHER): Payer: Self-pay | Admitting: Urology

## 2017-03-26 DIAGNOSIS — L900 Lichen sclerosus et atrophicus: Secondary | ICD-10-CM | POA: Diagnosis not present

## 2017-04-05 NOTE — Anesthesia Postprocedure Evaluation (Signed)
Anesthesia Post Note  Patient: Miguel Thomas  Procedure(s) Performed: Procedure(s) (LRB): EXCISIONAL GLANS PENILE BIOPSY (N/A)     Anesthesia Post Evaluation  Last Vitals:  Vitals:   03/12/17 0930 03/12/17 1015  BP: 120/64 106/68  Pulse: 72 68  Resp: 17 16  Temp:  36.7 C    Last Pain:  Vitals:   03/12/17 0834  TempSrc:   PainSc: Asleep                 Nikolaos Maddocks S

## 2017-04-05 NOTE — Addendum Note (Signed)
Addendum  created 04/05/17 1435 by Myrtie Soman, MD   Sign clinical note

## 2017-04-09 NOTE — Progress Notes (Signed)
Subjective:   Miguel Thomas is a 76 y.o. male who presents for Medicare Annual/Subsequent preventive examination.  Review of Systems:  No ROS.  Medicare Wellness Visit.  Cardiac Risk Factors include: advanced age (>98men, >54 women) Sleep patterns: only sleeps about 6 hrs per night. Feels rested  Home Safety/Smoke Alarms: Feels safe in home. Smoke alarms in place.  Living environment; residence and Firearm Safety: Lives with wife and adult son. One story. No guns. Seat Belt Safety/Bike Helmet: Wears seat belt.   Counseling:   Eye Exam- Wears glasses for reading and driving. Dr.Shapiro annually. Hx cataract sx about 10 yrs ago per pt. Dental- every 6 months.  Male:   CCS- Last 12/04/15:   2 polyps removed by Dr Carlean Purl. No further routine screenings needed per report.   PSA-  Lab Results  Component Value Date   PSA 0.64--[facility] 12/24/2015   PSA 0.87 01/11/2007       Objective:    Vitals: BP 138/68 (BP Location: Right Arm, Patient Position: Sitting, Cuff Size: Normal)   Pulse 68   Ht 6' (1.829 m)   Wt 191 lb 3.2 oz (86.7 kg)   SpO2 99%   BMI 25.93 kg/m   Body mass index is 25.93 kg/m.  Tobacco History  Smoking Status  . Never Smoker  Smokeless Tobacco  . Never Used     Counseling given: No   Past Medical History:  Diagnosis Date  . GERD (gastroesophageal reflux disease) 12-12-04   ring like esophageal stricture with reflux esophagitis, also in 2011  . Hiatal hernia   . History of adenomatous polyp of colon    tubular adenom's in 2006;  2011; 03-05-2015  . History of esophageal stricture    s/p  dilatation 2006  and 2012  . Penile ulcer   . Prostate nodule   . Wears hearing aid in both ears    Past Surgical History:  Procedure Laterality Date  . CARDIOVASCULAR STRESS TEST  06/11/2011   normal nuclear study w/ no ischemia/  normal LV function and wall motion, ef 58%  . CATARACT EXTRACTION W/ INTRAOCULAR LENS  IMPLANT, BILATERAL  2008 approx.  .  COLONOSCOPY  last one 03-05-2015  . ESOPHAGOGASTRODUODENOSCOPY  last one 01-06-2011  . INGUINAL HERNIA REPAIR Right 1990's  . PENILE BIOPSY N/A 03/12/2017   Procedure: EXCISIONAL GLANS PENILE BIOPSY;  Surgeon: Festus Aloe, MD;  Location: [facility];  Service: Urology;  Laterality: N/A;  . PROSTATE BIOPSY  11/2011   normal  . TONSILLECTOMY  child   Family History  Problem Relation Age of Onset  . Coronary artery disease Other   . Heart disease Other        rheumatic  . Colon cancer Neg Hx   . Esophageal cancer Neg Hx   . Rectal cancer Neg Hx   . Stomach cancer Neg Hx    History  Sexual Activity  . Sexual activity: Yes    Outpatient Encounter Prescriptions as of 04/12/2017  Medication Sig  . Calcium Carbonate-Vitamin D (CALCIUM 600/VITAMIN D) 600-400 MG-UNIT per tablet Take 1 tablet by mouth daily.  Marland Kitchen omeprazole (PRILOSEC) 40 MG capsule TAKE ONE CAPSULE BY MOUTH ONCE DAILY BEFORE BREAKFAST   No facility-administered encounter medications on file as of 04/12/2017.     Activities of Daily Living In your present state of health, do you have any difficulty performing the following activities: 04/12/2017 03/12/2017  Hearing? Tempie Donning  Vision? N N  Difficulty concentrating  or making decisions? N N  Walking or climbing stairs? N N  Dressing or bathing? N N  Doing errands, shopping? N -  Preparing Food and eating ? N -  Using the Toilet? N -  In the past six months, have you accidently leaked urine? N -  Do you have problems with loss of bowel control? N -  Managing your Medications? N -  Managing your Finances? N -  Housekeeping or managing your Housekeeping? N -  Some recent data might be hidden    Patient Care Team: [name], NP as PCP - General (Internal Medicine) Gatha Mayer, MD as Consulting Physician (Gastroenterology) Christy Sartorius, MD as Referring Physician (Urology)   Assessment:    Physical assessment deferred to PCP.  Exercise  Activities and Dietary recommendations Current Exercise Habits: Home exercise routine (Still push mows his own yard.)   Diet (meal preparation, eat out, water intake, caffeinated beverages, dairy products, fruits and vegetables): in general, a "healthy" diet    Goals    . 180lb          Diet and exercise.      Fall Risk Fall Risk  01/24/2016 02/01/2015 01/22/2015  Falls in the past year? No No No   Depression Screen PHQ 2/9 Scores 01/24/2016 02/01/2015 01/22/2015  PHQ - 2 Score 0 0 0    Cognitive Function Ad8 score reviewed for issues:  Issues making decisions:no  Less interest in hobbies / activities:no  Repeats questions, stories (family complaining):no  Trouble using ordinary gadgets (microwave, computer, phone):no  Forgets the month or year: no  Mismanaging finances: no  Remembering appts:no  Daily problems with thinking and/or memory:no Ad8 score is=0         Immunization History  Administered Date(s) Administered  . Influenza Whole 08/22/2007, 07/13/2009, 07/19/2010  . Influenza, High Dose Seasonal PF 07/23/2015, 08/11/2016  . Influenza-Unspecified 08/02/2014  . Pneumococcal Conjugate-13 01/22/2015  . Pneumococcal Polysaccharide-23 02/14/2007  . Td 05/19/2004, 01/22/2015  . Zoster 11/02/2009   Screening Tests Health Maintenance  Topic Date Due  . INFLUENZA VACCINE  06/02/2017  . TETANUS/TDAP  01/21/2025  . PNA vac Low Risk Adult  Completed      Plan:  Follow up with [name], NP as scheduled.  Continue to eat heart healthy diet (full of fruits, vegetables, whole grains, lean protein, water--limit salt, fat, and sugar intake) and increase physical activity as tolerated.  Continue doing brain stimulating activities (puzzles, reading, adult coloring books, staying active) to keep memory sharp.   Bring a copy of your advance directives to your next office visit.   I have personally reviewed and noted the following in the patient's chart:    . Medical and social history . Use of alcohol, tobacco or illicit drugs  . Current medications and supplements . Functional ability and status . Nutritional status . Physical activity . Advanced directives . List of other physicians . Hospitalizations, surgeries, and ER visits in previous 12 months . Vitals . Screenings to include cognitive, depression, and falls . Referrals and appointments  In addition, I have reviewed and discussed with patient certain preventive protocols, quality metrics, and best practice recommendations. A written personalized care plan for preventive services as well as general preventive health recommendations were provided to patient.     Shela Nevin, [place]  04/12/2017

## 2017-04-12 ENCOUNTER — Encounter: Payer: Self-pay | Admitting: *Deleted

## 2017-04-12 ENCOUNTER — Ambulatory Visit (INDEPENDENT_AMBULATORY_CARE_PROVIDER_SITE_OTHER): Payer: Medicare Other | Admitting: *Deleted

## 2017-04-12 VITALS — BP 138/68 | HR 68 | Ht 72.0 in | Wt 191.2 lb

## 2017-04-12 DIAGNOSIS — Z0000 Encounter for general adult medical examination without abnormal findings: Secondary | ICD-10-CM | POA: Diagnosis not present

## 2017-04-12 NOTE — Progress Notes (Signed)
Reviewed and agree.

## 2017-04-12 NOTE — Addendum Note (Signed)
Addended by: [name] on: 04/12/2017 04:26 PM   Modules accepted: Level of Service

## 2017-04-12 NOTE — Patient Instructions (Signed)
[name] , Thank you for taking time to come for your Medicare Wellness Visit. I appreciate your ongoing commitment to your health goals. Please review the following plan we discussed and let me know if I can assist you in the future.   These are the goals we discussed: Goals    . 180lb          Diet and exercise.       This is a list of the screening recommended for you and due dates:  Health Maintenance  Topic Date Due  . Flu Shot  06/02/2017  . Tetanus Vaccine  01/21/2025  . Pneumonia vaccines  Completed   Follow up with Miguel Alar, NP as scheduled.  Continue to eat heart healthy diet (full of fruits, vegetables, whole grains, lean protein, water--limit salt, fat, and sugar intake) and increase physical activity as tolerated.  Continue doing brain stimulating activities (puzzles, reading, adult coloring books, staying active) to keep memory sharp.   Bring a copy of your advance directives to your next office visit. Health Maintenance, Male A healthy lifestyle and preventive care is important for your health and wellness. Ask your health care provider about what schedule of regular examinations is right for you. What should I know about weight and diet? Eat a Healthy Diet  Eat plenty of vegetables, fruits, whole grains, low-fat dairy products, and lean protein.  Do not eat a lot of foods high in solid fats, added sugars, or salt.  Maintain a Healthy Weight Regular exercise can help you achieve or maintain a healthy weight. You should:  Do at least 150 minutes of exercise each week. The exercise should increase your heart rate and make you sweat (moderate-intensity exercise).  Do strength-training exercises at least twice a week.  Watch Your Levels of Cholesterol and Blood Lipids  Have your blood tested for lipids and cholesterol every 5 years starting at 78 years of age. If you are at high risk for heart disease, you should start having your blood tested when  you are 78 years old. You may need to have your cholesterol levels checked more often if: ? Your lipid or cholesterol levels are high. ? You are older than 78 years of age. ? You are at high risk for heart disease.  What should I know about cancer screening? Many types of cancers can be detected early and may often be prevented. Lung Cancer  You should be screened every year for lung cancer if: ? You are a current smoker who has smoked for at least 30 years. ? You are a former smoker who has quit within the past 15 years.  Talk to your health care provider about your screening options, when you should start screening, and how often you should be screened.  Colorectal Cancer  Routine colorectal cancer screening usually begins at 78 years of age and should be repeated every 5-10 years until you are 78 years old. You may need to be screened more often if early forms of precancerous polyps or small growths are found. Your health care provider may recommend screening at an earlier age if you have risk factors for colon cancer.  Your health care provider may recommend using home test kits to check for hidden blood in the stool.  A small camera at the end of a tube can be used to examine your colon (sigmoidoscopy or colonoscopy). This checks for the earliest forms of colorectal cancer.  Prostate and Testicular Cancer  Depending on your  age and overall health, your health care provider may do certain tests to screen for prostate and testicular cancer.  Talk to your health care provider about any symptoms or concerns you have about testicular or prostate cancer.  Skin Cancer  Check your skin from head to toe regularly.  Tell your health care provider about any new moles or changes in moles, especially if: ? There is a change in a mole's size, shape, or color. ? You have a mole that is larger than a pencil eraser.  Always use sunscreen. Apply sunscreen liberally and repeat throughout the  day.  Protect yourself by wearing long sleeves, pants, a wide-brimmed hat, and sunglasses when outside.  What should I know about heart disease, diabetes, and high blood pressure?  If you are 56-87 years of age, have your blood pressure checked every 3-5 years. If you are 22 years of age or older, have your blood pressure checked every year. You should have your blood pressure measured twice-once when you are at a hospital or clinic, and once when you are not at a hospital or clinic. Record the average of the two measurements. To check your blood pressure when you are not at a hospital or clinic, you can use: ? An automated blood pressure machine at a pharmacy. ? A home blood pressure monitor.  Talk to your health care provider about your target blood pressure.  If you are between 12-60 years old, ask your health care provider if you should take aspirin to prevent heart disease.  Have regular diabetes screenings by checking your fasting blood sugar level. ? If you are at a normal weight and have a low risk for diabetes, have this test once every three years after the age of 50. ? If you are overweight and have a high risk for diabetes, consider being tested at a younger age or more often.  A one-time screening for abdominal aortic aneurysm (AAA) by ultrasound is recommended for men aged 35-75 years who are current or former smokers. What should I know about preventing infection? Hepatitis B If you have a higher risk for hepatitis B, you should be screened for this virus. Talk with your health care provider to find out if you are at risk for hepatitis B infection. Hepatitis C Blood testing is recommended for:  Everyone born from [birth year] through 1967.  Anyone with known risk factors for hepatitis C.  Sexually Transmitted Diseases (STDs)  You should be screened each year for STDs including gonorrhea and chlamydia if: ? You are sexually active and are younger than 78 years of age. ? You are  older than 78 years of age and your health care provider tells you that you are at risk for this type of infection. ? Your sexual activity has changed since you were last screened and you are at an increased risk for chlamydia or gonorrhea. Ask your health care provider if you are at risk.  Talk with your health care provider about whether you are at high risk of being infected with HIV. Your health care provider may recommend a prescription medicine to help prevent HIV infection.  What else can I do?  Schedule regular health, dental, and eye exams.  Stay current with your vaccines (immunizations).  Do not use any tobacco products, such as cigarettes, chewing tobacco, and e-cigarettes. If you need help quitting, ask your health care provider.  Limit alcohol intake to no more than 2 drinks per day. One drink equals 12 ounces  of beer, 5 ounces of wine, or 1 ounces of hard liquor.  Do not use street drugs.  Do not share needles.  Ask your health care provider for help if you need support or information about quitting drugs.  Tell your health care provider if you often feel depressed.  Tell your health care provider if you have ever been abused or do not feel safe at home. This information is not intended to replace advice given to you by your health care provider. Make sure you discuss any questions you have with your health care provider. Document Released: 04/16/2008 Document Revised: 06/17/2016 Document Reviewed: 07/23/2015 Elsevier Interactive Patient Education  [employer].

## 2017-04-20 ENCOUNTER — Ambulatory Visit (INDEPENDENT_AMBULATORY_CARE_PROVIDER_SITE_OTHER): Payer: Medicare Other | Admitting: Family

## 2017-04-20 ENCOUNTER — Encounter: Payer: Self-pay | Admitting: Family

## 2017-04-20 VITALS — BP 137/66 | HR 57 | Temp 97.9°F | Resp 16 | Ht 71.0 in | Wt 188.0 lb

## 2017-04-20 DIAGNOSIS — M8580 Other specified disorders of bone density and structure, unspecified site: Secondary | ICD-10-CM | POA: Diagnosis not present

## 2017-04-20 DIAGNOSIS — K219 Gastro-esophageal reflux disease without esophagitis: Secondary | ICD-10-CM

## 2017-04-20 NOTE — Progress Notes (Signed)
Subjective:    Patient ID: Miguel Thomas, male    DOB: May 26, 1941, 76 y.o.   MRN: [id number]  HPI  Miguel Thomas is a 76 yr old male who presents today for follow up.  1) GERD- reports that his symptoms are well controlled. Notes that if he tries to skip a few days he will develop heartburn.  2) Osteopenia- continues caltrate.   He had a penile biopsy which noted : BALANITIS XEROTIC obliterans- he is following with urology for this.   Review of Systems See HPI  Past Medical History:  Diagnosis Date  . GERD (gastroesophageal reflux disease) 12-12-04   ring like esophageal stricture with reflux esophagitis, also in 2011  . Hiatal hernia   . History of adenomatous polyp of colon    tubular adenom's in 2006;  2011; 03-05-2015  . History of esophageal stricture    s/p  dilatation 2006  and 2012  . Penile ulcer   . Prostate nodule   . Wears hearing aid in both ears      Social History   Social History  . Marital status: Married    Spouse name: N/A  . Number of children: N/A  . Years of education: N/A   Occupational History  . Not on file.   Social History Main Topics  . Smoking status: Never Smoker  . Smokeless tobacco: Never Used  . Alcohol use No  . Drug use: No  . Sexual activity: Yes   Other Topics Concern  . Not on file   Social History Narrative   Retired   Married (2nd marriage)   Daily Caffeine- use up to 6/ daily          Past Surgical History:  Procedure Laterality Date  . CARDIOVASCULAR STRESS TEST  06/11/2011   normal nuclear study w/ no ischemia/  normal LV function and wall motion, ef 58%  . CATARACT EXTRACTION W/ INTRAOCULAR LENS  IMPLANT, BILATERAL  2008 approx.  . COLONOSCOPY  last one 03-05-2015  . ESOPHAGOGASTRODUODENOSCOPY  last one 01-06-2011  . INGUINAL HERNIA REPAIR Right 1990's  . PENILE BIOPSY N/A 03/12/2017   Procedure: EXCISIONAL GLANS PENILE BIOPSY;  Surgeon: Festus Aloe, MD;  Location: [facility];  Service:  Urology;  Laterality: N/A;  . PROSTATE BIOPSY  11/2011   normal  . TONSILLECTOMY  child    Family History  Problem Relation Age of Onset  . Coronary artery disease Other   . Heart disease Other        rheumatic  . Colon cancer Neg Hx   . Esophageal cancer Neg Hx   . Rectal cancer Neg Hx   . Stomach cancer Neg Hx     No Known Allergies  Current Outpatient Prescriptions on File Prior to Visit  Medication Sig Dispense Refill  . Calcium Carbonate-Vitamin D (CALCIUM 600/VITAMIN D) 600-400 MG-UNIT per tablet Take 1 tablet by mouth daily.    Marland Kitchen omeprazole (PRILOSEC) 40 MG capsule TAKE ONE CAPSULE BY MOUTH ONCE DAILY BEFORE BREAKFAST 30 capsule 5   No current facility-administered medications on file prior to visit.     BP 137/66 (BP Location: Right Arm, Cuff Size: Normal)   Pulse (!) 57   Temp 97.9 F (36.6 C) (Oral)   Resp 16   Ht 5\' 11"  (1.803 m)   Wt 188 lb (85.3 kg)   SpO2 99%   BMI 26.22 kg/m       Objective:   Physical Exam  Constitutional:  He is oriented to person, place, and time. He appears well-developed and well-nourished. No distress.  HENT:  Head: Normocephalic and atraumatic.  Cardiovascular: Normal rate and regular rhythm.   No murmur heard. Pulmonary/Chest: Effort normal and breath sounds normal. No respiratory distress. He has no wheezes. He has no rales.  Musculoskeletal: He exhibits no edema.  Neurological: He is alert and oriented to person, place, and time.  Skin: Skin is warm and dry.  Psychiatric: He has a normal mood and affect. His behavior is normal. Thought content normal.          Assessment & Plan:

## 2017-04-20 NOTE — Assessment & Plan Note (Signed)
Stable on PPI. Continue same.  

## 2017-04-20 NOTE — Assessment & Plan Note (Signed)
Continue caltrate, will plan follow up dexa in 2019

## 2017-07-22 ENCOUNTER — Other Ambulatory Visit: Payer: Self-pay | Admitting: Internal Medicine

## 2017-07-23 ENCOUNTER — Other Ambulatory Visit: Payer: Self-pay | Admitting: Internal Medicine

## 2017-07-23 ENCOUNTER — Other Ambulatory Visit: Payer: Self-pay

## 2017-07-23 MED ORDER — OMEPRAZOLE 40 MG PO CPDR: DELAYED_RELEASE_CAPSULE | ORAL | 2 refills | Status: DC

## 2017-07-23 NOTE — Telephone Encounter (Signed)
Sent Rx with 2 refills until appt in November.

## 2017-08-19 DIAGNOSIS — Z23 Encounter for immunization: Secondary | ICD-10-CM | POA: Diagnosis not present

## 2017-09-22 ENCOUNTER — Encounter: Payer: Self-pay | Admitting: Internal Medicine

## 2017-09-22 ENCOUNTER — Ambulatory Visit (INDEPENDENT_AMBULATORY_CARE_PROVIDER_SITE_OTHER): Payer: Medicare Other | Admitting: Internal Medicine

## 2017-09-22 VITALS — BP 120/70 | HR 68 | Ht 69.25 in | Wt 191.0 lb

## 2017-09-22 DIAGNOSIS — R1310 Dysphagia, unspecified: Secondary | ICD-10-CM | POA: Diagnosis not present

## 2017-09-22 DIAGNOSIS — R1319 Other dysphagia: Secondary | ICD-10-CM

## 2017-09-22 DIAGNOSIS — K219 Gastro-esophageal reflux disease without esophagitis: Secondary | ICD-10-CM

## 2017-09-22 NOTE — Progress Notes (Signed)
   Miguel Thomas 76 y.o. 02/24/1941 [id number]  Assessment & Plan:   Encounter Diagnoses  Name Primary?  . Esophageal dysphagia Yes  . Gastroesophageal reflux disease, esophagitis presence not specified      Sounds like recurrent dysphagia possible. EGD, dilate esophagus planned The risks and benefits as well as alternatives of endoscopic procedure(s) have been discussed and reviewed. All questions answered. The patient agrees to proceed.  Cc:O'Sullivan, Melissa, NP  Subjective:   Chief Complaint: reflux regurgitation  HPI Elderly wm w/ hx GERD. He had an episode recently "first in a long time" where he could not swallow food and kept producing and regurgitating saliva. Went on for several hours.  Says wife thinks he belches too much.  Is on PPI - not much heartburn.  2 EGD and dilations of esophageal stx - 2006, 2012 Wt Readings from Last 3 Encounters:  09/22/17 191 lb (86.6 kg)  04/20/17 188 lb (85.3 kg)  04/12/17 191 lb 3.2 oz (86.7 kg)    No Known Allergies Current Meds  Medication Sig  . Calcium Carbonate-Vitamin D (CALCIUM 600/VITAMIN D) 600-400 MG-UNIT per tablet Take 1 tablet by mouth as needed.   Marland Kitchen omeprazole (PRILOSEC) 40 MG capsule TAKE ONE CAPSULE BY MOUTH ONCE DAILY BEFORE BREAKFAST   Past Medical History:  Diagnosis Date  . Anemia   . GERD (gastroesophageal reflux disease) 12-12-04   ring like esophageal stricture with reflux esophagitis, also in 2011  . Hiatal hernia   . History of adenomatous polyp of colon    tubular adenom's in 2006;  2011; 03-05-2015  . History of esophageal stricture    s/p  dilatation 2006  and 2012  . Osteopenia   . Penile ulcer   . Plantar fasciitis   . Prostate nodule   . Pseudophakia of both eyes   . Wears hearing aid in both ears    Past Surgical History:  Procedure Laterality Date  . CARDIOVASCULAR STRESS TEST  06/11/2011   normal nuclear study w/ no ischemia/  normal LV function and wall motion, ef 58%  .  CATARACT EXTRACTION W/ INTRAOCULAR LENS  IMPLANT, BILATERAL  2008 approx.  . COLONOSCOPY  last one 03-05-2015  . ESOPHAGOGASTRODUODENOSCOPY  last one 01-06-2011  . INGUINAL HERNIA REPAIR Right 1990's  . PENILE BIOPSY N/A 03/12/2017   Procedure: EXCISIONAL GLANS PENILE BIOPSY;  Surgeon: Festus Aloe, MD;  Location[facility];  Service: Urology;  Laterality: N/A;  . PROSTATE BIOPSY  11/2011   normal  . TONSILLECTOMY  child   Social History   Social History Narrative   Retired   Married (2nd marriage)   Daily Caffeine- use up to 6/ daily          family history includes Coronary artery disease in his other; Heart disease in his other.   Review of Systems s above  Objective:   Physical Exam @BP  120/70 (BP Location: Left Arm, Patient Position: Sitting, Cuff Size: Normal)   Pulse 68 Comment: irregular  Ht 5' 9.25" (1.759 m)   Wt 191 lb (86.6 kg)   BMI 28.00 kg/m @  General:  NAD Eyes:   anicteric Lungs:  clear Heart::  S1S2 no rubs, murmurs or gallops Abdomen:  soft and nontender, BS+ Ext:   no edema, cyanosis or clubbing    Data Reviewed:   Prior EGD reports Labs

## 2017-09-22 NOTE — Patient Instructions (Signed)

## 2017-09-27 ENCOUNTER — Telehealth: Payer: Self-pay | Admitting: Internal Medicine

## 2017-09-27 NOTE — Telephone Encounter (Signed)
OK no charge ?

## 2017-09-29 ENCOUNTER — Encounter: Payer: Self-pay | Admitting: Internal Medicine

## 2017-11-03 ENCOUNTER — Other Ambulatory Visit: Payer: Self-pay | Admitting: Internal Medicine

## 2017-12-23 DIAGNOSIS — L570 Actinic keratosis: Secondary | ICD-10-CM | POA: Diagnosis not present

## 2017-12-23 DIAGNOSIS — L821 Other seborrheic keratosis: Secondary | ICD-10-CM | POA: Diagnosis not present

## 2017-12-23 DIAGNOSIS — L814 Other melanin hyperpigmentation: Secondary | ICD-10-CM | POA: Diagnosis not present

## 2017-12-23 DIAGNOSIS — D225 Melanocytic nevi of trunk: Secondary | ICD-10-CM | POA: Diagnosis not present

## 2018-01-03 DIAGNOSIS — H52203 Unspecified astigmatism, bilateral: Secondary | ICD-10-CM | POA: Diagnosis not present

## 2018-01-03 DIAGNOSIS — Z961 Presence of intraocular lens: Secondary | ICD-10-CM | POA: Diagnosis not present

## 2018-01-03 DIAGNOSIS — H524 Presbyopia: Secondary | ICD-10-CM | POA: Diagnosis not present

## 2018-01-03 DIAGNOSIS — H538 Other visual disturbances: Secondary | ICD-10-CM | POA: Diagnosis not present

## 2018-01-03 DIAGNOSIS — H5213 Myopia, bilateral: Secondary | ICD-10-CM | POA: Diagnosis not present

## 2018-02-19 ENCOUNTER — Other Ambulatory Visit: Payer: Self-pay | Admitting: Internal Medicine

## 2018-03-16 DIAGNOSIS — N402 Nodular prostate without lower urinary tract symptoms: Secondary | ICD-10-CM | POA: Diagnosis not present

## 2018-03-16 DIAGNOSIS — R351 Nocturia: Secondary | ICD-10-CM | POA: Diagnosis not present

## 2018-04-13 ENCOUNTER — Ambulatory Visit: Payer: Medicare Other | Admitting: *Deleted

## 2018-04-13 NOTE — Progress Notes (Signed)
Subjective:   Miguel Thomas is a 77 y.o. male who presents for Medicare Annual/Subsequent preventive examination.  Review of Systems: No ROS.  Medicare Wellness Visit. Additional risk factors are reflected in the social history. Cardiac Risk Factors include: advanced age (>22men, >36 women) Sleep patterns:wakes a couple of time to urinate. Sleeps about 8 hrs.  Home Safety/Smoke Alarms: Feels safe in home. Smoke alarms in place.  Living environment; residence and Firearm Safety: Lives with wife and adult son. 1 story.  Male:   CCS- No longer doing routine screening due to age.     PSA-  Lab Results  Component Value Date   PSA 0.64--[facility] 12/24/2015   PSA 0.87 01/11/2007       Objective:    Vitals: BP 128/70 (BP Location: Left Arm, Patient Position: Sitting, Cuff Size: Normal)   Pulse 65   Ht 5' 10.5" (1.791 m)   Wt 188 lb (85.3 kg)   SpO2 98%   BMI 26.59 kg/m   Body mass index is 26.59 kg/m.  Advanced Directives 04/14/2018 04/12/2017 03/05/2015 02/19/2015  Does Patient Have a Medical Advance Directive? No No Yes No  Would patient like information on creating a medical advance directive? Yes (MAU/Ambulatory/Procedural Areas - Information given) Yes (MAU/Ambulatory/Procedural Areas - Information given) - -    Tobacco Social History   Tobacco Use  Smoking Status Never Smoker  Smokeless Tobacco Never Used     Counseling given: Not Answered   Clinical Intake: Pain : No/denies pain    Past Medical History:  Diagnosis Date  . Anemia   . GERD (gastroesophageal reflux disease) 12-12-04   ring like esophageal stricture with reflux esophagitis, also in 2011  . Hiatal hernia   . History of adenomatous polyp of colon    tubular adenom's in 2006;  2011; 03-05-2015  . History of esophageal stricture    s/p  dilatation 2006  and 2012  . Osteopenia   . Penile ulcer   . Plantar fasciitis   . Prostate nodule   . Pseudophakia of both eyes   . Wears hearing aid in  both ears    Past Surgical History:  Procedure Laterality Date  . CARDIOVASCULAR STRESS TEST  06/11/2011   normal nuclear study w/ no ischemia/  normal LV function and wall motion, ef 58%  . CATARACT EXTRACTION W/ INTRAOCULAR LENS  IMPLANT, BILATERAL  2008 approx.  . COLONOSCOPY  last one 03-05-2015  . ESOPHAGOGASTRODUODENOSCOPY  last one 01-06-2011  . INGUINAL HERNIA REPAIR Right 1990's  . PENILE BIOPSY N/A 03/12/2017   Procedure: EXCISIONAL GLANS PENILE BIOPSY;  Surgeon: Festus Aloe, MD;  Location: [facility];  Service: Urology;  Laterality: N/A;  . PROSTATE BIOPSY  11/2011   normal  . TONSILLECTOMY  child   Family History  Problem Relation Age of Onset  . Coronary artery disease Other   . Heart disease Other        rheumatic  . Colon cancer Neg Hx   . Esophageal cancer Neg Hx   . Rectal cancer Neg Hx   . Stomach cancer Neg Hx    Social History   Socioeconomic History  . Marital status: Married    Spouse name: Not on file  . Number of children: Not on file  . Years of education: Not on file  . Highest education level: Not on file  Occupational History  . Not on file  Social Needs  . Financial resource strain: Not on file  .  Food insecurity:    Worry: Not on file    Inability: Not on file  . Transportation needs:    Medical: Not on file    Non-medical: Not on file  Tobacco Use  . Smoking status: Never Smoker  . Smokeless tobacco: Never Used  Substance and Sexual Activity  . Alcohol use: No    Alcohol/week: 0.0 oz  . Drug use: No  . Sexual activity: Yes  Lifestyle  . Physical activity:    Days per week: Not on file    Minutes per session: Not on file  . Stress: Not on file  Relationships  . Social connections:    Talks on phone: Not on file    Gets together: Not on file    Attends religious service: Not on file    Active member of club or organization: Not on file    Attends meetings of clubs or organizations: Not on file     Relationship status: Not on file  Other Topics Concern  . Not on file  Social History Narrative   Retired   Married (2nd marriage)   Daily Caffeine- use up to 6/ daily          Outpatient Encounter Medications as of 04/14/2018  Medication Sig  . omeprazole (PRILOSEC) 40 MG capsule TAKE 1 CAPSULE BY MOUTH ONCE DAILY BEFORE BREAKFAST  . Calcium Carbonate-Vitamin D (CALCIUM 600/VITAMIN D) 600-400 MG-UNIT per tablet Take 1 tablet by mouth as needed.    No facility-administered encounter medications on file as of 04/14/2018.     Activities of Daily Living In your present state of health, do you have any difficulty performing the following activities: 04/14/2018  Hearing? Y  Comment wears hearing aids. Left is broke. Looking to get a new one.   Vision? N  Difficulty concentrating or making decisions? N  Walking or climbing stairs? N  Dressing or bathing? N  Doing errands, shopping? N  Preparing Food and eating ? N  Using the Toilet? N  In the past six months, have you accidently leaked urine? N  Do you have problems with loss of bowel control? N  Managing your Medications? N  Managing your Finances? N  Housekeeping or managing your Housekeeping? N  Some recent data might be hidden    Patient Care Team: [name], NP as PCP - General (Internal Medicine) Gatha Mayer, MD as Consulting Physician (Gastroenterology) Christy Sartorius, MD as Referring Physician (Urology)   Assessment:   This is a routine wellness examination for [name]. Physical assessment deferred to PCP.  Exercise Activities and Dietary recommendations Exercise limited by: None identified Diet (meal preparation, eat out, water intake, caffeinated beverages, dairy products, fruits and vegetables): well balanced, on average, 3 meals per day   Goals    . Begin painting artwork again.       Fall Risk Fall Risk  04/14/2018 04/20/2017 01/24/2016 02/01/2015 01/22/2015  Falls in the past year? Yes No No No No   Number falls in past yr: 1 - - - -  Injury with Fall? No - - - -  Follow up Education provided;Falls prevention discussed - - - -     Depression Screen PHQ 2/9 Scores 04/14/2018 04/20/2017 01/24/2016 02/01/2015  PHQ - 2 Score 0 1 0 0  PHQ- 9 Score - 6 - -    Cognitive Function MMSE - Mini Mental State Exam 04/14/2018  Orientation to time 5  Orientation to Place 5  Registration 3  Attention/ Calculation 5  Recall 1  Language- name 2 objects 2  Language- repeat 1  Language- follow 3 step command 3  Language- read & follow direction 1  Write a sentence 1  Copy design 1  Total score 28        Immunization History  Administered Date(s) Administered  . Influenza Whole 08/22/2007, 07/13/2009, 07/19/2010  . Influenza, High Dose Seasonal PF 07/23/2015, 08/11/2016  . Influenza-Unspecified 08/02/2014  . Pneumococcal Conjugate-13 01/22/2015  . Pneumococcal Polysaccharide-23 02/14/2007  . Td 05/19/2004, 01/22/2015  . Zoster 11/02/2009     Screening Tests Health Maintenance  Topic Date Due  . INFLUENZA VACCINE  06/02/2018  . TETANUS/TDAP  01/21/2025  . PNA vac Low Risk Adult  Completed       Plan:    Please schedule your next medicare wellness visit with me in 1 yr.  Follow up with PCP as directed  Continue to eat heart healthy diet (full of fruits, vegetables, whole grains, lean protein, water--limit salt, fat, and sugar intake) and increase physical activity as tolerated.  Continue doing brain stimulating activities (puzzles, reading, adult coloring books, staying active) to keep memory sharp.     I have personally reviewed and noted the following in the patient's chart:   . Medical and social history . Use of alcohol, tobacco or illicit drugs  . Current medications and supplements . Functional ability and status . Nutritional status . Physical activity . Advanced directives . List of other physicians . Hospitalizations, surgeries, and ER visits in previous 12  months . Vitals . Screenings to include cognitive, depression, and falls . Referrals and appointments  In addition, I have reviewed and discussed with patient certain preventive protocols, quality metrics, and best practice recommendations. A written personalized care plan for preventive services as well as general preventive health recommendations were provided to patient.     Shela Nevin, [place]  04/14/2018

## 2018-04-14 ENCOUNTER — Encounter: Payer: Self-pay | Admitting: *Deleted

## 2018-04-14 ENCOUNTER — Ambulatory Visit (INDEPENDENT_AMBULATORY_CARE_PROVIDER_SITE_OTHER): Payer: Medicare Other | Admitting: *Deleted

## 2018-04-14 VITALS — BP 128/70 | HR 65 | Ht 70.5 in | Wt 188.0 lb

## 2018-04-14 DIAGNOSIS — Z0000 Encounter for general adult medical examination without abnormal findings: Secondary | ICD-10-CM | POA: Diagnosis not present

## 2018-04-14 NOTE — Progress Notes (Signed)
Reviewed  Yvonne R Lowne Chase, DO  

## 2018-04-14 NOTE — Patient Instructions (Signed)
Great to see you again!    Please schedule your next medicare wellness visit with me in 1 yr.  Follow up with PCP as directed  Continue to eat heart healthy diet (full of fruits, vegetables, whole grains, lean protein, water--limit salt, fat, and sugar intake) and increase physical activity as tolerated.  Continue doing brain stimulating activities (puzzles, reading, adult coloring books, staying active) to keep memory sharp.    Miguel Thomas , Thank you for taking time to come for your Medicare Wellness Visit. I appreciate your ongoing commitment to your health goals. Please review the following plan we discussed and let me know if I can assist you in the future.   These are the goals we discussed: Goals    . Begin painting artwork again.       This is a list of the screening recommended for you and due dates:  Health Maintenance  Topic Date Due  . Flu Shot  06/02/2018  . Tetanus Vaccine  01/21/2025  . Pneumonia vaccines  Completed    Health Maintenance, Male A healthy lifestyle and preventive care is important for your health and wellness. Ask your health care provider about what schedule of regular examinations is right for you. What should I know about weight and diet? Eat a Healthy Diet  Eat plenty of vegetables, fruits, whole grains, low-fat dairy products, and lean protein.  Do not eat a lot of foods high in solid fats, added sugars, or salt.  Maintain a Healthy Weight Regular exercise can help you achieve or maintain a healthy weight. You should:  Do at least 79 minutes of exercise each week. The exercise should increase your heart rate and make you sweat (moderate-intensity exercise).  Do strength-training exercises at least twice a week.  Watch Your Levels of Cholesterol and Blood Lipids  Have your blood tested for lipids and cholesterol every 5 years starting at 79 years of age. If you are at high risk for heart disease, you should start having your blood tested  when you are 79 years old. You may need to have your cholesterol levels checked more often if: ? Your lipid or cholesterol levels are high. ? You are older than 79 years of age. ? You are at high risk for heart disease.  What should I know about cancer screening? Many types of cancers can be detected early and may often be prevented. Lung Cancer  You should be screened every year for lung cancer if: ? You are a current smoker who has smoked for at least 30 years. ? You are a former smoker who has quit within the past 15 years.  Talk to your health care provider about your screening options, when you should start screening, and how often you should be screened.  Colorectal Cancer  Routine colorectal cancer screening usually begins at 79 years of age and should be repeated every 5-10 years until you are 79 years old. You may need to be screened more often if early forms of precancerous polyps or small growths are found. Your health care provider may recommend screening at an earlier age if you have risk factors for colon cancer.  Your health care provider may recommend using home test kits to check for hidden blood in the stool.  A small camera at the end of a tube can be used to examine your colon (sigmoidoscopy or colonoscopy). This checks for the earliest forms of colorectal cancer.  Prostate and Testicular Cancer  Depending on your  age and overall health, your health care provider may do certain tests to screen for prostate and testicular cancer.  Talk to your health care provider about any symptoms or concerns you have about testicular or prostate cancer.  Skin Cancer  Check your skin from head to toe regularly.  Tell your health care provider about any new moles or changes in moles, especially if: ? There is a change in a mole's size, shape, or color. ? You have a mole that is larger than a pencil eraser.  Always use sunscreen. Apply sunscreen liberally and repeat throughout  the day.  Protect yourself by wearing long sleeves, pants, a wide-brimmed hat, and sunglasses when outside.  What should I know about heart disease, diabetes, and high blood pressure?  If you are 59-7 years of age, have your blood pressure checked every 3-5 years. If you are 79 years of age or older, have your blood pressure checked every year. You should have your blood pressure measured twice-once when you are at a hospital or clinic, and once when you are not at a hospital or clinic. Record the average of the two measurements. To check your blood pressure when you are not at a hospital or clinic, you can use: ? An automated blood pressure machine at a pharmacy. ? A home blood pressure monitor.  Talk to your health care provider about your target blood pressure.  If you are between 79-50 years old, ask your health care provider if you should take aspirin to prevent heart disease.  Have regular diabetes screenings by checking your fasting blood sugar level. ? If you are at a normal weight and have a low risk for diabetes, have this test once every three years after the age of 48. ? If you are overweight and have a high risk for diabetes, consider being tested at a younger age or more often.  A one-time screening for abdominal aortic aneurysm (AAA) by ultrasound is recommended for men aged 79-75 years who are current or former smokers. What should I know about preventing infection? Hepatitis B If you have a higher risk for hepatitis B, you should be screened for this virus. Talk with your health care provider to find out if you are at risk for hepatitis B infection. Hepatitis C Blood testing is recommended for:  Everyone born from [birth year] through 1967.  Anyone with known risk factors for hepatitis C.  Sexually Transmitted Diseases (STDs)  You should be screened each year for STDs including gonorrhea and chlamydia if: ? You are sexually active and are younger than 79 years of age. ? You  are older than 79 years of age and your health care provider tells you that you are at risk for this type of infection. ? Your sexual activity has changed since you were last screened and you are at an increased risk for chlamydia or gonorrhea. Ask your health care provider if you are at risk.  Talk with your health care provider about whether you are at high risk of being infected with HIV. Your health care provider may recommend a prescription medicine to help prevent HIV infection.  What else can I do?  Schedule regular health, dental, and eye exams.  Stay current with your vaccines (immunizations).  Do not use any tobacco products, such as cigarettes, chewing tobacco, and e-cigarettes. If you need help quitting, ask your health care provider.  Limit alcohol intake to no more than 2 drinks per day. One drink equals 12 ounces  of beer, 5 ounces of wine, or 1 ounces of hard liquor.  Do not use street drugs.  Do not share needles.  Ask your health care provider for help if you need support or information about quitting drugs.  Tell your health care provider if you often feel depressed.  Tell your health care provider if you have ever been abused or do not feel safe at home. This information is not intended to replace advice given to you by your health care provider. Make sure you discuss any questions you have with your health care provider. Document Released: 04/16/2008 Document Revised: 06/17/2016 Document Reviewed: 07/23/2015 Elsevier Interactive Patient Education  [employer].

## 2018-06-22 DIAGNOSIS — L570 Actinic keratosis: Secondary | ICD-10-CM | POA: Diagnosis not present

## 2018-06-23 ENCOUNTER — Other Ambulatory Visit: Payer: Self-pay | Admitting: Internal Medicine

## 2018-08-19 DIAGNOSIS — Z23 Encounter for immunization: Secondary | ICD-10-CM | POA: Diagnosis not present

## 2018-10-27 ENCOUNTER — Telehealth: Payer: Self-pay | Admitting: Internal Medicine

## 2018-10-27 MED ORDER — OMEPRAZOLE 40 MG PO CPDR: 40.0000 mg | DELAYED_RELEASE_CAPSULE | Freq: Every day | ORAL | 0 refills | Status: DC

## 2018-10-27 NOTE — Telephone Encounter (Signed)
Pt called and stated that the prescription is needing to be called in he only has two left

## 2018-10-27 NOTE — Telephone Encounter (Signed)
Omeprazole refilled as requested and I left him a detailed message to call back for an appointment. He had to cancel his last one due to a ride issue.

## 2018-11-22 NOTE — Telephone Encounter (Signed)
Pt called back in and stated that a msg was left to call back to sched an appt,. Pt stated he did not need an appt. I did advised that the nurse is requesting the appt for the med refill he did state that he will call back end of month to sched for March because he is pretty much booked for feb.

## 2018-12-15 DIAGNOSIS — L819 Disorder of pigmentation, unspecified: Secondary | ICD-10-CM | POA: Diagnosis not present

## 2018-12-15 DIAGNOSIS — L570 Actinic keratosis: Secondary | ICD-10-CM | POA: Diagnosis not present

## 2019-01-11 DIAGNOSIS — H43811 Vitreous degeneration, right eye: Secondary | ICD-10-CM | POA: Diagnosis not present

## 2019-01-11 DIAGNOSIS — Z961 Presence of intraocular lens: Secondary | ICD-10-CM | POA: Diagnosis not present

## 2019-02-15 ENCOUNTER — Other Ambulatory Visit: Payer: Self-pay | Admitting: Internal Medicine

## 2019-03-20 DIAGNOSIS — N402 Nodular prostate without lower urinary tract symptoms: Secondary | ICD-10-CM | POA: Diagnosis not present

## 2019-04-17 ENCOUNTER — Ambulatory Visit: Payer: Self-pay | Admitting: *Deleted

## 2019-05-19 DIAGNOSIS — Z0000 Encounter for general adult medical examination without abnormal findings: Secondary | ICD-10-CM | POA: Diagnosis not present

## 2019-06-01 ENCOUNTER — Other Ambulatory Visit: Payer: Self-pay

## 2019-07-20 ENCOUNTER — Other Ambulatory Visit: Payer: Self-pay | Admitting: Internal Medicine

## 2019-08-14 DIAGNOSIS — Z23 Encounter for immunization: Secondary | ICD-10-CM | POA: Diagnosis not present

## 2019-09-11 DIAGNOSIS — C44722 Squamous cell carcinoma of skin of right lower limb, including hip: Secondary | ICD-10-CM | POA: Diagnosis not present

## 2019-09-11 DIAGNOSIS — L280 Lichen simplex chronicus: Secondary | ICD-10-CM | POA: Diagnosis not present

## 2019-09-11 DIAGNOSIS — L821 Other seborrheic keratosis: Secondary | ICD-10-CM | POA: Diagnosis not present

## 2019-09-11 DIAGNOSIS — L570 Actinic keratosis: Secondary | ICD-10-CM | POA: Diagnosis not present

## 2019-09-11 DIAGNOSIS — D485 Neoplasm of uncertain behavior of skin: Secondary | ICD-10-CM | POA: Diagnosis not present

## 2019-09-11 DIAGNOSIS — D1801 Hemangioma of skin and subcutaneous tissue: Secondary | ICD-10-CM | POA: Diagnosis not present

## 2019-10-03 DIAGNOSIS — C801 Malignant (primary) neoplasm, unspecified: Secondary | ICD-10-CM

## 2019-10-03 HISTORY — DX: Malignant (primary) neoplasm, unspecified: C80.1

## 2019-10-13 ENCOUNTER — Ambulatory Visit (HOSPITAL_BASED_OUTPATIENT_CLINIC_OR_DEPARTMENT_OTHER)
Admission: RE | Admit: 2019-10-13 | Discharge: 2019-10-13 | Disposition: A | Discharge status: Home / Self Care | Payer: Medicare Other | Source: Ambulatory Visit | Attending: Internal Medicine | Admitting: Internal Medicine

## 2019-10-13 ENCOUNTER — Other Ambulatory Visit: Payer: Self-pay

## 2019-10-13 ENCOUNTER — Ambulatory Visit (INDEPENDENT_AMBULATORY_CARE_PROVIDER_SITE_OTHER): Payer: Medicare Other | Admitting: Internal Medicine

## 2019-10-13 ENCOUNTER — Other Ambulatory Visit (HOSPITAL_BASED_OUTPATIENT_CLINIC_OR_DEPARTMENT_OTHER): Payer: Self-pay | Admitting: Family

## 2019-10-13 ENCOUNTER — Encounter: Payer: Self-pay | Admitting: Internal Medicine

## 2019-10-13 VITALS — BP 137/67 | HR 77 | Temp 98.4°F | Resp 18 | Ht 70.5 in | Wt 178.5 lb

## 2019-10-13 DIAGNOSIS — M7989 Other specified soft tissue disorders: Secondary | ICD-10-CM

## 2019-10-13 DIAGNOSIS — M109 Gout, unspecified: Secondary | ICD-10-CM

## 2019-10-13 DIAGNOSIS — M25571 Pain in right ankle and joints of right foot: Secondary | ICD-10-CM | POA: Insufficient documentation

## 2019-10-13 DIAGNOSIS — R52 Pain, unspecified: Secondary | ICD-10-CM

## 2019-10-13 LAB — CBC WITH DIFFERENTIAL/PLATELET
Basophils Absolute: 0 10*3/uL (ref 0.0–0.1)
Basophils Relative: 0.6 % (ref 0.0–3.0)
Eosinophils Absolute: 0.1 10*3/uL (ref 0.0–0.7)
Eosinophils Relative: 1.6 % (ref 0.0–5.0)
HCT: 38.2 % — ABNORMAL LOW (ref 39.0–52.0)
Hemoglobin: 13 g/dL (ref 13.0–17.0)
Lymphocytes Relative: 13.8 % (ref 12.0–46.0)
Lymphs Abs: 0.9 10*3/uL (ref 0.7–4.0)
MCHC: 34.1 g/dL (ref 30.0–36.0)
MCV: 96.8 fl (ref 78.0–100.0)
Monocytes Absolute: 0.6 10*3/uL (ref 0.1–1.0)
Monocytes Relative: 8.3 % (ref 3.0–12.0)
Neutro Abs: 5.1 10*3/uL (ref 1.4–7.7)
Neutrophils Relative %: 75.7 % (ref 43.0–77.0)
Platelets: 228 10*3/uL (ref 150.0–400.0)
RBC: 3.94 Mil/uL — ABNORMAL LOW (ref 4.22–5.81)
RDW: 14.8 % (ref 11.5–15.5)
WBC: 6.7 10*3/uL (ref 4.0–10.5)

## 2019-10-13 LAB — COMPREHENSIVE METABOLIC PANEL
ALT: 17 U/L (ref 0–53)
AST: 21 U/L (ref 0–37)
Albumin: 4.1 g/dL (ref 3.5–5.2)
Alkaline Phosphatase: 84 U/L (ref 39–117)
BUN: 17 mg/dL (ref 6–23)
CO2: 26 mEq/L (ref 19–32)
Calcium: 8.9 mg/dL (ref 8.4–10.5)
Chloride: 104 mEq/L (ref 96–112)
Creatinine, Ser: 0.94 mg/dL (ref 0.40–1.50)
GFR: 77.06 mL/min (ref >60.00)
Glucose, Bld: 100 mg/dL — ABNORMAL HIGH (ref 70–99)
Potassium: 4.2 mEq/L (ref 3.5–5.1)
Sodium: 140 mEq/L (ref 135–145)
Total Bilirubin: 1.1 mg/dL (ref 0.2–1.2)
Total Protein: 7.2 g/dL (ref 6.0–8.3)

## 2019-10-13 LAB — URIC ACID: Uric Acid, Serum: 5.3 mg/dL (ref 4.0–7.8)

## 2019-10-13 MED ORDER — CEPHALEXIN 500 MG PO CAPS: 500.0000 mg | ORAL_CAPSULE | Freq: Four times a day (QID) | ORAL | 0 refills | Status: DC

## 2019-10-13 MED ORDER — PREDNISONE 10 MG PO TABS: ORAL_TABLET | ORAL | 0 refills | Status: DC

## 2019-10-13 NOTE — Progress Notes (Signed)
Pre visit review using our clinic review tool, if applicable. No additional management support is needed unless otherwise documented below in the visit note. 

## 2019-10-13 NOTE — Progress Notes (Signed)
Please call the patient:Ultrasound negative for DVT, x-rays and blood work essentially normal.Still suspect gout and possibly a infection. Recommend to take prednisone as prescribed, Keflex.  I already sent the prescriptions ER if worse this weekend, see PCP next week, please be sure he has an appointment

## 2019-10-13 NOTE — Progress Notes (Signed)
Subjective:    Patient ID: Miguel Thomas, male    DOB: 20-Mar-1941, 78 y.o.   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 4 days later, he noted a bruise at the posterior aspect of the right leg.  About a week ago he developed severe pain around the distal Achilles tendon and ankle. Denies any injury, other than what is described above. No fever chills No calf pain No history of gout before.   Review of Systems As above  Past Medical History:  Diagnosis Date  . Anemia   . GERD (gastroesophageal reflux disease) 12-12-04   ring like esophageal stricture with reflux esophagitis, also in 2011  . Hiatal hernia   . History of adenomatous polyp of colon    tubular adenom's in 2006;  2011; 03-05-2015  . History of esophageal stricture    s/p  dilatation 2006  and 2012  . Osteopenia   . Penile ulcer   . Plantar fasciitis   . Prostate nodule   . Pseudophakia of both eyes   . Wears hearing aid in both ears     Past Surgical History:  Procedure Laterality Date  . CARDIOVASCULAR STRESS TEST  06/11/2011   normal nuclear study w/ no ischemia/  normal LV function and wall motion, ef 58%  . CATARACT EXTRACTION W/ INTRAOCULAR LENS  IMPLANT, BILATERAL  2008 approx.  . COLONOSCOPY  last one 03-05-2015  . ESOPHAGOGASTRODUODENOSCOPY  last one 01-06-2011  . INGUINAL HERNIA REPAIR Right 1990's  . PENILE BIOPSY N/A 03/12/2017   Procedure: EXCISIONAL GLANS PENILE BIOPSY;  Surgeon: Festus Aloe, MD;  Location: [facility];  Service: Urology;  Laterality: N/A;  . PROSTATE BIOPSY  11/2011   normal  . TONSILLECTOMY  child    Social History   Socioeconomic History  . Marital status: Married    Spouse name: Not  on file  . Number of children: Not on file  . Years of education: Not on file  . Highest education level: Not on file  Occupational History  . Not on file  Tobacco Use  . Smoking status: Never Smoker  . Smokeless tobacco: Never Used  Substance and Sexual Activity  . Alcohol use: No    Alcohol/week: 0.0 standard drinks  . Drug use: No  . Sexual activity: Yes  Other Topics Concern  . Not on file  Social History Narrative   Retired   Married (2nd marriage)   Daily Caffeine- use up to [employer] daily         Social Determinants of Radio broadcast assistant Strain:   . Difficulty of Paying Living Expenses: Not on file  Food Insecurity:   . Worried About Charity fundraiser in the Last Year: Not on file  . Ran Out of Food in the Last Year: Not on file  Transportation Needs:   . Lack of Transportation (Medical): Not on file  . Lack of Transportation (Non-Medical): Not on file  Physical Activity:   . Days of Exercise per Week: Not on file  . Minutes of Exercise per Session: Not on file  Stress:   . Feeling of Stress : Not on file  Social Connections:   .  Frequency of Communication with Friends and Family: Not on file  . Frequency of Social Gatherings with Friends and Family: Not on file  . Attends Religious Services: Not on file  . Active Member of Clubs or Organizations: Not on file  . Attends Archivist Meetings: Not on file  . Marital Status: Not on file  Intimate Partner Violence:   . Fear of Current or Ex-Partner: Not on file  . Emotionally Abused: Not on file  . Physically Abused: Not on file  . Sexually Abused: Not on file      Allergies as of 10/13/2019   No Known Allergies     Medication List       Accurate as of October 13, 2019 11:59 PM. If you have any questions, ask your nurse or doctor.        Calcium 600/Vitamin D 600-400 MG-UNIT tablet Generic drug: Calcium Carbonate-Vitamin D Take 1 tablet by mouth as needed.   cephALEXin 500 MG  capsule Commonly known as: KEFLEX Take 1 capsule (500 mg total) by mouth 4 (four) times daily. Started by: Kathlene November, MD   omeprazole 40 MG capsule Commonly known as: PRILOSEC TAKE 1 CAPSULE BY MOUTH ONCE DAILY BEFORE BREAKFAST   predniSONE 10 MG tablet Commonly known as: DELTASONE 3 tabs x 2 days, 2 tabs x 2 days, 1 tab x 2 days Started by: Kathlene November, MD           Objective:   Physical Exam BP 137/67 (BP Location: Left Arm, Patient Position: Sitting, Cuff Size: Small)   Pulse 77   Temp 98.4 F (36.9 C) (Temporal)   Resp 18   Ht 5' 10.5" (1.791 m)   Wt 178 lb 8 oz (81 kg)   SpO2 98%   BMI 25.25 kg/m  General:   Well developed, NAD, BMI noted. HEENT:  Normocephalic . Face symmetric, atraumatic Lower extremities: Varicose veins without phlebitis noted R>L leg He has ecchymosis on the posterior aspect of the right leg and also at the distal right foot. Calves are measured: Right calf is half inch larger than the left. Right ankle: Slightly warm, quite TTP particularly at the Achilles tendon level which seems to be intact, minimal redness. Right foot: Has ecchymosis distally and is swollen but is not TTP.  Very good pedal pulses. Left ankle and foot: No major swelling, redness or warmness. Neurologic:  alert & oriented X3.  Speech normal, gait admitted by pain, unassisted Psych--  Cognition and judgment appear intact.  Cooperative with normal attention span and concentration.  Behavior appropriate. No anxious or depressed appearing.          Assessment    78 year old gentleman, PMH includes osteopenia, GERD, presents with  Right leg swelling, ankle pain. I suspect the patient had a hamstring injury few days ago hence the bruising and swelling of the right leg. He also has warm and tender right ankle, probably not related to above, rather a gout episode versus occult fracture.  Doubt cellulitis Plan: Right leg ultrasound rule out DVT, R foot and ankle x-ray  rule out fracture, CMP, CBC, uric acid. Keep leg elevated, ice, Tylenol Further advised with results. Addendum: Ultrasound no DVT, x-rays no acute findings, CMP, CBC uric acid essentially normal.  No elevated white count. Most likely, ankle pain is due to gout despite a normal uric acid, less likely cellulitis and much unlikely a joint infection. Plan: Prednisone for few days, cover with Keflex, reassess next week  This visit occurred during the SARS-CoV-2 public health emergency.  Safety protocols were in place, including screening questions prior to the visit, additional usage of staff PPE, and extensive cleaning of exam room while observing appropriate contact time as indicated for disinfecting solutions.

## 2019-10-13 NOTE — Patient Instructions (Signed)
GO TO THE LAB : Get the blood work     GO TO THE FRONT DESK Schedule your next appointment for a checkup with your primary provider next week   STOP BY THE FIRST FLOOR:  get the XR and schedule the ultrasound  Ice  the ankle and keep the right leg elevated  We will call you with the results later on today

## 2019-10-16 ENCOUNTER — Other Ambulatory Visit: Payer: Self-pay

## 2019-10-17 ENCOUNTER — Other Ambulatory Visit: Payer: Self-pay | Admitting: General Surgery

## 2019-10-17 ENCOUNTER — Other Ambulatory Visit: Payer: Self-pay

## 2019-10-17 ENCOUNTER — Encounter: Payer: Self-pay | Admitting: Family

## 2019-10-17 ENCOUNTER — Ambulatory Visit (INDEPENDENT_AMBULATORY_CARE_PROVIDER_SITE_OTHER): Payer: Medicare Other | Admitting: Family

## 2019-10-17 VITALS — BP 131/74 | HR 67 | Temp 97.7°F | Resp 16 | Ht 70.0 in | Wt 181.0 lb

## 2019-10-17 DIAGNOSIS — M79671 Pain in right foot: Secondary | ICD-10-CM | POA: Diagnosis not present

## 2019-10-17 NOTE — Patient Instructions (Signed)
Please complete keflex and prednisone. Call if pain worsens or if it doesn't continue to improve.

## 2019-10-17 NOTE — Progress Notes (Signed)
Subjective:    Patient ID: Miguel Thomas, male    DOB: 1940/12/06, 78 y.o.   MRN: [id number]  HPI  Patient is an 78 yr old male who presents today for follow up of his right foot swelling. He saw Dr. Larose Kells on 12/11 with report of severe pain around the distal achilles tendon and ankle. He had also suffered a hamstring injury a few days before. He underwent an [place] which was negative for DVT. X-rays were negative for acute findings, CMP, CBC, uric acid unremarkable.  He was treated empirically for gout with prednisone and for infection with keflex.   He reports that the pain is nearly resolved, he is able to ambulate on right foot without difficulty. Feels that the swelling is improving as well.  In addition he feels that the bruising is improved on his leg.      Review of Systems See  HPI  Past Medical History:  Diagnosis Date  . Anemia   . GERD (gastroesophageal reflux disease) 12-12-04   ring like esophageal stricture with reflux esophagitis, also in 2011  . Hiatal hernia   . History of adenomatous polyp of colon    tubular adenom's in 2006;  2011; 03-05-2015  . History of esophageal stricture    s/p  dilatation 2006  and 2012  . Osteopenia   . Penile ulcer   . Plantar fasciitis   . Prostate nodule   . Pseudophakia of both eyes   . Wears hearing aid in both ears      Social History   Socioeconomic History  . Marital status: Married    Spouse name: Not on file  . Number of children: Not on file  . Years of education: Not on file  . Highest education level: Not on file  Occupational History  . Not on file  Tobacco Use  . Smoking status: Never Smoker  . Smokeless tobacco: Never Used  Substance and Sexual Activity  . Alcohol use: No    Alcohol/week: 0.0 standard drinks  . Drug use: No  . Sexual activity: Yes  Other Topics Concern  . Not on file  Social History Narrative   Retired   Married (2nd marriage)   Daily Caffeine- use up to [employer] daily         Social  Determinants of Radio broadcast assistant Strain:   . Difficulty of Paying Living Expenses: Not on file  Food Insecurity:   . Worried About Charity fundraiser in the Last Year: Not on file  . Ran Out of Food in the Last Year: Not on file  Transportation Needs:   . Lack of Transportation (Medical): Not on file  . Lack of Transportation (Non-Medical): Not on file  Physical Activity:   . Days of Exercise per Week: Not on file  . Minutes of Exercise per Session: Not on file  Stress:   . Feeling of Stress : Not on file  Social Connections:   . Frequency of Communication with Friends and Family: Not on file  . Frequency of Social Gatherings with Friends and Family: Not on file  . Attends Religious Services: Not on file  . Active Member of Clubs or Organizations: Not on file  . Attends Archivist Meetings: Not on file  . Marital Status: Not on file  Intimate Partner Violence:   . Fear of Current or Ex-Partner: Not on file  . Emotionally Abused: Not on file  . Physically Abused: Not  on file  . Sexually Abused: Not on file    Past Surgical History:  Procedure Laterality Date  . CARDIOVASCULAR STRESS TEST  06/11/2011   normal nuclear study w/ no ischemia/  normal LV function and wall motion, ef 58%  . CATARACT EXTRACTION W/ INTRAOCULAR LENS  IMPLANT, BILATERAL  2008 approx.  . COLONOSCOPY  last one 03-05-2015  . ESOPHAGOGASTRODUODENOSCOPY  last one 01-06-2011  . INGUINAL HERNIA REPAIR Right 1990's  . PENILE BIOPSY N/A 03/12/2017   Procedure: EXCISIONAL GLANS PENILE BIOPSY;  Surgeon: Festus Aloe, MD;  Location[facility];  Service: Urology;  Laterality: N/A;  . PROSTATE BIOPSY  11/2011   normal  . TONSILLECTOMY  child    Family History  Problem Relation Age of Onset  . Coronary artery disease Other   . Heart disease Other        rheumatic  . Colon cancer Neg Hx   . Esophageal cancer Neg Hx   . Rectal cancer Neg Hx   . Stomach cancer Neg Hx       No Known Allergies  Current Outpatient Medications on File Prior to Visit  Medication Sig Dispense Refill  . Calcium Carbonate-Vitamin D (CALCIUM 600/VITAMIN D) 600-400 MG-UNIT per tablet Take 1 tablet by mouth as needed.     . cephALEXin (KEFLEX) 500 MG capsule Take 1 capsule (500 mg total) by mouth 4 (four) times daily. 28 capsule 0  . omeprazole (PRILOSEC) 40 MG capsule TAKE 1 CAPSULE BY MOUTH ONCE DAILY BEFORE BREAKFAST 90 capsule 0  . predniSONE (DELTASONE) 10 MG tablet 3 tabs x 2 days, 2 tabs x 2 days, 1 tab x 2 days 12 tablet 0   No current facility-administered medications on file prior to visit.    BP 131/74 (BP Location: Right Arm, Patient Position: Sitting, Cuff Size: Small)   Pulse 67   Temp 97.7 F (36.5 C) (Temporal)   Resp 16   Ht 5\' 10"  (1.778 m)   Wt 181 lb (82.1 kg)   SpO2 99%   BMI 25.97 kg/m       Objective:   Physical Exam Constitutional:      Appearance: Normal appearance.  Pulmonary:     Effort: Pulmonary effort is normal.  Musculoskeletal:     Comments: + swelling noted of the right foot  Full ROM of right foot/ankle.  2-3+ RLE swelling is noted  Skin:    Comments: + ecchymosis noted right upper anterior thigh- significantly improved from photo last visit.   Neurological:     Mental Status: He is alert.           Assessment & Plan:  R foot pain- ? Gout, improving with empiric prednisone and keflex. I advised the patient to continue current medications and to let me know if his symptoms do not continue to improve. Pt verbalizes  Understanding.   This visit occurred during the SARS-CoV-2 public health emergency.  Safety protocols were in place, including screening questions prior to the visit, additional usage of staff PPE, and extensive cleaning of exam room while observing appropriate contact time as indicated for disinfecting solutions.

## 2019-11-07 ENCOUNTER — Ambulatory Visit (INDEPENDENT_AMBULATORY_CARE_PROVIDER_SITE_OTHER): Payer: Medicare Other | Admitting: Family

## 2019-11-07 ENCOUNTER — Other Ambulatory Visit: Payer: Self-pay

## 2019-11-07 ENCOUNTER — Encounter: Payer: Self-pay | Admitting: Family

## 2019-11-07 VITALS — HR 83 | Temp 99.0°F

## 2019-11-07 DIAGNOSIS — U071 COVID-19: Secondary | ICD-10-CM | POA: Diagnosis not present

## 2019-11-07 NOTE — Progress Notes (Signed)
Virtual Visit via Telephone Note  I connected with Miguel Thomas on 11/07/19 at 10:20 AM EST by telephone and verified that I am speaking with the correct person using two identifiers.  Location: Patient: home Provider: work   I discussed the limitations, risks, security and privacy concerns of performing an evaluation and management service by telephone and the availability of in person appointments. I also discussed with the patient that there may be a patient responsible charge related to this service. The patient expressed understanding and agreed to proceed.   History of Present Illness:  Patient is an 79 year old male who presents today to discuss recent positive COVID-19 test.  Patient is a poor historian.  Reports that [facility] to take covid test and tested positive.  He reports that he was not having any symptoms concerning for COVID-19 but just wanted to be screened.  At this time he reports only symptom of mild cough.   Denies associated HA, sore throat, loss of taste or loss of smell.  Reports temp this AM was 99. Reports that he is using pulse ox which is 95 %. Has tussin DM that he picked up from the pharmacy. He denies current SOB.    A friend brought him the following vitamins which he is taking.   Taking 1000mg  vit C bid 5000 iu vit d  50mg  zinc once daily 500mg  b12 daily 500mg  tumeric daily  Of note his wife is also positive for COVID-19 and is currently hospitalized and has been moved to comfort care only.   Past Medical History:  Diagnosis Date  . Anemia   . GERD (gastroesophageal reflux disease) 12-12-04   ring like esophageal stricture with reflux esophagitis, also in 2011  . Hiatal hernia   . History of adenomatous polyp of colon    tubular adenom's in 2006;  2011; 03-05-2015  . History of esophageal stricture    s/p  dilatation 2006  and 2012  . Osteopenia   . Penile ulcer   . Plantar fasciitis   . Prostate nodule   . Pseudophakia of  both eyes   . Wears hearing aid in both ears      Social History   Socioeconomic History  . Marital status: Married    Spouse name: Not on file  . Number of children: Not on file  . Years of education: Not on file  . Highest education level: Not on file  Occupational History  . Not on file  Tobacco Use  . Smoking status: Never Smoker  . Smokeless tobacco: Never Used  Substance and Sexual Activity  . Alcohol use: No    Alcohol/week: 0.0 standard drinks  . Drug use: No  . Sexual activity: Yes  Other Topics Concern  . Not on file  Social History Narrative   Retired   Married (2nd marriage)   Daily Caffeine- use up to [employer] daily         Social Determinants of Radio broadcast assistant Strain:   . Difficulty of Paying Living Expenses: Not on file  Food Insecurity:   . Worried About Charity fundraiser in the Last Year: Not on file  . Ran Out of Food in the Last Year: Not on file  Transportation Needs:   . Lack of Transportation (Medical): Not on file  . Lack of Transportation (Non-Medical): Not on file  Physical Activity:   . Days of Exercise per Week: Not on file  .  Minutes of Exercise per Session: Not on file  Stress:   . Feeling of Stress : Not on file  Social Connections:   . Frequency of Communication with Friends and Family: Not on file  . Frequency of Social Gatherings with Friends and Family: Not on file  . Attends Religious Services: Not on file  . Active Member of Clubs or Organizations: Not on file  . Attends Archivist Meetings: Not on file  . Marital Status: Not on file  Intimate Partner Violence:   . Fear of Current or Ex-Partner: Not on file  . Emotionally Abused: Not on file  . Physically Abused: Not on file  . Sexually Abused: Not on file    Past Surgical History:  Procedure Laterality Date  . CARDIOVASCULAR STRESS TEST  06/11/2011   normal nuclear study w/ no ischemia/  normal LV function and wall motion, ef 58%  . CATARACT  EXTRACTION W/ INTRAOCULAR LENS  IMPLANT, BILATERAL  2008 approx.  . COLONOSCOPY  last one 03-05-2015  . ESOPHAGOGASTRODUODENOSCOPY  last one 01-06-2011  . INGUINAL HERNIA REPAIR Right 1990's  . PENILE BIOPSY N/A 03/12/2017   Procedure: EXCISIONAL GLANS PENILE BIOPSY;  Surgeon: Festus Aloe, MD;  Location: [facility];  Service: Urology;  Laterality: N/A;  . PROSTATE BIOPSY  11/2011   normal  . TONSILLECTOMY  child    Family History  Problem Relation Age of Onset  . Coronary artery disease Other   . Heart disease Other        rheumatic  . Colon cancer Neg Hx   . Esophageal cancer Neg Hx   . Rectal cancer Neg Hx   . Stomach cancer Neg Hx     No Known Allergies  Current Outpatient Medications on File Prior to Visit  Medication Sig Dispense Refill  . omeprazole (PRILOSEC) 40 MG capsule TAKE 1 CAPSULE BY MOUTH ONCE DAILY BEFORE BREAKFAST 90 capsule 0   No current facility-administered medications on file prior to visit.    Pulse 83   Temp 99 F (37.2 C) (Oral)   SpO2 94%  Observations/Objective:   Gen: Awake, alert Resp: Breathing sounds even and non-labored Psych: calm/pleasant demeanor Neuro: speech sounds is clear.    Assessment and Plan:  COVID-19 infection-he is currently stable clinically.  I advised him on supportive measures including Tylenol as needed, rest, hydration, and [place] DM as needed.  We discussed that should he develop high fever greater than 101, shortness of breath, or oxygen saturation 92 or less he should proceed to the nearest emergency room.  Patient verbalizes understanding.  Follow Up Instructions:    I discussed the assessment and treatment plan with the patient. The patient was provided an opportunity to ask questions and all were answered. The patient agreed with the plan and demonstrated an understanding of the instructions.   The patient was advised to call back or seek an in-person evaluation if the symptoms worsen  or if the condition fails to improve as anticipated.  I provided 11 minutes of non-face-to-face time during this encounter.   Nance Pear, NP

## 2019-11-13 ENCOUNTER — Telehealth: Payer: Self-pay | Admitting: Family

## 2019-11-13 NOTE — Telephone Encounter (Signed)
Please let him know I am sorry to hear about his wife.    He does not need to be retested.  He should be able to lift his quarantine since it has been >10 days as long as he is feeling well without fever.  He should continue to mask and social distance.

## 2019-11-13 NOTE — Telephone Encounter (Signed)
Pt stated he tested positive for Covid 19 on [facility]. Pt would like to know if Lenna Sciara thinks he needs to retest after his 14 day quarantine or not. Pt's wife recently passed away and he needs to plan funeral. Please advise.

## 2019-11-14 NOTE — Telephone Encounter (Signed)
Patient advised of provider's comments and advise, he reports he has no fever. Just some occasional cough.  Advised to continue to mask and keep social distance.

## 2019-11-21 ENCOUNTER — Other Ambulatory Visit: Payer: Self-pay | Admitting: General Surgery

## 2019-11-21 ENCOUNTER — Other Ambulatory Visit (HOSPITAL_COMMUNITY): Payer: Self-pay | Admitting: General Surgery

## 2019-11-21 DIAGNOSIS — C434 Malignant melanoma of scalp and neck: Secondary | ICD-10-CM

## 2019-11-23 ENCOUNTER — Other Ambulatory Visit (HOSPITAL_COMMUNITY): Payer: Self-pay | Admitting: General Surgery

## 2019-11-23 ENCOUNTER — Other Ambulatory Visit: Payer: Self-pay | Admitting: General Surgery

## 2019-11-23 DIAGNOSIS — C434 Malignant melanoma of scalp and neck: Secondary | ICD-10-CM

## 2019-11-24 ENCOUNTER — Other Ambulatory Visit: Payer: Self-pay | Admitting: General Surgery

## 2019-11-24 DIAGNOSIS — C434 Malignant melanoma of scalp and neck: Secondary | ICD-10-CM

## 2019-11-27 ENCOUNTER — Other Ambulatory Visit: Payer: Self-pay

## 2019-11-27 ENCOUNTER — Ambulatory Visit (HOSPITAL_COMMUNITY)
Admission: RE | Admit: 2019-11-27 | Discharge: 2019-11-27 | Disposition: A | Discharge status: Home / Self Care | Payer: Medicare Other | Source: Ambulatory Visit | Attending: General Surgery | Admitting: General Surgery

## 2019-11-27 DIAGNOSIS — C434 Malignant melanoma of scalp and neck: Secondary | ICD-10-CM | POA: Insufficient documentation

## 2019-11-29 ENCOUNTER — Other Ambulatory Visit: Payer: Self-pay

## 2019-11-29 ENCOUNTER — Encounter (HOSPITAL_BASED_OUTPATIENT_CLINIC_OR_DEPARTMENT_OTHER)
Admission: RE | Admit: 2019-11-29 | Discharge: 2019-11-29 | Disposition: A | Discharge status: Home / Self Care | Payer: Medicare Other | Source: Ambulatory Visit | Attending: General Surgery | Admitting: General Surgery

## 2019-11-29 ENCOUNTER — Encounter (HOSPITAL_BASED_OUTPATIENT_CLINIC_OR_DEPARTMENT_OTHER): Payer: Self-pay | Admitting: General Surgery

## 2019-11-29 DIAGNOSIS — Z01812 Encounter for preprocedural laboratory examination: Secondary | ICD-10-CM | POA: Insufficient documentation

## 2019-11-29 DIAGNOSIS — Z79899 Other long term (current) drug therapy: Secondary | ICD-10-CM | POA: Diagnosis not present

## 2019-11-29 DIAGNOSIS — C434 Malignant melanoma of scalp and neck: Secondary | ICD-10-CM | POA: Diagnosis not present

## 2019-11-29 DIAGNOSIS — K219 Gastro-esophageal reflux disease without esophagitis: Secondary | ICD-10-CM | POA: Diagnosis not present

## 2019-11-29 LAB — COMPREHENSIVE METABOLIC PANEL
ALT: 23 U/L (ref 0–44)
AST: 27 U/L (ref 15–41)
Albumin: 3.2 g/dL — ABNORMAL LOW (ref 3.5–5.0)
Alkaline Phosphatase: 111 U/L (ref 38–126)
Anion gap: 8 (ref 5–15)
BUN: 21 mg/dL (ref 8–23)
CO2: 25 mmol/L (ref 22–32)
Calcium: 8.6 mg/dL — ABNORMAL LOW (ref 8.9–10.3)
Chloride: 105 mmol/L (ref 98–111)
Creatinine, Ser: 0.8 mg/dL (ref 0.61–1.24)
GFR calc Af Amer: >60 mL/min (ref >60)
GFR calc non Af Amer: >60 mL/min (ref >60)
Glucose, Bld: 122 mg/dL — ABNORMAL HIGH (ref 70–99)
Potassium: 3.8 mmol/L (ref 3.5–5.1)
Sodium: 138 mmol/L (ref 135–145)
Total Bilirubin: 0.6 mg/dL (ref 0.3–1.2)
Total Protein: 7 g/dL (ref 6.5–8.1)

## 2019-11-29 LAB — CBC WITH DIFFERENTIAL/PLATELET
Abs Immature Granulocytes: 0.01 10*3/uL (ref 0.00–0.07)
Basophils Absolute: 0 10*3/uL (ref 0.0–0.1)
Basophils Relative: 1 %
Eosinophils Absolute: 0.2 10*3/uL (ref 0.0–0.5)
Eosinophils Relative: 4 %
HCT: 35.7 % — ABNORMAL LOW (ref 39.0–52.0)
Hemoglobin: 11.8 g/dL — ABNORMAL LOW (ref 13.0–17.0)
Immature Granulocytes: 0 %
Lymphocytes Relative: 26 %
Lymphs Abs: 1.3 10*3/uL (ref 0.7–4.0)
MCH: 33.1 pg (ref 26.0–34.0)
MCHC: 33.1 g/dL (ref 30.0–36.0)
MCV: 100 fL (ref 80.0–100.0)
Monocytes Absolute: 0.5 10*3/uL (ref 0.1–1.0)
Monocytes Relative: 9 %
Neutro Abs: 3 10*3/uL (ref 1.7–7.7)
Neutrophils Relative %: 60 %
Platelets: 192 10*3/uL (ref 150–400)
RBC: 3.57 MIL/uL — ABNORMAL LOW (ref 4.22–5.81)
RDW: 15.7 % — ABNORMAL HIGH (ref 11.5–15.5)
WBC: 5 10*3/uL (ref 4.0–10.5)
nRBC: 0 % (ref 0.0–0.2)

## 2019-11-29 LAB — LACTATE DEHYDROGENASE: LDH: 225 U/L — ABNORMAL HIGH (ref 98–192)

## 2019-11-29 NOTE — Progress Notes (Signed)
  PT GIVEN CHG BATH> WITH INSTRUCTIONS. PT verbalized understanding.        Enhanced Recovery after Surgery for Orthopedics Enhanced Recovery after Surgery is a protocol used to improve the stress on your body and your recovery after surgery.  Patient Instructions  . The night before surgery:  o No food after midnight. ONLY clear liquids after midnight  . The day of surgery (if you do NOT have diabetes):  o Drink ONE (1) Pre-Surgery Clear Ensure as directed.   o This drink was given to you during your hospital  pre-op appointment visit. o The pre-op nurse will instruct you on the time to drink the  Pre-Surgery Ensure depending on your surgery time. o Finish the drink at the designated time by the pre-op nurse.  o Nothing else to drink after completing the  Pre-Surgery Clear Ensure.  . The day of surgery (if you have diabetes): o Drink ONE (1) Gatorade 2 (G2) as directed. o This drink was given to you during your hospital  pre-op appointment visit.  o The pre-op nurse will instruct you on the time to drink the   Gatorade 2 (G2) depending on your surgery time. o Color of the Gatorade may vary. Red is not allowed. o Nothing else to drink after completing the  Gatorade 2 (G2).         If you have questions, please contact your surgeon's office.

## 2019-12-01 ENCOUNTER — Other Ambulatory Visit (HOSPITAL_COMMUNITY): Admission: RE | Admit: 2019-12-01 | Payer: Medicare Other | Source: Ambulatory Visit

## 2019-12-04 NOTE — H&P (Signed)
Miguel Thomas Documented: 11/21/2019 10:02 AM Location: [facility] Patient #: [id number] DOB: 1941-07-30 Widowed / Language: Miguel Thomas / Race: White Male   History of Present Illness Stark Klein MD; 11/21/2019 10:48 AM) The patient is a 79 year old male who presents with malignant melanoma. Pt is an 79 yo F referred by Dr. Allyson Sabal for a new diagnosis of melanoma of the scalp dx 10/17/2019. He had a lesion on his scalp that continued not to heal and was biopsied. 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 Peripheral margins were positive, but deep margin was negative. He had no LVI or satellitosis. No neurovascular invasion was present or ulceration. This has been complicated by the fact that his family got COVID and his wife just passed away from this. The patient [facility] a [facility]. He only had a mild cough and is asymptomatic at this point. His son had is as well 1/4 wtih no symptoms.   [facility] [id number] accession number.    Past Surgical History Antonietta Jewel, [place]; 11/21/2019 10:02 AM) Cataract Surgery  Bilateral. Colon Polyp Removal - Colonoscopy  Tonsillectomy   Diagnostic Studies History (Chanel Teressa Senter, CMA; 11/21/2019 10:02 AM) Colonoscopy  1-5 years ago  Allergies (Chanel Teressa Senter, CMA; 11/21/2019 10:04 AM) No Known Allergies [11/21/2019]: No Known Drug Allergies [11/21/2019]: Allergies Reconciled   Medication History (Chanel Teressa Senter, CMA; 11/21/2019 10:04 AM) Omeprazole (40MG  Capsule DR, Oral) Active. Calcium (Oral) Specific strength unknown - Active. Medications Reconciled  Social History (Chanel Teressa Senter, CMA; 11/21/2019 10:02 AM) Caffeine use  Carbonated beverages, Coffee, Tea. No alcohol use  No drug use  Tobacco use  Never smoker.  Family History Antonietta Jewel, [place]; 11/21/2019 10:02 AM) Arthritis  Brother, Son. Cancer  Son. Depression  Daughter. Heart Disease  Brother,  Father.  Other Problems (Chanel Teressa Senter, CMA; 11/21/2019 10:02 AM) Gastroesophageal Reflux Disease     Review of Systems (Chanel Nolan CMA; 11/21/2019 10:03 AM) General Not Present- Appetite Loss, Chills, Fatigue, Fever, Night Sweats, Weight Gain and Weight Loss. Skin Not Present- Change in Wart/Mole, Dryness, Hives, Jaundice, New Lesions, Non-Healing Wounds, Rash and Ulcer. HEENT Present- Hearing Loss, Ringing in the Ears and Wears glasses/contact lenses. Not Present- Earache, Hoarseness, Nose Bleed, Oral Ulcers, Seasonal Allergies, Sinus Pain, Sore Throat, Visual Disturbances and Yellow Eyes. Respiratory Not Present- Bloody sputum, Chronic Cough, Difficulty Breathing, Snoring and Wheezing. Breast Not Present- Breast Mass, Breast Pain, Nipple Discharge and Skin Changes. Cardiovascular Present- Swelling of Extremities. Not Present- Chest Pain, Difficulty Breathing Lying Down, Leg Cramps, Palpitations, Rapid Heart Rate and Shortness of Breath. Gastrointestinal Not Present- Abdominal Pain, Bloating, Bloody Stool, Change in Bowel Habits, Chronic diarrhea, Constipation, Difficulty Swallowing, Excessive gas, Gets full quickly at meals, Hemorrhoids, Indigestion, Nausea, Rectal Pain and Vomiting. Male Genitourinary Not Present- Blood in Urine, Change in Urinary Stream, Frequency, Impotence, Nocturia, Painful Urination, Urgency and Urine Leakage. Musculoskeletal Present- Back Pain. Not Present- Joint Pain, Joint Stiffness, Muscle Pain, Muscle Weakness and Swelling of Extremities. Neurological Not Present- Decreased Memory, Fainting, Headaches, Numbness, Seizures, Tingling, Tremor, Trouble walking and Weakness. Psychiatric Not Present- Anxiety, Bipolar, Change in Sleep Pattern, Depression, Fearful and Frequent crying. Endocrine Not Present- Cold Intolerance, Excessive Hunger, Hair Changes, Heat Intolerance, Hot flashes and New Diabetes. Hematology Present- Easy Bruising. Not Present- Blood Thinners,  Excessive bleeding, Gland problems, HIV and Persistent Infections.  Vitals (Chanel Nolan CMA; 11/21/2019 10:05 AM) 11/21/2019 10:04 AM Weight: 177.13 lb Height: 71in Body Surface  Area: 2 m Body Mass Index: 24.7 kg/m  Temp.: 51F  Pulse: 66 (Regular)  BP: 132/78 (Sitting, Left Arm, Standard)       Physical Exam Stark Klein MD; 11/21/2019 10:49 AM) General Mental Status-Alert. General Appearance-Consistent with stated age. Hydration-Well hydrated. Voice-Normal.  Integumentary Note: thin skin. reddish lesion near vertex of scalp. scabbed over. no LAD.   Head and Neck Head-normocephalic, atraumatic with no lesions or palpable masses. Trachea-midline. Thyroid Gland Characteristics - normal size and consistency.  Eye Eyeball - Bilateral-Extraocular movements intact. Sclera/Conjunctiva - Bilateral-No scleral icterus.  Chest and Lung Exam Chest and lung exam reveals -quiet, even and easy respiratory effort with no use of accessory muscles and on auscultation, normal breath sounds, no adventitious sounds and normal vocal resonance. Inspection Chest Wall - Normal. Back - normal.  Cardiovascular Cardiovascular examination reveals -normal heart sounds, regular rate and rhythm with no murmurs and normal pedal pulses bilaterally.  Abdomen Inspection Inspection of the abdomen reveals - No Hernias. Palpation/Percussion Palpation and Percussion of the abdomen reveal - Soft, Non Tender, No Rebound tenderness, No Rigidity (guarding) and No hepatosplenomegaly. Auscultation Auscultation of the abdomen reveals - Bowel sounds normal.  Neurologic Neurologic evaluation reveals -alert and oriented x 3 with no impairment of recent or remote memory. Mental Status-Normal.  Musculoskeletal Global Assessment -Note: no gross deformities.  Normal Exam - Left-Upper Extremity Strength Normal and Lower Extremity Strength Normal. Normal Exam -  Right-Upper Extremity Strength Normal and Lower Extremity Strength Normal.  Lymphatic Head & Neck  General Head & Neck Lymphatics: Bilateral - Description - Normal. Axillary  General Axillary Region: Bilateral - Description - Normal. Tenderness - Non Tender. Femoral & Inguinal  Generalized Femoral & Inguinal Lymphatics: Bilateral - Description - No Generalized lymphadenopathy.    Assessment & Plan Stark Klein MD; 11/21/2019 10:51 AM) MALIGNANT MELANOMA OF SCALP (C43.4) Impression: Pt is in excellent health, so I would get SLN bx. Will need lymphoscintogram in case he has parotid SLN. I would also do skin graft with acell coverage and bolster. The skin graft would be taken from his infraclavicular region.  I discussed this with patient and son. I reviewed risks of surgery which primarily include wound healing issues. I also discussed that if SLN is positive or if depth is much thicker than before, I would do PET scan and refer to oncology.  Will plan as soon as possible. Current Plans Pt Education - Melanoma: skin cancer You are being scheduled for surgery- Our schedulers will call you.  You should hear from our office's scheduling department within 5 working days about the location, date, and time of surgery. We try to make accommodations for patient's preferences in scheduling surgery, but sometimes the OR schedule or the surgeon's schedule prevents [place] from making those accommodations.  If you have not heard from our office [phone]) in 5 working days, call the office and ask for your surgeon's nurse.  If you have other questions about your diagnosis, plan, or surgery, call the office and ask for your surgeon's nurse.  Lymphatics and lymph nodes imaging(78195)(ACR 0 )(DSN [id number])(G-Code G1004(MG)) (Clinical Scenarios: No content available for this procedure)   Signed electronically by Stark Klein, MD (11/21/2019 10:51 AM)

## 2019-12-05 ENCOUNTER — Encounter (HOSPITAL_BASED_OUTPATIENT_CLINIC_OR_DEPARTMENT_OTHER)
Admission: RE | Disposition: A | Discharge status: Home / Self Care | Payer: Self-pay | Source: Home / Self Care | Attending: General Surgery

## 2019-12-05 ENCOUNTER — Encounter (HOSPITAL_COMMUNITY)
Admission: RE | Admit: 2019-12-05 | Discharge: 2019-12-05 | Disposition: A | Discharge status: Home / Self Care | Payer: Medicare Other | Source: Ambulatory Visit | Attending: General Surgery | Admitting: General Surgery

## 2019-12-05 ENCOUNTER — Encounter (HOSPITAL_BASED_OUTPATIENT_CLINIC_OR_DEPARTMENT_OTHER): Payer: Self-pay | Admitting: General Surgery

## 2019-12-05 ENCOUNTER — Other Ambulatory Visit: Payer: Self-pay

## 2019-12-05 ENCOUNTER — Ambulatory Visit (HOSPITAL_BASED_OUTPATIENT_CLINIC_OR_DEPARTMENT_OTHER)
Admission: RE | Admit: 2019-12-05 | Discharge: 2019-12-05 | Disposition: A | Discharge status: Home / Self Care | Payer: Medicare Other | Attending: General Surgery | Admitting: General Surgery

## 2019-12-05 ENCOUNTER — Ambulatory Visit (HOSPITAL_BASED_OUTPATIENT_CLINIC_OR_DEPARTMENT_OTHER): Payer: Medicare Other | Admitting: Anesthesiology

## 2019-12-05 DIAGNOSIS — C434 Malignant melanoma of scalp and neck: Secondary | ICD-10-CM | POA: Insufficient documentation

## 2019-12-05 DIAGNOSIS — M8580 Other specified disorders of bone density and structure, unspecified site: Secondary | ICD-10-CM | POA: Diagnosis not present

## 2019-12-05 DIAGNOSIS — Z8616 Personal history of COVID-19: Secondary | ICD-10-CM | POA: Diagnosis not present

## 2019-12-05 DIAGNOSIS — K219 Gastro-esophageal reflux disease without esophagitis: Secondary | ICD-10-CM | POA: Diagnosis not present

## 2019-12-05 DIAGNOSIS — D649 Anemia, unspecified: Secondary | ICD-10-CM | POA: Diagnosis not present

## 2019-12-05 HISTORY — PX: MELANOMA EXCISION: SHX5266

## 2019-12-05 HISTORY — PX: APPLICATION OF A-CELL OF EXTREMITY: SHX6303

## 2019-12-05 HISTORY — DX: Dyspnea, unspecified: R06.00

## 2019-12-05 HISTORY — PX: SKIN FULL THICKNESS GRAFT: SHX442

## 2019-12-05 SURGERY — EXCISION, MELANOMA
Anesthesia: General | Site: Scalp

## 2019-12-05 MED ORDER — CEFAZOLIN SODIUM-DEXTROSE 2-4 GM/100ML-% IV SOLN
2.0000 g | INTRAVENOUS | Status: AC
  Administered 2019-12-05: 2 g via INTRAVENOUS

## 2019-12-05 MED ORDER — ACETAMINOPHEN 500 MG PO TABS
1000.0000 mg | ORAL_TABLET | ORAL | Status: AC
  Administered 2019-12-05: 1000 mg via ORAL

## 2019-12-05 MED ORDER — ONDANSETRON HCL 4 MG/2ML IJ SOLN: 4.0000 mg | Freq: Once | INTRAMUSCULAR | Status: DC | PRN

## 2019-12-05 MED ORDER — FENTANYL CITRATE (PF) 100 MCG/2ML IJ SOLN
INTRAMUSCULAR | Status: AC
  Filled 2019-12-05: qty 2

## 2019-12-05 MED ORDER — MINERAL OIL LIGHT 100 % EX OIL
TOPICAL_OIL | CUTANEOUS | Status: DC | PRN
  Administered 2019-12-05: 1 via TOPICAL

## 2019-12-05 MED ORDER — EPHEDRINE 5 MG/ML INJ
INTRAVENOUS | Status: AC
  Filled 2019-12-05: qty 10

## 2019-12-05 MED ORDER — MIDAZOLAM HCL 2 MG/2ML IJ SOLN
INTRAMUSCULAR | Status: AC
  Filled 2019-12-05: qty 2

## 2019-12-05 MED ORDER — ENSURE PRE-SURGERY PO LIQD: 296.0000 mL | Freq: Once | ORAL | Status: DC

## 2019-12-05 MED ORDER — LIDOCAINE-EPINEPHRINE 1 %-1:100000 IJ SOLN
INTRAMUSCULAR | Status: DC | PRN
  Administered 2019-12-05: 31 mL

## 2019-12-05 MED ORDER — TECHNETIUM TC 99M SULFUR COLLOID FILTERED
0.5000 | Freq: Once | INTRAVENOUS | Status: AC | PRN
  Administered 2019-12-05: 0.5 via INTRADERMAL

## 2019-12-05 MED ORDER — CEFAZOLIN SODIUM-DEXTROSE 2-4 GM/100ML-% IV SOLN
INTRAVENOUS | Status: AC
  Filled 2019-12-05: qty 100

## 2019-12-05 MED ORDER — DEXAMETHASONE SODIUM PHOSPHATE 10 MG/ML IJ SOLN
INTRAMUSCULAR | Status: AC
  Filled 2019-12-05: qty 1

## 2019-12-05 MED ORDER — MINERAL OIL LIGHT OIL
TOPICAL_OIL | Status: AC
  Filled 2019-12-05: qty 10

## 2019-12-05 MED ORDER — CHLORHEXIDINE GLUCONATE CLOTH 2 % EX PADS: 6.0000 | MEDICATED_PAD | Freq: Once | CUTANEOUS | Status: DC

## 2019-12-05 MED ORDER — PROPOFOL 10 MG/ML IV BOLUS
INTRAVENOUS | Status: DC | PRN
  Administered 2019-12-05: 120 mg via INTRAVENOUS
  Administered 2019-12-05: 30 mg via INTRAVENOUS

## 2019-12-05 MED ORDER — FENTANYL CITRATE (PF) 100 MCG/2ML IJ SOLN
50.0000 ug | INTRAMUSCULAR | Status: AC | PRN
  Administered 2019-12-05: 25 ug via INTRAVENOUS
  Administered 2019-12-05 (×2): 50 ug via INTRAVENOUS
  Administered 2019-12-05 (×5): 25 ug via INTRAVENOUS

## 2019-12-05 MED ORDER — BUPIVACAINE HCL (PF) 0.25 % IJ SOLN
INTRAMUSCULAR | Status: AC
  Filled 2019-12-05: qty 30

## 2019-12-05 MED ORDER — PHENYLEPHRINE 40 MCG/ML (10ML) SYRINGE FOR IV PUSH (FOR BLOOD PRESSURE SUPPORT)
PREFILLED_SYRINGE | INTRAVENOUS | Status: DC | PRN
  Administered 2019-12-05 (×3): 80 ug via INTRAVENOUS
  Administered 2019-12-05: 120 ug via INTRAVENOUS

## 2019-12-05 MED ORDER — ONDANSETRON HCL 4 MG/2ML IJ SOLN
INTRAMUSCULAR | Status: AC
  Filled 2019-12-05: qty 2

## 2019-12-05 MED ORDER — LIDOCAINE 2% (20 MG/ML) 5 ML SYRINGE
INTRAMUSCULAR | Status: DC | PRN
  Administered 2019-12-05: 60 mg via INTRAVENOUS

## 2019-12-05 MED ORDER — LIDOCAINE 2% (20 MG/ML) 5 ML SYRINGE
INTRAMUSCULAR | Status: AC
  Filled 2019-12-05: qty 5

## 2019-12-05 MED ORDER — BUPIVACAINE HCL (PF) 0.5 % IJ SOLN
INTRAMUSCULAR | Status: AC
  Filled 2019-12-05: qty 30

## 2019-12-05 MED ORDER — EPHEDRINE SULFATE-NACL 50-0.9 MG/10ML-% IV SOSY
PREFILLED_SYRINGE | INTRAVENOUS | Status: DC | PRN
  Administered 2019-12-05: 10 mg via INTRAVENOUS

## 2019-12-05 MED ORDER — FENTANYL CITRATE (PF) 100 MCG/2ML IJ SOLN: 25.0000 ug | INTRAMUSCULAR | Status: DC | PRN

## 2019-12-05 MED ORDER — LACTATED RINGERS IV SOLN: INTRAVENOUS | Status: DC

## 2019-12-05 MED ORDER — OXYCODONE HCL 5 MG/5ML PO SOLN: 5.0000 mg | Freq: Once | ORAL | Status: DC | PRN

## 2019-12-05 MED ORDER — OXYCODONE HCL 5 MG PO TABS: 2.5000 mg | ORAL_TABLET | Freq: Four times a day (QID) | ORAL | 0 refills | Status: DC | PRN

## 2019-12-05 MED ORDER — LIDOCAINE-EPINEPHRINE 1 %-1:100000 IJ SOLN
INTRAMUSCULAR | Status: AC
  Filled 2019-12-05: qty 1

## 2019-12-05 MED ORDER — ONDANSETRON HCL 4 MG/2ML IJ SOLN
INTRAMUSCULAR | Status: DC | PRN
  Administered 2019-12-05: 4 mg via INTRAVENOUS

## 2019-12-05 MED ORDER — ACETAMINOPHEN 500 MG PO TABS
ORAL_TABLET | ORAL | Status: AC
  Filled 2019-12-05: qty 2

## 2019-12-05 MED ORDER — MIDAZOLAM HCL 2 MG/2ML IJ SOLN: 1.0000 mg | INTRAMUSCULAR | Status: DC | PRN

## 2019-12-05 MED ORDER — DEXAMETHASONE SODIUM PHOSPHATE 4 MG/ML IJ SOLN
INTRAMUSCULAR | Status: DC | PRN
  Administered 2019-12-05: 4 mg via INTRAVENOUS

## 2019-12-05 MED ORDER — OXYCODONE HCL 5 MG PO TABS: 5.0000 mg | ORAL_TABLET | Freq: Once | ORAL | Status: DC | PRN

## 2019-12-05 SURGICAL SUPPLY — 83 items
ADH SKN CLS APL DERMABOND .7 (GAUZE/BANDAGES/DRESSINGS) ×2
APL PRP STRL LF DISP 70% ISPRP (MISCELLANEOUS)
APL SKNCLS STERI-STRIP NONHPOA (GAUZE/BANDAGES/DRESSINGS)
BALL CTTN LRG ABS STRL LF (GAUZE/BANDAGES/DRESSINGS) ×2
BENZOIN TINCTURE PRP APPL 2/3 (GAUZE/BANDAGES/DRESSINGS) IMPLANT
BLADE HEX COATED 2.75 (ELECTRODE) ×3 IMPLANT
BLADE SURG 10 STRL SS (BLADE) ×3 IMPLANT
BLADE SURG 11 STRL SS (BLADE) ×1 IMPLANT
BLADE SURG 15 STRL LF DISP TIS (BLADE) ×2 IMPLANT
BLADE SURG 15 STRL SS (BLADE) ×3
BNDG COHESIVE 4X5 TAN STRL (GAUZE/BANDAGES/DRESSINGS) IMPLANT
CANISTER SUCT 1200ML W/VALVE (MISCELLANEOUS) ×1 IMPLANT
CHLORAPREP W/TINT 26 (MISCELLANEOUS) ×2 IMPLANT
CLIP VESOCCLUDE MED 6/CT (CLIP) IMPLANT
COTTONBALL LRG STERILE PKG (GAUZE/BANDAGES/DRESSINGS) ×1 IMPLANT
COVER BACK TABLE 60X90IN (DRAPES) ×1 IMPLANT
COVER MAYO STAND STRL (DRAPES) ×1 IMPLANT
COVER PROBE W GEL 5X96 (DRAPES) IMPLANT
COVER WAND RF STERILE (DRAPES) IMPLANT
DECANTER SPIKE VIAL GLASS SM (MISCELLANEOUS) IMPLANT
DERMABOND ADVANCED (GAUZE/BANDAGES/DRESSINGS) ×1
DERMABOND ADVANCED .7 DNX12 (GAUZE/BANDAGES/DRESSINGS) ×2 IMPLANT
DRAPE IMP U-DRAPE 54X76 (DRAPES) IMPLANT
DRAPE LAPAROSCOPIC ABDOMINAL (DRAPES) ×1 IMPLANT
DRAPE LAPAROTOMY 100X72 PEDS (DRAPES) IMPLANT
DRAPE U-SHAPE 76X120 STRL (DRAPES) ×1 IMPLANT
DRAPE UTILITY XL STRL (DRAPES) ×3 IMPLANT
DRSG EMULSION OIL 3X3 NADH (GAUZE/BANDAGES/DRESSINGS) ×1 IMPLANT
DRSG PAD ABDOMINAL 8X10 ST (GAUZE/BANDAGES/DRESSINGS) ×1 IMPLANT
ELECT REM PT RETURN 9FT ADLT (ELECTROSURGICAL) ×3
ELECTRODE REM PT RTRN 9FT ADLT (ELECTROSURGICAL) ×2 IMPLANT
GAUZE SPONGE 4X4 12PLY STRL (GAUZE/BANDAGES/DRESSINGS) ×1 IMPLANT
GAUZE SPONGE 4X4 12PLY STRL LF (GAUZE/BANDAGES/DRESSINGS) ×2 IMPLANT
GAUZE XEROFORM 1X8 LF (GAUZE/BANDAGES/DRESSINGS) ×1 IMPLANT
GAUZE XEROFORM 5X9 LF (GAUZE/BANDAGES/DRESSINGS) ×1 IMPLANT
GLOVE BIO SURGEON STRL SZ 6 (GLOVE) ×3 IMPLANT
GLOVE BIO SURGEON STRL SZ7.5 (GLOVE) ×1 IMPLANT
GLOVE BIOGEL PI IND STRL 6.5 (GLOVE) ×2 IMPLANT
GLOVE BIOGEL PI IND STRL 7.5 (GLOVE) IMPLANT
GLOVE BIOGEL PI INDICATOR 6.5 (GLOVE) ×2
GLOVE BIOGEL PI INDICATOR 7.5 (GLOVE) ×1
GLOVE ECLIPSE 6.5 STRL STRAW (GLOVE) ×1 IMPLANT
GOWN STRL REUS W/ TWL LRG LVL3 (GOWN DISPOSABLE) ×2 IMPLANT
GOWN STRL REUS W/TWL 2XL LVL3 (GOWN DISPOSABLE) ×3 IMPLANT
GOWN STRL REUS W/TWL LRG LVL3 (GOWN DISPOSABLE) ×6
MATRIX WOUND 3-LAYER 5X5 (Tissue) ×1 IMPLANT
MICROMATRIX 500MG (Tissue) ×3 IMPLANT
NDL HYPO 25X1 1.5 SAFETY (NEEDLE) ×2 IMPLANT
NDL SAFETY ECLIPSE 18X1.5 (NEEDLE) IMPLANT
NEEDLE HYPO 18GX1.5 SHARP (NEEDLE)
NEEDLE HYPO 25X1 1.5 SAFETY (NEEDLE) ×3 IMPLANT
NS IRRIG 1000ML POUR BTL (IV SOLUTION) ×1 IMPLANT
PACK BASIN DAY SURGERY FS (CUSTOM PROCEDURE TRAY) ×3 IMPLANT
PACK UNIVERSAL I (CUSTOM PROCEDURE TRAY) IMPLANT
PENCIL SMOKE EVACUATOR (MISCELLANEOUS) ×3 IMPLANT
SLEEVE SCD COMPRESS KNEE MED (MISCELLANEOUS) ×1 IMPLANT
SOLUTION PARTIC MCRMTRX 500MG (Tissue) IMPLANT
SPONGE LAP 18X18 RF (DISPOSABLE) ×4 IMPLANT
SPONGE LAP 4X18 RFD (DISPOSABLE) ×1 IMPLANT
STAPLER VISISTAT 35W (STAPLE) IMPLANT
STOCKINETTE IMPERVIOUS LG (DRAPES) IMPLANT
STRIP CLOSURE SKIN 1/2X4 (GAUZE/BANDAGES/DRESSINGS) IMPLANT
SUCTION FRAZIER HANDLE 10FR (MISCELLANEOUS) ×1
SUCTION TUBE FRAZIER 10FR DISP (MISCELLANEOUS) IMPLANT
SUT ETHILON 2 0 FS 18 (SUTURE) IMPLANT
SUT MNCRL AB 4-0 PS2 18 (SUTURE) ×3 IMPLANT
SUT PDS 3-0 CT2 (SUTURE) ×3
SUT PDS II 3-0 CT2 27 ABS (SUTURE) IMPLANT
SUT SILK 2 0 SH (SUTURE) ×3 IMPLANT
SUT SILK 3 0 TIES 17X18 (SUTURE)
SUT SILK 3-0 18XBRD TIE BLK (SUTURE) IMPLANT
SUT VIC AB 2-0 SH 27 (SUTURE)
SUT VIC AB 2-0 SH 27XBRD (SUTURE) IMPLANT
SUT VIC AB 3-0 SH 27 (SUTURE)
SUT VIC AB 3-0 SH 27X BRD (SUTURE) ×2 IMPLANT
SUT VICRYL 3-0 CR8 SH (SUTURE) ×3 IMPLANT
SUT VICRYL 4-0 PS2 18IN ABS (SUTURE) ×2 IMPLANT
SYR CONTROL 10ML LL (SYRINGE) ×3 IMPLANT
SYR TB 1ML LL NO SAFETY (SYRINGE) ×2 IMPLANT
TOWEL GREEN STERILE FF (TOWEL DISPOSABLE) ×4 IMPLANT
TRAY DSU PREP LF (CUSTOM PROCEDURE TRAY) ×1 IMPLANT
TUBE CONNECTING 20X1/4 (TUBING) ×1 IMPLANT
YANKAUER SUCT BULB TIP NO VENT (SUCTIONS) ×1 IMPLANT

## 2019-12-05 NOTE — Discharge Instructions (Addendum)
Change outer dressing daily.  Place liberal amount of surgilube or astroglide water-based lubricant on yellow mesh dressing on scalp daily.  Recover with gauze and tape/hat/or other means to keep in place.  If it seems that the mesh is dry, apply more lube or change twice daily.       [place] Office Phone Number [phone]   POST OP INSTRUCTIONS  Always review your discharge instruction sheet given to you by the facility where your surgery was performed.  IF YOU HAVE DISABILITY OR FAMILY LEAVE FORMS, YOU MUST BRING THEM TO THE OFFICE FOR PROCESSING.  DO NOT GIVE THEM TO YOUR DOCTOR.  1. A prescription for pain medication may be given to you upon discharge.  Take your pain medication as prescribed, if needed.  If narcotic pain medicine is not needed, then you may take acetaminophen (Tylenol) or ibuprofen (Advil) as needed. 2. Take your usually prescribed medications unless otherwise directed 3. If you need a refill on your pain medication, please contact your pharmacy.  They will contact our office to request authorization.  Prescriptions will not be filled after 5pm or on week-ends. 4. You should eat very light the first 24 hours after surgery, such as soup, crackers, pudding, etc.  Resume your normal diet the day after surgery 5. It is common to experience some constipation if taking pain medication after surgery.  Increasing fluid intake and taking a stool softener will usually help or prevent this problem from occurring.  A mild laxative (Milk of Magnesia or Miralax) should be taken according to package directions if there are no bowel movements after 48 hours. 6. You may shower in 48 hours WITH A SHOWER CAP ON.  The surgical glue will flake off in 2-3 weeks.   7. ACTIVITIES:  No strenuous activity or heavy lifting for 1 week.   a. You may drive when you no longer are taking prescription pain medication, you can comfortably wear a seatbelt, and you can safely maneuver  your car and apply brakes. b. RETURN TO WORK:  __________n/a._______________ Dennis Bast should see your doctor in the office for a follow-up appointment approximately three-four weeks after your surgery.    WHEN TO CALL YOUR DOCTOR: 1. Fever over 101.0 2. Nausea and/or vomiting. 3. Extreme swelling or bruising. 4. Continued bleeding from incision. 5. Increased pain, redness, or drainage from the incision.  The clinic staff is available to answer your questions during regular business hours.  Please don't hesitate to call and ask to speak to one of the nurses for clinical concerns.  If you have a medical emergency, go to the nearest emergency room or call 911.  A surgeon from [facility] is always on call at the hospital.  For further questions, please visit [email or web address]     Post Anesthesia Home Care Instructions  Activity: Get plenty of rest for the remainder of the day. A responsible individual must stay with you for 24 hours following the procedure.  For the next 24 hours, DO NOT: -Drive a car -Paediatric nurse -Drink alcoholic beverages -Take any medication unless instructed by your physician -Make any legal decisions or sign important papers.  Meals: Start with liquid foods such as gelatin or soup. Progress to regular foods as tolerated. Avoid greasy, spicy, heavy foods. If nausea and/or vomiting occur, drink only clear liquids until the nausea and/or vomiting subsides. Call your physician if vomiting continues.  Special Instructions/Symptoms: Your throat may feel dry or sore from the anesthesia or the  breathing tube placed in your throat during surgery. If this causes discomfort, gargle with warm salt water. The discomfort should disappear within 24 hours.  If you had a scopolamine patch placed behind your ear for the management of post- operative nausea and/or vomiting:  1. The medication in the patch is effective for 72 hours, after which it should be  removed.  Wrap patch in a tissue and discard in the trash. Wash hands thoroughly with soap and water. 2. You may remove the patch earlier than 72 hours if you experience unpleasant side effects which may include dry mouth, dizziness or visual disturbances. 3. Avoid touching the patch. Wash your hands with soap and water after contact with the patch.

## 2019-12-05 NOTE — Transfer of Care (Signed)
Immediate Anesthesia Transfer of Care Note  Patient: Miguel Thomas  Procedure(s) Performed: WIDE LOCAL EXCISION MELANOMA EXCISION OF SCALP (N/A Scalp) SKIN GRAFT FULL THICKNESS (N/A ) APPLICATION OF A-CELL (N/A )  Patient Location: PACU  Anesthesia Type:General  Level of Consciousness: drowsy  Airway & Oxygen Therapy: Patient Spontanous Breathing and Patient connected to face mask oxygen  Post-op Assessment: Report given to RN and Post -op Vital signs reviewed and stable  Post vital signs: Reviewed and stable  Last Vitals:  Vitals Value Taken Time  BP    Temp    Pulse 83 12/05/19 1057  Resp 17 12/05/19 1057  SpO2 99 % 12/05/19 1057  Vitals shown include unvalidated device data.  Last Pain:  Vitals:   12/05/19 0753  TempSrc: Tympanic  PainSc: 0-No pain         Complications: No apparent anesthesia complications

## 2019-12-05 NOTE — Progress Notes (Signed)
Assisted nuc med tech with nuc med injections. Side rails up, monitors on throughout procedure. See vital signs in flow sheet. Tolerated Procedure well. 

## 2019-12-05 NOTE — Interval H&P Note (Signed)
History and Physical Interval Note:  12/05/2019 9:10 AM  Miguel Thomas  has presented today for surgery, with the diagnosis of MELANOMA SCALP.  The various methods of treatment have been discussed with the patient and family. After consideration of risks, benefits and other options for treatment, the patient has consented to  Procedure(s): WIDE LOCAL EXCISION MELANOMA EXCISION OF SCALP (N/A) SKIN GRAFT FULL THICKNESS (N/A) APPLICATION OF A-CELL (N/A)  and possible sentinel lymph node biopsy.as a surgical intervention.  The patient's history has been reviewed, patient examined, no change in status, stable for surgery.  I have reviewed the patient's chart and labs.  Questions were answered to the patient's satisfaction.     Stark Klein

## 2019-12-05 NOTE — Anesthesia Preprocedure Evaluation (Addendum)
Anesthesia Evaluation    Reviewed: Allergy & Precautions, Patient's Chart, lab work & pertinent test results  History of Anesthesia Complications Negative for: history of anesthetic complications  Airway Mallampati: II  TM Distance: >3 FB Neck ROM: Full    Dental  (+) Dental Advisory Given, Teeth Intact   Pulmonary neg pulmonary ROS,    Pulmonary exam normal        Cardiovascular negative cardio ROS Normal cardiovascular exam     Neuro/Psych  Hearing loss  negative psych ROS   GI/Hepatic Neg liver ROS, hiatal hernia, GERD  Medicated, Esophageal stricture     Endo/Other  negative endocrine ROS  Renal/GU negative Renal ROS     Musculoskeletal negative musculoskeletal ROS (+)   Abdominal   Peds  Hematology  (+) anemia ,   Anesthesia Other Findings Covid + 10/2019 - mild cough at time of diagnosis, currently asymptomatic    Reproductive/Obstetrics                            Anesthesia Physical Anesthesia Plan  ASA: III  Anesthesia Plan: General   Post-op Pain Management:    Induction: Intravenous  PONV Risk Score and Plan: 2 and Treatment may vary due to age or medical condition and Ondansetron  Airway Management Planned: LMA  Additional Equipment: None  Intra-op Plan:   Post-operative Plan: Extubation in OR  Informed Consent: I have reviewed the patients History and Physical, chart, labs and discussed the procedure including the risks, benefits and alternatives for the proposed anesthesia with the patient or authorized representative who has indicated his/her understanding and acceptance.     Dental advisory given  Plan Discussed with: CRNA and Anesthesiologist  Anesthesia Plan Comments:        Anesthesia Quick Evaluation

## 2019-12-05 NOTE — Anesthesia Postprocedure Evaluation (Signed)
Anesthesia Post Note  Patient: Miguel Thomas  Procedure(s) Performed: WIDE LOCAL EXCISION MELANOMA EXCISION OF SCALP (N/A Scalp) SKIN GRAFT FULL THICKNESS FROM LEFT UPPER CHEST TO SCALP (Left Chest) APPLICATION OF A-CELL (N/A Scalp)     Patient location during evaluation: PACU Anesthesia Type: General Level of consciousness: awake and alert Pain management: pain level controlled Vital Signs Assessment: post-procedure vital signs reviewed and stable Respiratory status: spontaneous breathing, nonlabored ventilation and respiratory function stable Cardiovascular status: blood pressure returned to baseline and stable Postop Assessment: no apparent nausea or vomiting Anesthetic complications: no    Last Vitals:  Vitals:   12/05/19 1116 12/05/19 1154  BP:  101/86  Pulse: 94 84  Resp: (!) 22 16  Temp:  37 C  SpO2: 100% 97%    Last Pain:  Vitals:   12/05/19 1154  TempSrc: Oral  PainSc: 3                  Audry Pili

## 2019-12-05 NOTE — Op Note (Signed)
PRE-OPERATIVE DIAGNOSIS: cT3aN0 scalp melanoma  POST-OPERATIVE DIAGNOSIS:  Same  PROCEDURE:  Procedure(s): Wide local excision 1 cm margins, full thickness skin graft (from left supraclavicular region) defect 3.5 cm x 4.0 cm, placement of A-cell micromatrix and cytal  SURGEON:  Surgeon(s): Stark Klein, MD  ASSIST:  Sharyn Dross, RNFA  ANESTHESIA:   local and general  DRAINS: none   LOCAL MEDICATIONS USED:  MARCAINE    and XYLOCAINE   SPECIMEN:  Source of Specimen:  wide local excision scalp melanoma   FINDINGS:  Still significant heaped up tissue present.    DISPOSITION OF SPECIMEN:  PATHOLOGY  COUNTS:  YES  PLAN OF CARE: Discharge to home after PACU  PATIENT DISPOSITION:  PACU - hemodynamically stable.   PROCEDURE:   Pt was identified in the holding area, taken to the OR, and placed supine on the OR table.  General anesthesia was induced.  Time out was performed according to the surgical safety checklist.  When all was correct, we continued. We had planned sentinel node biopsy, but the technetium did not travel, so blue dye was NOT injected and sentinel node biopsy was not performed.   The patient was placed into the supine position.  The left upper chest and the scalp were prepped and draped in sterile fashion.  The melanoma was identified and 1 cm margins were marked out (given the scalp location, the smaller margin was used).  Local was administered under the melanoma and the adjacent tissue.  A #10 blade was used to incise the skin around the melanoma.  The cautery was used to take the dissection down to the fascia.  The skin was marked in situ with orientation sutures.  The cautery was used to take the specimen off the fascia, and it was passed off the table.    An elliptical portion of skin was marked out under the left clavicle in an area with redundant skin.  This incision was made with a #10 blade.  The skin was taken but some of the fatty tissue was left in place  underneath.  This was passed to the back table and kept warm with warm saline.  Skin hooks were used to elevate the edges of the incision and the skin was freed up in all directions.  This was pulled together in a transverse orientation, slightly obliquely. The skin was pulled together to check the tension. The  was irrigated and closed with 3-0 Vicryl deep dermal interrupted sutures and 4-0 Monocryl running subcuticular suture.  The skin graft donor site was dressed with dermabond.    The skin graft was meshed manually with the #11 blade.  It was trimmed to the appropriate size.  An envelope was created by placing interrupted 3-0 vicryls 2/3 around the melanoma site.  The micromatrix powder was placed in the envelope and the vicryls were used to complete the closure.  The cytal was placed over the top and held in place with eight interrupted PDS sutures.  On top of this, a bolster was placed with xeroform, cotton balls soaked in mineral oil.  The tails of the vicryls were tied down.    The melanoma site was cleaned, dried, and dressed with moist gauze, ABD, stockingette.    Needle, sponge, and instrument counts were correct.  The patient was awakened from anesthesia and taken to the PACU in stable condition.

## 2019-12-05 NOTE — Anesthesia Procedure Notes (Signed)
Procedure Name: LMA Insertion Date/Time: 12/05/2019 9:33 AM Performed by: Lieutenant Diego, CRNA Pre-anesthesia Checklist: Patient identified, Emergency Drugs available, Suction available and Patient being monitored Patient Re-evaluated:Patient Re-evaluated prior to induction Oxygen Delivery Method: Circle system utilized Preoxygenation: Pre-oxygenation with 100% oxygen Induction Type: IV induction Ventilation: Mask ventilation without difficulty LMA: LMA inserted LMA Size: 5.0 Number of attempts: 1 Placement Confirmation: positive ETCO2 and breath sounds checked- equal and bilateral Tube secured with: Tape Dental Injury: Teeth and Oropharynx as per pre-operative assessment

## 2019-12-06 ENCOUNTER — Encounter: Payer: Self-pay | Admitting: *Deleted

## 2019-12-07 ENCOUNTER — Other Ambulatory Visit: Payer: Self-pay

## 2019-12-08 LAB — SURGICAL PATHOLOGY

## 2019-12-12 ENCOUNTER — Other Ambulatory Visit: Payer: Self-pay | Admitting: Internal Medicine

## 2019-12-13 ENCOUNTER — Other Ambulatory Visit (HOSPITAL_COMMUNITY): Payer: Self-pay | Admitting: General Surgery

## 2019-12-13 ENCOUNTER — Other Ambulatory Visit: Payer: Self-pay | Admitting: General Surgery

## 2019-12-13 DIAGNOSIS — C434 Malignant melanoma of scalp and neck: Secondary | ICD-10-CM

## 2019-12-18 DIAGNOSIS — L905 Scar conditions and fibrosis of skin: Secondary | ICD-10-CM | POA: Diagnosis not present

## 2019-12-18 DIAGNOSIS — L821 Other seborrheic keratosis: Secondary | ICD-10-CM | POA: Diagnosis not present

## 2019-12-18 DIAGNOSIS — L814 Other melanin hyperpigmentation: Secondary | ICD-10-CM | POA: Diagnosis not present

## 2019-12-18 DIAGNOSIS — L570 Actinic keratosis: Secondary | ICD-10-CM | POA: Diagnosis not present

## 2019-12-18 DIAGNOSIS — I8311 Varicose veins of right lower extremity with inflammation: Secondary | ICD-10-CM | POA: Diagnosis not present

## 2019-12-25 ENCOUNTER — Other Ambulatory Visit: Payer: Self-pay

## 2019-12-25 ENCOUNTER — Ambulatory Visit (HOSPITAL_COMMUNITY)
Admission: RE | Admit: 2019-12-25 | Discharge: 2019-12-25 | Disposition: A | Discharge status: Home / Self Care | Payer: Medicare Other | Source: Ambulatory Visit | Attending: General Surgery | Admitting: General Surgery

## 2019-12-25 DIAGNOSIS — J849 Interstitial pulmonary disease, unspecified: Secondary | ICD-10-CM | POA: Insufficient documentation

## 2019-12-25 DIAGNOSIS — C434 Malignant melanoma of scalp and neck: Secondary | ICD-10-CM | POA: Insufficient documentation

## 2019-12-25 DIAGNOSIS — I700 Atherosclerosis of aorta: Secondary | ICD-10-CM | POA: Diagnosis not present

## 2019-12-25 DIAGNOSIS — I2510 Atherosclerotic heart disease of native coronary artery without angina pectoris: Secondary | ICD-10-CM | POA: Insufficient documentation

## 2019-12-25 LAB — GLUCOSE, CAPILLARY: Glucose-Capillary: 81 mg/dL (ref 70–99)

## 2019-12-25 MED ORDER — FLUDEOXYGLUCOSE F - 18 (FDG) INJECTION
9.2000 | Freq: Once | INTRAVENOUS | Status: AC | PRN
  Administered 2019-12-25: 9.2 via INTRAVENOUS

## 2020-01-11 DIAGNOSIS — Z961 Presence of intraocular lens: Secondary | ICD-10-CM | POA: Diagnosis not present

## 2020-01-11 DIAGNOSIS — H524 Presbyopia: Secondary | ICD-10-CM | POA: Diagnosis not present

## 2020-01-11 DIAGNOSIS — H5213 Myopia, bilateral: Secondary | ICD-10-CM | POA: Diagnosis not present

## 2020-01-11 DIAGNOSIS — H52203 Unspecified astigmatism, bilateral: Secondary | ICD-10-CM | POA: Diagnosis not present

## 2020-01-13 ENCOUNTER — Other Ambulatory Visit: Payer: Self-pay | Admitting: Internal Medicine

## 2020-01-15 ENCOUNTER — Other Ambulatory Visit: Payer: Self-pay | Admitting: Internal Medicine

## 2020-01-16 ENCOUNTER — Other Ambulatory Visit: Payer: Self-pay | Admitting: Internal Medicine

## 2020-01-17 ENCOUNTER — Telehealth: Payer: Self-pay | Admitting: Internal Medicine

## 2020-01-17 MED ORDER — OMEPRAZOLE 40 MG PO CPDR: DELAYED_RELEASE_CAPSULE | ORAL | 0 refills | Status: DC

## 2020-01-17 NOTE — Telephone Encounter (Signed)
Omeprazole sent in as requested. 

## 2020-01-29 ENCOUNTER — Other Ambulatory Visit: Payer: Self-pay

## 2020-01-29 ENCOUNTER — Ambulatory Visit (INDEPENDENT_AMBULATORY_CARE_PROVIDER_SITE_OTHER): Payer: Medicare Other | Admitting: Family

## 2020-01-29 VITALS — BP 144/69 | Temp 97.9°F | Resp 16 | Ht 71.0 in | Wt 184.0 lb

## 2020-01-29 DIAGNOSIS — F4321 Adjustment disorder with depressed mood: Secondary | ICD-10-CM | POA: Diagnosis not present

## 2020-01-29 DIAGNOSIS — C434 Malignant melanoma of scalp and neck: Secondary | ICD-10-CM

## 2020-01-29 DIAGNOSIS — L84 Corns and callosities: Secondary | ICD-10-CM

## 2020-01-29 DIAGNOSIS — J61 Pneumoconiosis due to asbestos and other mineral fibers: Secondary | ICD-10-CM

## 2020-01-29 NOTE — Progress Notes (Signed)
Subjective:    Patient ID: Miguel Thomas, male    DOB: 1941/05/02, 79 y.o.   MRN: [id number]  HPI  Patient is an 79 yr old male who presents today with chief complaint of bilateral foot pain.  He reports that it hurts when he stands on his feet at the location of some corns.  He continues to grieve the loss of his wife who died a few months back from COVID-8.  He recently had a melanoma excision from his scalp with skin graft and asked me to take a look at the wound today.  Review of Systems See HPI  Past Medical History:  Diagnosis Date  . Anemia   . Cancer ([place]) 10/2019   melanoma on scalp  . Dyspnea    had positive covid test 11-04-19  . GERD (gastroesophageal reflux disease) 12-12-04   ring like esophageal stricture with reflux esophagitis, also in 2011  . Hiatal hernia   . History of adenomatous polyp of colon    tubular adenom's in 2006;  2011; 03-05-2015  . History of esophageal stricture    s/p  dilatation 2006  and 2012  . Osteopenia   . Penile ulcer   . Plantar fasciitis   . Prostate nodule   . Pseudophakia of both eyes   . Wears hearing aid in both ears      Social History   Socioeconomic History  . Marital status: Widowed    Spouse name: Not on file  . Number of children: Not on file  . Years of education: Not on file  . Highest education level: Not on file  Occupational History  . Not on file  Tobacco Use  . Smoking status: Never Smoker  . Smokeless tobacco: Never Used  Substance and Sexual Activity  . Alcohol use: No    Alcohol/week: 0.0 standard drinks  . Drug use: No  . Sexual activity: Not Currently  Other Topics Concern  . Not on file  Social History Narrative   Retired   Married (2nd marriage)   Daily Caffeine- use up to [employer] daily         Social Determinants of Radio broadcast assistant Strain:   . Difficulty of Paying Living Expenses:   Food Insecurity:   . Worried About Charity fundraiser in the Last Year:   . Arboriculturist  in the Last Year:   Transportation Needs:   . Film/video editor (Medical):   Marland Kitchen Lack of Transportation (Non-Medical):   Physical Activity:   . Days of Exercise per Week:   . Minutes of Exercise per Session:   Stress:   . Feeling of Stress :   Social Connections:   . Frequency of Communication with Friends and Family:   . Frequency of Social Gatherings with Friends and Family:   . Attends Religious Services:   . Active Member of Clubs or Organizations:   . Attends Archivist Meetings:   Marland Kitchen Marital Status:   Intimate Partner Violence:   . Fear of Current or Ex-Partner:   . Emotionally Abused:   Marland Kitchen Physically Abused:   . Sexually Abused:     Past Surgical History:  Procedure Laterality Date  . APPLICATION OF A-CELL OF EXTREMITY N/A 12/05/2019   Procedure: APPLICATION OF A-CELL;  Surgeon: Stark Klein, MD;  Location: [place];  Service: General;  Laterality: N/A;  . CARDIOVASCULAR STRESS TEST  06/11/2011   normal nuclear study w/  no ischemia/  normal LV function and wall motion, ef 58%  . CATARACT EXTRACTION W/ INTRAOCULAR LENS  IMPLANT, BILATERAL  2008 approx.  . COLONOSCOPY  last one 03-05-2015  . ESOPHAGOGASTRODUODENOSCOPY  last one 01-06-2011  . INGUINAL HERNIA REPAIR Right 1990's  . MELANOMA EXCISION N/A 12/05/2019   Procedure: WIDE LOCAL EXCISION MELANOMA EXCISION OF SCALP;  Surgeon: Stark Klein, MD;  Location: [place];  Service: General;  Laterality: N/A;  . PENILE BIOPSY N/A 03/12/2017   Procedure: EXCISIONAL GLANS PENILE BIOPSY;  Surgeon: Festus Aloe, MD;  Location: [facility];  Service: Urology;  Laterality: N/A;  . PROSTATE BIOPSY  11/2011   normal  . SKIN FULL THICKNESS GRAFT Left 12/05/2019   Procedure: SKIN GRAFT FULL THICKNESS FROM LEFT UPPER CHEST TO SCALP;  Surgeon: Stark Klein, MD;  Location: [place];  Service: General;  Laterality: Left;  . TONSILLECTOMY  child    Family  History  Problem Relation Age of Onset  . Coronary artery disease Other   . Heart disease Other        rheumatic  . Colon cancer Neg Hx   . Esophageal cancer Neg Hx   . Rectal cancer Neg Hx   . Stomach cancer Neg Hx     No Known Allergies  Current Outpatient Medications on File Prior to Visit  Medication Sig Dispense Refill  . Ascorbic Acid (VITAMIN C) 1000 MG tablet Take 1,000 mg by mouth daily.    . Cholecalciferol (VITAMIN D) 125 MCG (5000 UT) CAPS Take by mouth.    Marland Kitchen omeprazole (PRILOSEC) 40 MG capsule TAKE 1 CAPSULE BY MOUTH ONCE DAILY BEFORE BREAKFAST 30 capsule 0  . oxyCODONE (OXY IR/ROXICODONE) 5 MG immediate release tablet Take 0.5-1 tablets (2.5-5 mg total) by mouth every 6 (six) hours as needed for severe pain. 10 tablet 0  . Turmeric 500 MG CAPS Take by mouth.    . vitamin B-12 (CYANOCOBALAMIN) 500 MCG tablet Take 500 mcg by mouth daily.    Marland Kitchen zinc gluconate 50 MG tablet Take 50 mg by mouth daily.     No current facility-administered medications on file prior to visit.    There were no vitals taken for this visit.      Objective:   Physical Exam Constitutional:      General: He is not in acute distress.    Appearance: He is well-developed.  HENT:     Head: Normocephalic and atraumatic.  Cardiovascular:     Rate and Rhythm: Normal rate and regular rhythm.     Heart sounds: No murmur.  Pulmonary:     Effort: Pulmonary effort is normal. No respiratory distress.     Breath sounds: Normal breath sounds. No wheezing or rales.  Feet:     Comments: Heavily callused feet noted bilaterally.  He has thickened corn on the plantar surface of the right foot about 2 inches from the base of the third toe.  Similar corn lesion on the left foot. Skin:    General: Skin is warm and dry.     Comments: Head with skin graft appears to be healthy and appropriately.  Granulation tissue is noted  Neurological:     Mental Status: He is alert and oriented to person, place, and time.    Psychiatric:        Behavior: Behavior normal.        Thought Content: Thought content normal.  Assessment & Plan:  Foot discomfort secondary to bilateral corns-debrided a sterile scalpel.  Patient tolerated debridement without pain.  Grief reaction-patient appears to be grieving appropriately.  Support provided.  Melanoma of the scalp-status post skin graft.  Appears to be healing appropriately.  This is being monitored by his general surgeon.  Asbestosis- reviewed his most recent PET scan.  Note was made of asbestos related changes.  He will need a follow-up CT in 1 year.  This visit occurred during the SARS-CoV-2 public health emergency.  Safety protocols were in place, including screening questions prior to the visit, additional usage of staff PPE, and extensive cleaning of exam room while observing appropriate contact time as indicated for disinfecting solutions.

## 2020-01-29 NOTE — Patient Instructions (Signed)
Call if your foot pain does not improve.

## 2020-02-10 ENCOUNTER — Ambulatory Visit: Payer: Medicare Other | Attending: Internal Medicine

## 2020-02-10 DIAGNOSIS — Z23 Encounter for immunization: Secondary | ICD-10-CM

## 2020-02-10 NOTE — Progress Notes (Signed)
   Covid-19 Vaccination Clinic  Name:  Miguel Thomas    MRN: [id number] DOB: [birth year]  02/10/2020  Miguel Thomas was observed post Covid-19 immunization for 15 minutes without incident. He was provided with Vaccine Information Sheet and instruction to access the V-Safe system.   Miguel Thomas was instructed to call 911 with any severe reactions post vaccine: Marland Kitchen Difficulty breathing  . Swelling of face and throat  . A fast heartbeat  . A bad rash all over body  . Dizziness and weakness   Immunizations Administered    Name Date Dose VIS Date Route   Pfizer COVID-19 Vaccine 02/10/2020 12:25 PM 0.3 mL 10/13/2019 Intramuscular   Manufacturer: [employer]   Lot: [id number]   [place]: [id number]

## 2020-02-13 ENCOUNTER — Encounter: Payer: Self-pay | Admitting: Internal Medicine

## 2020-02-13 ENCOUNTER — Ambulatory Visit (INDEPENDENT_AMBULATORY_CARE_PROVIDER_SITE_OTHER): Payer: Medicare Other | Admitting: Internal Medicine

## 2020-02-13 VITALS — BP 112/82 | HR 80 | Temp 98.1°F | Ht 70.5 in | Wt 180.0 lb

## 2020-02-13 DIAGNOSIS — K219 Gastro-esophageal reflux disease without esophagitis: Secondary | ICD-10-CM

## 2020-02-13 DIAGNOSIS — R1319 Other dysphagia: Secondary | ICD-10-CM

## 2020-02-13 DIAGNOSIS — R1310 Dysphagia, unspecified: Secondary | ICD-10-CM | POA: Diagnosis not present

## 2020-02-13 MED ORDER — OMEPRAZOLE 40 MG PO CPDR: DELAYED_RELEASE_CAPSULE | ORAL | 11 refills | Status: DC

## 2020-02-13 NOTE — Progress Notes (Signed)
Miguel Thomas 79 y.o. 10/22/1941 [id number]  Assessment & Plan:   Encounter Diagnoses  Name Primary?  . Gastroesophageal reflux disease, unspecified whether esophagitis present Yes  . Esophageal dysphagia     I think his dysphagia is relatively stable, it certainly could be a dysmotility problem.  In the past he may have had a stricture that was dilated.  He is not keen on scheduling an endoscopy, we will do a barium swallow with tablet and I have refilled his omeprazole.  He certainly could get omeprazole through PCP if desired and that was acceptable to his PCP.  I appreciate the opportunity to care for this patient. CC: [name], NP     Subjective:   Chief Complaint: Acid reflux, refill omeprazole  HPI 79 year old white man here has a history of chronic reflux some transient intestinal metaplasia at the GE junction i.e. not Barrett's, and not reproduced on repeat biopsies, with request for refill on omeprazole.  Last seen over 2 years ago.  He says he does not have any significant heartburn but does have episodic rare dysphagia it is hard for him to give a frequency.  Health history recently notable for wide excision of a melanoma of the scalp that at least at this point does not appear to be metastatic.   Wt Readings from Last 3 Encounters:  02/13/20 180 lb (81.6 kg)  01/29/20 184 lb (83.5 kg)  12/05/19 175 lb 14.8 oz (79.8 kg)     No Known Allergies Current Meds  Medication Sig  . Ascorbic Acid (VITAMIN C) 1000 MG tablet Take 1,000 mg by mouth daily.  Marland Kitchen omeprazole (PRILOSEC) 40 MG capsule TAKE 1 CAPSULE BY MOUTH ONCE DAILY BEFORE BREAKFAST  . Turmeric 500 MG CAPS Take by mouth.  . vitamin B-12 (CYANOCOBALAMIN) 500 MCG tablet Take 500 mcg by mouth daily.  Marland Kitchen zinc gluconate 50 MG tablet Take 50 mg by mouth daily.   Past Medical History:  Diagnosis Date  . Anemia   . Cancer ([place]) 10/2019   melanoma on scalp  . Dyspnea    had positive covid test 11-04-19   . GERD (gastroesophageal reflux disease) 12-12-04   ring like esophageal stricture with reflux esophagitis, also in 2011  . Hiatal hernia   . History of adenomatous polyp of colon    tubular adenom's in 2006;  2011; 03-05-2015  . History of esophageal stricture    s/p  dilatation 2006  and 2012  . Osteopenia   . Penile ulcer   . Plantar fasciitis   . Prostate nodule   . Pseudophakia of both eyes   . Wears hearing aid in both ears    Past Surgical History:  Procedure Laterality Date  . APPLICATION OF A-CELL OF EXTREMITY N/A 12/05/2019   Procedure: APPLICATION OF A-CELL;  Surgeon: Stark Klein, MD;  Location: [place];  Service: General;  Laterality: N/A;  . CARDIOVASCULAR STRESS TEST  06/11/2011   normal nuclear study w/ no ischemia/  normal LV function and wall motion, ef 58%  . CATARACT EXTRACTION W/ INTRAOCULAR LENS  IMPLANT, BILATERAL  2008 approx.  . COLONOSCOPY  last one 03-05-2015  . ESOPHAGOGASTRODUODENOSCOPY  last one 01-06-2011  . INGUINAL HERNIA REPAIR Right 1990's  . MELANOMA EXCISION N/A 12/05/2019   Procedure: WIDE LOCAL EXCISION MELANOMA EXCISION OF SCALP;  Surgeon: Stark Klein, MD;  Location: [place];  Service: General;  Laterality: N/A;  . PENILE BIOPSY N/A 03/12/2017   Procedure: EXCISIONAL  GLANS PENILE BIOPSY;  Surgeon: Festus Aloe, MD;  Location: [facility];  Service: Urology;  Laterality: N/A;  . PROSTATE BIOPSY  11/2011   normal  . SKIN FULL THICKNESS GRAFT Left 12/05/2019   Procedure: SKIN GRAFT FULL THICKNESS FROM LEFT UPPER CHEST TO SCALP;  Surgeon: Stark Klein, MD;  Location: [place];  Service: General;  Laterality: Left;  . TONSILLECTOMY  child   Social History   Social History Narrative   Retired   Married (2nd marriage)   Daily Caffeine- use up to 6/ daily         family history includes Coronary artery disease in an other family member; Heart disease in an other family  member.   Review of Systems As above  Objective:   Physical Exam BP 112/82   Pulse 80   Temp 98.1 F (36.7 C)   Ht 5' 10.5" (1.791 m)   Wt 180 lb (81.6 kg)   BMI 25.46 kg/m  Large bandage on scalp, vertex area   21 minutes total time on this visit

## 2020-02-13 NOTE — Patient Instructions (Signed)
You have been scheduled for a Barium Esophogram at [facility] (1st floor of the hospital) on 02-16-20 at 9:00 am. Please arrive 15 minutes prior to your appointment for registration. Make certain not to have anything to eat or drink 3 hours prior to your test. If you need to reschedule for any reason, please contact radiology at [phone] to do so. __________________________________________________________________ A barium swallow is an examination that concentrates on views of the esophagus. This tends to be a double contrast exam (barium and two liquids which, when combined, create a gas to distend the wall of the oesophagus) or single contrast (non-ionic iodine based). The study is usually tailored to your symptoms so a good history is essential. Attention is paid during the study to the form, structure and configuration of the esophagus, looking for functional disorders (such as aspiration, dysphagia, achalasia, motility and reflux) EXAMINATION You may be asked to change into a gown, depending on the type of swallow being performed. A radiologist and radiographer will perform the procedure. The radiologist will advise you of the type of contrast selected for your procedure and direct you during the exam. You will be asked to stand, sit or lie in several different positions and to hold a small amount of fluid in your mouth before being asked to swallow while the imaging is performed .In some instances you may be asked to swallow barium coated marshmallows to assess the motility of a solid food bolus. The exam can be recorded as a digital or video fluoroscopy procedure. POST PROCEDURE It will take 1-2 days for the barium to pass through your system. To facilitate this, it is important, unless otherwise directed, to increase your fluids for the next 24-48hrs and to resume your normal diet.  This test typically takes about 30 minutes to  perform. __________________________________________________________________________________  We have sent the following medications to your pharmacy for you to pick up at your convenience:  Omeprazole 40 mg daily   Thank you for trusting me with your gastrointestinal care!    Silvano Rusk, MD

## 2020-02-16 ENCOUNTER — Ambulatory Visit (HOSPITAL_COMMUNITY)
Admission: RE | Admit: 2020-02-16 | Discharge: 2020-02-16 | Disposition: A | Discharge status: Home / Self Care | Payer: Medicare Other | Source: Ambulatory Visit | Attending: Internal Medicine | Admitting: Internal Medicine

## 2020-02-16 ENCOUNTER — Other Ambulatory Visit: Payer: Self-pay

## 2020-02-16 DIAGNOSIS — K219 Gastro-esophageal reflux disease without esophagitis: Secondary | ICD-10-CM | POA: Diagnosis not present

## 2020-02-16 DIAGNOSIS — R1310 Dysphagia, unspecified: Secondary | ICD-10-CM | POA: Insufficient documentation

## 2020-02-16 DIAGNOSIS — R1319 Other dysphagia: Secondary | ICD-10-CM

## 2020-02-16 DIAGNOSIS — K449 Diaphragmatic hernia without obstruction or gangrene: Secondary | ICD-10-CM | POA: Diagnosis not present

## 2020-02-24 ENCOUNTER — Other Ambulatory Visit: Payer: Self-pay

## 2020-02-24 ENCOUNTER — Inpatient Hospital Stay (HOSPITAL_COMMUNITY)
Admission: EM | Admit: 2020-02-24 | Discharge: 2020-02-29 | DRG: 418 | Disposition: A | Discharge status: Home Health | Payer: Medicare Other | Attending: Internal Medicine | Admitting: Internal Medicine

## 2020-02-24 ENCOUNTER — Encounter (HOSPITAL_COMMUNITY): Payer: Self-pay | Admitting: Emergency Medicine

## 2020-02-24 ENCOUNTER — Emergency Department (HOSPITAL_COMMUNITY): Payer: Medicare Other

## 2020-02-24 DIAGNOSIS — K851 Biliary acute pancreatitis without necrosis or infection: Principal | ICD-10-CM | POA: Diagnosis present

## 2020-02-24 DIAGNOSIS — K219 Gastro-esophageal reflux disease without esophagitis: Secondary | ICD-10-CM | POA: Diagnosis present

## 2020-02-24 DIAGNOSIS — K8010 Calculus of gallbladder with chronic cholecystitis without obstruction: Secondary | ICD-10-CM | POA: Diagnosis not present

## 2020-02-24 DIAGNOSIS — Z20822 Contact with and (suspected) exposure to covid-19: Secondary | ICD-10-CM | POA: Diagnosis present

## 2020-02-24 DIAGNOSIS — D649 Anemia, unspecified: Secondary | ICD-10-CM | POA: Diagnosis not present

## 2020-02-24 DIAGNOSIS — K859 Acute pancreatitis without necrosis or infection, unspecified: Secondary | ICD-10-CM | POA: Diagnosis not present

## 2020-02-24 DIAGNOSIS — Z85820 Personal history of malignant melanoma of skin: Secondary | ICD-10-CM

## 2020-02-24 DIAGNOSIS — Z961 Presence of intraocular lens: Secondary | ICD-10-CM | POA: Diagnosis not present

## 2020-02-24 DIAGNOSIS — R1310 Dysphagia, unspecified: Secondary | ICD-10-CM | POA: Diagnosis present

## 2020-02-24 DIAGNOSIS — R7989 Other specified abnormal findings of blood chemistry: Secondary | ICD-10-CM | POA: Diagnosis not present

## 2020-02-24 DIAGNOSIS — K8050 Calculus of bile duct without cholangitis or cholecystitis without obstruction: Secondary | ICD-10-CM | POA: Diagnosis not present

## 2020-02-24 DIAGNOSIS — Z8616 Personal history of COVID-19: Secondary | ICD-10-CM

## 2020-02-24 DIAGNOSIS — H9190 Unspecified hearing loss, unspecified ear: Secondary | ICD-10-CM | POA: Diagnosis not present

## 2020-02-24 DIAGNOSIS — K449 Diaphragmatic hernia without obstruction or gangrene: Secondary | ICD-10-CM | POA: Diagnosis not present

## 2020-02-24 DIAGNOSIS — K8511 Biliary acute pancreatitis with uninfected necrosis: Secondary | ICD-10-CM | POA: Diagnosis not present

## 2020-02-24 DIAGNOSIS — K838 Other specified diseases of biliary tract: Secondary | ICD-10-CM

## 2020-02-24 DIAGNOSIS — M8580 Other specified disorders of bone density and structure, unspecified site: Secondary | ICD-10-CM | POA: Diagnosis not present

## 2020-02-24 DIAGNOSIS — K769 Liver disease, unspecified: Secondary | ICD-10-CM | POA: Diagnosis not present

## 2020-02-24 DIAGNOSIS — R1010 Upper abdominal pain, unspecified: Secondary | ICD-10-CM | POA: Diagnosis not present

## 2020-02-24 DIAGNOSIS — K8020 Calculus of gallbladder without cholecystitis without obstruction: Secondary | ICD-10-CM | POA: Diagnosis not present

## 2020-02-24 DIAGNOSIS — K8051 Calculus of bile duct without cholangitis or cholecystitis with obstruction: Secondary | ICD-10-CM | POA: Diagnosis present

## 2020-02-24 DIAGNOSIS — Z974 Presence of external hearing-aid: Secondary | ICD-10-CM | POA: Diagnosis not present

## 2020-02-24 DIAGNOSIS — K5730 Diverticulosis of large intestine without perforation or abscess without bleeding: Secondary | ICD-10-CM | POA: Diagnosis not present

## 2020-02-24 DIAGNOSIS — R109 Unspecified abdominal pain: Secondary | ICD-10-CM

## 2020-02-24 LAB — COMPREHENSIVE METABOLIC PANEL
ALT: 289 U/L — ABNORMAL HIGH (ref 0–44)
AST: 249 U/L — ABNORMAL HIGH (ref 15–41)
Albumin: 3.9 g/dL (ref 3.5–5.0)
Alkaline Phosphatase: 300 U/L — ABNORMAL HIGH (ref 38–126)
Anion gap: 11 (ref 5–15)
BUN: 17 mg/dL (ref 8–23)
CO2: 21 mmol/L — ABNORMAL LOW (ref 22–32)
Calcium: 8.8 mg/dL — ABNORMAL LOW (ref 8.9–10.3)
Chloride: 107 mmol/L (ref 98–111)
Creatinine, Ser: 0.87 mg/dL (ref 0.61–1.24)
GFR calc Af Amer: >60 mL/min (ref >60)
GFR calc non Af Amer: >60 mL/min (ref >60)
Glucose, Bld: 155 mg/dL — ABNORMAL HIGH (ref 70–99)
Potassium: 4.2 mmol/L (ref 3.5–5.1)
Sodium: 139 mmol/L (ref 135–145)
Total Bilirubin: 3.4 mg/dL — ABNORMAL HIGH (ref 0.3–1.2)
Total Protein: 7.7 g/dL (ref 6.5–8.1)

## 2020-02-24 LAB — RESPIRATORY PANEL BY RT PCR (FLU A&B, COVID)
Influenza A by PCR: NEGATIVE
Influenza B by PCR: NEGATIVE
SARS Coronavirus 2 by RT PCR: NEGATIVE

## 2020-02-24 LAB — URINALYSIS, ROUTINE W REFLEX MICROSCOPIC
Bilirubin Urine: NEGATIVE
Glucose, UA: NEGATIVE mg/dL
Hgb urine dipstick: NEGATIVE
Ketones, ur: NEGATIVE mg/dL
Leukocytes,Ua: NEGATIVE
Nitrite: NEGATIVE
Protein, ur: NEGATIVE mg/dL
Specific Gravity, Urine: 1.01 (ref 1.005–1.030)
pH: 5 (ref 5.0–8.0)

## 2020-02-24 LAB — GLUCOSE, CAPILLARY: Glucose-Capillary: 99 mg/dL (ref 70–99)

## 2020-02-24 LAB — CBC
HCT: 43.6 % (ref 39.0–52.0)
Hemoglobin: 14.1 g/dL (ref 13.0–17.0)
MCH: 32.7 pg (ref 26.0–34.0)
MCHC: 32.3 g/dL (ref 30.0–36.0)
MCV: 101.2 fL — ABNORMAL HIGH (ref 80.0–100.0)
Platelets: 220 10*3/uL (ref 150–400)
RBC: 4.31 MIL/uL (ref 4.22–5.81)
RDW: 13.8 % (ref 11.5–15.5)
WBC: 7.4 10*3/uL (ref 4.0–10.5)
nRBC: 0 % (ref 0.0–0.2)

## 2020-02-24 LAB — CREATININE, SERUM
Creatinine, Ser: 0.81 mg/dL (ref 0.61–1.24)
GFR calc Af Amer: >60 mL/min (ref >60)
GFR calc non Af Amer: >60 mL/min (ref >60)

## 2020-02-24 LAB — LIPASE, BLOOD: Lipase: 4135 U/L — ABNORMAL HIGH (ref 11–51)

## 2020-02-24 MED ORDER — SODIUM CHLORIDE 0.9 % IV SOLN: INTRAVENOUS | Status: DC

## 2020-02-24 MED ORDER — SODIUM CHLORIDE 0.9% FLUSH: 3.0000 mL | Freq: Once | INTRAVENOUS | Status: AC

## 2020-02-24 MED ORDER — ONDANSETRON HCL 4 MG/2ML IJ SOLN: 4.0000 mg | Freq: Four times a day (QID) | INTRAMUSCULAR | Status: DC | PRN

## 2020-02-24 MED ORDER — PANTOPRAZOLE SODIUM 40 MG IV SOLR
40.0000 mg | INTRAVENOUS | Status: DC
  Administered 2020-02-24 – 2020-02-26 (×3): 40 mg via INTRAVENOUS
  Filled 2020-02-24 (×3): qty 40

## 2020-02-24 MED ORDER — ONDANSETRON HCL 4 MG PO TABS: 4.0000 mg | ORAL_TABLET | Freq: Four times a day (QID) | ORAL | Status: DC | PRN

## 2020-02-24 MED ORDER — ONDANSETRON 4 MG PO TBDP
4.0000 mg | ORAL_TABLET | Freq: Once | ORAL | Status: AC
  Administered 2020-02-24: 4 mg via ORAL
  Filled 2020-02-24: qty 1

## 2020-02-24 MED ORDER — HEPARIN SODIUM (PORCINE) 5000 UNIT/ML IJ SOLN
5000.0000 [IU] | Freq: Three times a day (TID) | INTRAMUSCULAR | Status: DC
  Administered 2020-02-24 – 2020-02-26 (×6): 5000 [IU] via SUBCUTANEOUS
  Filled 2020-02-24 (×7): qty 1

## 2020-02-24 MED ORDER — MORPHINE SULFATE (PF) 2 MG/ML IV SOLN
2.0000 mg | INTRAVENOUS | Status: DC | PRN
  Administered 2020-02-24: 2 mg via INTRAVENOUS
  Filled 2020-02-24: qty 1

## 2020-02-24 MED ORDER — SODIUM CHLORIDE (PF) 0.9 % IJ SOLN
INTRAMUSCULAR | Status: AC
  Filled 2020-02-24: qty 50

## 2020-02-24 MED ORDER — SODIUM CHLORIDE 0.9 % IV BOLUS
1000.0000 mL | Freq: Once | INTRAVENOUS | Status: AC
  Administered 2020-02-24: 1000 mL via INTRAVENOUS

## 2020-02-24 MED ORDER — IOHEXOL 300 MG/ML  SOLN
100.0000 mL | Freq: Once | INTRAMUSCULAR | Status: AC | PRN
  Administered 2020-02-24: 100 mL via INTRAVENOUS

## 2020-02-24 NOTE — H&P (Addendum)
History and Physical    Miguel Thomas [id number] DOB: [birth year] DOA: 02/24/2020  PCP: [name], NP   Patient coming from: Home  I have personally briefly reviewed patient's old medical records in [place]  Chief Complaint: Nausea and abdominal pain  HPI: Miguel Thomas is a 79 y.o. male with medical history significant of GERD/chronic reflux/dysphagia/melanoma scalp, COVID-19 in January 2021 presented with nausea and abdominal pain.  Patient states that he has been nauseous since 7 PM last night.  This morning, he felt that his nausea had increased, did vomit once but it was nonbloody and nonbilious.  He also complained of some upper abdominal pain radiating to back, sharp in nature, almost constant currently, 6-7 out of 10 in intensity with no aggravating or relieving factors.  He denies any fever, chills, chest pain, shortness of breath, cough, change in bowel habits, dysuria.  He was recently seen by Dr. Delrae Rend for dysphagia.  ED Course: He was found to have elevated LFTs along with lipase of 4135.  CT of the abdomen and pelvis showed findings consistent with acute pancreatitis without necrosis along with common bile duct dilatation 8.5 mm to 12 mm which is new compared to the February 2021 but no common bile duct stone.  GI has been paged by ED provider.  Hospitalist service was called to evaluate the patient  Review of Systems: As per HPI otherwise all systems were reviewed and are negative.   Past Medical History:  Diagnosis Date  . Anemia   . Cancer ([place]) 10/2019   melanoma on scalp  . Dyspnea    had positive covid test 11-04-19  . GERD (gastroesophageal reflux disease) 12-12-04   ring like esophageal stricture with reflux esophagitis, also in 2011  . Hiatal hernia   . History of adenomatous polyp of colon    tubular adenom's in 2006;  2011; 03-05-2015  . History of esophageal stricture    s/p  dilatation 2006  and 2012  . Osteopenia   . Penile ulcer   .  Plantar fasciitis   . Prostate nodule   . Pseudophakia of both eyes   . Wears hearing aid in both ears     Past Surgical History:  Procedure Laterality Date  . APPLICATION OF A-CELL OF EXTREMITY N/A 12/05/2019   Procedure: APPLICATION OF A-CELL;  Surgeon: Stark Klein, MD;  Location: [place];  Service: General;  Laterality: N/A;  . CARDIOVASCULAR STRESS TEST  06/11/2011   normal nuclear study w/ no ischemia/  normal LV function and wall motion, ef 58%  . CATARACT EXTRACTION W/ INTRAOCULAR LENS  IMPLANT, BILATERAL  2008 approx.  . COLONOSCOPY  last one 03-05-2015  . ESOPHAGOGASTRODUODENOSCOPY  last one 01-06-2011  . INGUINAL HERNIA REPAIR Right 1990's  . MELANOMA EXCISION N/A 12/05/2019   Procedure: WIDE LOCAL EXCISION MELANOMA EXCISION OF SCALP;  Surgeon: Stark Klein, MD;  Location: [place];  Service: General;  Laterality: N/A;  . PENILE BIOPSY N/A 03/12/2017   Procedure: EXCISIONAL GLANS PENILE BIOPSY;  Surgeon: Festus Aloe, MD;  Location: [facility];  Service: Urology;  Laterality: N/A;  . PROSTATE BIOPSY  11/2011   normal  . SKIN FULL THICKNESS GRAFT Left 12/05/2019   Procedure: SKIN GRAFT FULL THICKNESS FROM LEFT UPPER CHEST TO SCALP;  Surgeon: Stark Klein, MD;  Location: [place];  Service: General;  Laterality: Left;  . TONSILLECTOMY  child   Social history  reports that he  has never smoked. He has never used smokeless tobacco. He reports that he does not drink alcohol or use drugs.  No Known Allergies  Family History  Problem Relation Age of Onset  . Coronary artery disease Other   . Heart disease Other        rheumatic  . Colon cancer Neg Hx   . Esophageal cancer Neg Hx   . Rectal cancer Neg Hx   . Stomach cancer Neg Hx     Prior to Admission medications   Medication Sig Start Date End Date Taking? Authorizing Provider  Ascorbic Acid (VITAMIN C) 1000 MG tablet Take 1,000 mg by mouth daily.    Yes [provider]  omeprazole (PRILOSEC) 40 MG capsule TAKE 1 CAPSULE BY MOUTH ONCE DAILY BEFORE BREAKFAST 02/13/20  Yes Gatha Mayer, MD  Turmeric 500 MG CAPS Take 1 capsule by mouth daily.    Yes [provider]  vitamin B-12 (CYANOCOBALAMIN) 500 MCG tablet Take 500 mcg by mouth daily.   Yes [provider]  zinc gluconate 50 MG tablet Take 50 mg by mouth daily.   Yes [provider]    Physical Exam: Vitals:   02/24/20 0703 02/24/20 0953 02/24/20 1156 02/24/20 1304  BP:  127/77 140/77 (!) 148/78  Pulse:  67 80 86  Resp:  18 18 18   Temp:  98.1 F (36.7 C)    TempSrc:  Oral    SpO2:  98% 97% 97%  Weight: 81.6 kg     Height: 5\' 11"  (1.803 m)       Constitutional: NAD, calm, comfortable.  Hard of hearing.  Early male lying on bed.   Vitals:   02/24/20 0703 02/24/20 0953 02/24/20 1156 02/24/20 1304  BP:  127/77 140/77 (!) 148/78  Pulse:  67 80 86  Resp:  18 18 18   Temp:  98.1 F (36.7 C)    TempSrc:  Oral    SpO2:  98% 97% 97%  Weight: 81.6 kg     Height: 5\' 11"  (1.803 m)      Eyes: PERRL, lids and conjunctivae normal ENMT: Mucous membranes are moist. Posterior pharynx clear of any exudate or lesions. Neck: normal, supple, no masses, no thyromegaly Respiratory: bilateral decreased breath sounds at bases, no wheezing, no crackles. Normal respiratory effort. No accessory muscle use.  Cardiovascular: S1 S2 positive, rate controlled. No extremity edema. 2+ pedal pulses.  Abdomen: Very mild epigastric tenderness present; no rebound tenderness, no masses palpated. No hepatosplenomegaly. Bowel sounds positive.  Musculoskeletal: no clubbing / cyanosis. No joint deformity upper and lower extremities.  Skin: no rashes, lesions, ulcers. No induration Neurologic: CN 2-12 grossly intact. Moving extremities. No focal neurologic deficits.  Psychiatric: Normal judgment and insight. Alert and oriented x 3. Normal mood.     Labs on Admission: I have  personally reviewed following labs and imaging studies  CBC: Recent Labs  Lab 02/24/20 0933  WBC 7.4  HGB 14.1  HCT 43.6  MCV 101.2*  PLT [id number]   Basic Metabolic Panel: Recent Labs  Lab 02/24/20 0933  NA 139  K 4.2  CL 107  CO2 21*  GLUCOSE 155*  BUN 17  CREATININE 0.87  CALCIUM 8.8*   GFR: Estimated Creatinine Clearance: 70.9 mL/min (by C-G formula based on SCr of 0.87 mg/dL). Liver Function Tests: Recent Labs  Lab 02/24/20 0933  AST 249*  ALT 289*  ALKPHOS 300*  BILITOT 3.4*  PROT 7.7  ALBUMIN 3.9   Recent Labs  Lab 02/24/20 0933  LIPASE 4,135*   No results for input(s): AMMONIA in the last 168 hours. Coagulation Profile: No results for input(s): INR, PROTIME in the last 168 hours. Cardiac Enzymes: No results for input(s): CKTOTAL, CKMB, CKMBINDEX, TROPONINI in the last 168 hours. BNP (last 3 results) No results for input(s): PROBNP in the last 8760 hours. HbA1C: No results for input(s): HGBA1C in the last 72 hours. CBG: No results for input(s): GLUCAP in the last 168 hours. Lipid Profile: No results for input(s): CHOL, HDL, LDLCALC, TRIG, CHOLHDL, LDLDIRECT in the last 72 hours. Thyroid Function Tests: No results for input(s): TSH, T4TOTAL, FREET4, T3FREE, THYROIDAB in the last 72 hours. Anemia Panel: No results for input(s): VITAMINB12, FOLATE, FERRITIN, TIBC, IRON, RETICCTPCT in the last 72 hours. Urine analysis:    Component Value Date/Time   COLORURINE AMBER (A) 02/24/2020 0933   APPEARANCEUR CLEAR 02/24/2020 0933   LABSPEC 1.010 02/24/2020 0933   PHURINE 5.0 02/24/2020 0933   GLUCOSEU NEGATIVE 02/24/2020 0933   GLUCOSEU NEGATIVE 08/30/2008 0000   HGBUR NEGATIVE 02/24/2020 0933   BILIRUBINUR NEGATIVE 02/24/2020 0933   KETONESUR NEGATIVE 02/24/2020 0933   PROTEINUR NEGATIVE 02/24/2020 0933   UROBILINOGEN 0.2 mg/dL 08/30/2008 0000   NITRITE NEGATIVE 02/24/2020 0933   LEUKOCYTESUR NEGATIVE 02/24/2020 0933    Radiological Exams on  Admission: CT ABDOMEN PELVIS W CONTRAST  Result Date: 02/24/2020 CLINICAL DATA:  Abdominal pain and elevated liver function studies. Nausea and vomiting since last evening. EXAM: CT ABDOMEN AND PELVIS WITH CONTRAST TECHNIQUE: Multidetector CT imaging of the abdomen and pelvis was performed using the standard protocol following bolus administration of intravenous contrast. CONTRAST:  141mL OMNIPAQUE IOHEXOL 300 MG/ML  SOLN COMPARISON:  PET-CT 12/25/2019 FINDINGS: Lower chest: Peripheral interstitial fibrosis and calcified pleural plaques. No infiltrates or effusions. No worrisome pulmonary lesions. The heart is normal in size. No pericardial effusion. Small hiatal hernia. Hepatobiliary: No focal hepatic lesions or intrahepatic biliary dilatation. The gallbladder appears normal. Mild common bile duct dilatation. It measures 12 mm in the porta hepatis and 8.5 mm in the head of the pancreas. No obvious common bile duct stone. Pancreas: Moderate inflammation in and around the pancreas diffusely consistent with acute pancreatitis. No findings suspicious for pancreatic necrosis. No pancreatic ductal dilatation. Spleen: Normal size. No focal lesions. Adrenals/Urinary Tract: Small low-attenuation left adrenal gland lesion consistent with benign adenoma. There is a large right renal cyst. No worrisome renal lesions or hydroureteronephrosis. The bladder is unremarkable. Stomach/Bowel: The stomach, duodenum, small bowel and colon are grossly normal without oral contrast. No acute inflammatory changes, mass lesions or obstructive findings. There is some inflammation in fluid surrounding the second and third portions of the duodenum secondary to the patient's pancreatitis. Colonic diverticulosis is noted without findings for acute diverticulitis. Vascular/Lymphatic: Scattered atherosclerotic calcifications involving the aorta iliac arteries and branch vessels but no aneurysm or dissection. The major venous structures are  patent. No mesenteric or retroperitoneal mass or adenopathy. Reproductive: The prostate gland and seminal vesicles are unremarkable. Other: No free pelvic fluid collections or pelvic adenopathy. No inguinal mass or adenopathy. Musculoskeletal: No significant bony findings. Scoliosis and moderate degenerative changes involving the spine. IMPRESSION: 1. CT findings consistent with acute pancreatitis. No findings suspicious for pancreatic necrosis. 2. Common bile duct dilatation new since February 2021 PET-CT. No obvious obstructing common bile duct stone by CT. MRI/MRCP may be helpful for further evaluation if clinically indicated. 3. Colonic diverticulosis without findings for acute diverticulitis. 4. Small hiatal hernia. 5. Peripheral  interstitial fibrosis and calcified pleural plaques at the lung bases. 6. Aortic atherosclerosis. Aortic Atherosclerosis (ICD10-I70.0). Electronically Signed   By: Marijo Sanes M.D.   On: 02/24/2020 12:10     Assessment/Plan  Acute pancreatitis Elevated LFTs along with CBD dilation -Questionable cause.  Presented with abdominal pain and nausea and CT of the abdomen and pelvis showing acute pancreatitis without necrosis without any biliary stone.  Lipase was found to be 4135 along with elevated LFTs and bilirubin.  CBD was found to be dilated -Spoke to Dr. Joan Flores who will see the patient in consultation and recommends MRCP in a.m. -NPO.  Pain management/antiemetics as needed -IV fluids normal saline at 150 cc an hour -Check lipid profile in a.m.  History of GERD/dysphagia -Was recently seen by Dr. Delrae Rend as an outpatient and barium swallow did not reveal esophageal stricture or ulceration but showed severe gastroesophageal reflux - will use 40 mg IV Protonix once a day for now   DVT prophylaxis: Heparin Code Status: Full Family Communication: Son at bedside Disposition Plan: We will remain inpatient for 2 to 3 days at least pending clinical improvement and  GI input Consults called: GI/Dr. Lyndel Safe Admission status: MedSurg/inpatient  Severity of Illness: The appropriate patient status for this patient is INPATIENT. Inpatient status is judged to be reasonable and necessary in order to provide the required intensity of service to ensure the patient's safety. The patient's presenting symptoms, physical exam findings, and initial radiographic and laboratory data in the context of their chronic comorbidities is felt to place them at high risk for further clinical deterioration. Furthermore, it is not anticipated that the patient will be medically stable for discharge from the hospital within 2 midnights of admission. The following factors support the patient status of inpatient.   " The patient's presenting symptoms include nausea/abdominal pain. " The worrisome physical exam findings include epigastric/right upper quadrant tenderness present " The initial radiographic and laboratory data are worrisome because of acute pancreatitis with CBD dilation and elevated LFTs. " The chronic co-morbidities include GERD/dysphagia.   * I certify that at the point of admission it is my clinical judgment that the patient will require inpatient hospital care spanning beyond 2 midnights from the point of admission due to high intensity of service, high risk for further deterioration and high frequency of surveillance required.Aline August MD [facility]  02/24/2020, 1:45 PM

## 2020-02-24 NOTE — ED Triage Notes (Signed)
Pt c/o nausea started last light. Pt is vomiting in triage

## 2020-02-24 NOTE — ED Provider Notes (Signed)
[facility] DEPT Provider Note   CSN: [id number] Arrival date & time: 02/24/20  7425     History Chief Complaint  Patient presents with  . Nausea    Miguel Thomas is a 79 y.o. male.  The history is provided by the patient and medical records. No language interpreter was used.     79 year old male with history of esophageal stricture, prior cancer, GERD, prostate nodule, presenting to ED for evaluation of abdominal pain.  Patient reports since 7 PM last night he endorsed feeling nauseous.  This morning, he felt increased nausea, did vomit once of nonbloody nonbilious content.  He also endorsed pain across his abdomen radiates to his back.  Described pain as a soreness sensation, moderate in severity, and have been persistent.  He denies any associated fever chills no chest pain or shortness of breath no productive cough, no change in bowel movement no dysuria or rash.  He denies regular NSAID use.  Several days prior he had a swallow study done due to history of dysphagia.  He does not know the result.  His GI specialist is Dr. Carlean Purl.  Patient is hard of hearing.  Patient reported he had COVID-19 in January.  Patient denies alcohol abuse.  Past Medical History:  Diagnosis Date  . Anemia   . Cancer ([place]) 10/2019   melanoma on scalp  . Dyspnea    had positive covid test 11-04-19  . GERD (gastroesophageal reflux disease) 12-12-04   ring like esophageal stricture with reflux esophagitis, also in 2011  . Hiatal hernia   . History of adenomatous polyp of colon    tubular adenom's in 2006;  2011; 03-05-2015  . History of esophageal stricture    s/p  dilatation 2006  and 2012  . Osteopenia   . Penile ulcer   . Plantar fasciitis   . Prostate nodule   . Pseudophakia of both eyes   . Wears hearing aid in both ears     Patient Active Problem List   Diagnosis Date Noted  . Asbestosis ([place]) 01/29/2020  . Plantar fasciitis, left 05/18/2016  . Osteopenia  07/06/2012  . Skin lesion 06/24/2012  . Abnormal EKG 06/02/2011  . OVERWEIGHT 03/07/2010  . ESOPHAGEAL STRICTURE 03/07/2010  . TESTICULAR DISORDER 08/30/2008  . GERD 04/26/2008  . History of colonic polyps 04/26/2008    Past Surgical History:  Procedure Laterality Date  . APPLICATION OF A-CELL OF EXTREMITY N/A 12/05/2019   Procedure: APPLICATION OF A-CELL;  Surgeon: Stark Klein, MD;  Location: [facility];  Service: General;  Laterality: N/A;  . CARDIOVASCULAR STRESS TEST  06/11/2011   normal nuclear study w/ no ischemia/  normal LV function and wall motion, ef 58%  . CATARACT EXTRACTION W/ INTRAOCULAR LENS  IMPLANT, BILATERAL  2008 approx.  . COLONOSCOPY  last one 03-05-2015  . ESOPHAGOGASTRODUODENOSCOPY  last one 01-06-2011  . INGUINAL HERNIA REPAIR Right 1990's  . MELANOMA EXCISION N/A 12/05/2019   Procedure: WIDE LOCAL EXCISION MELANOMA EXCISION OF SCALP;  Surgeon: Stark Klein, MD;  Location: [facility];  Service: General;  Laterality: N/A;  . PENILE BIOPSY N/A 03/12/2017   Procedure: EXCISIONAL GLANS PENILE BIOPSY;  Surgeon: Festus Aloe, MD;  [facility];  Service: Urology;  Laterality: N/A;  . PROSTATE BIOPSY  11/2011   normal  . SKIN FULL THICKNESS GRAFT Left 12/05/2019   Procedure: SKIN GRAFT FULL THICKNESS FROM LEFT UPPER CHEST TO SCALP;  Surgeon: Stark Klein,  MD;  Location: [facility];  Service: General;  Laterality: Left;  . TONSILLECTOMY  child       Family History  Problem Relation Age of Onset  . Coronary artery disease Other   . Heart disease Other        rheumatic  . Colon cancer Neg Hx   . Esophageal cancer Neg Hx   . Rectal cancer Neg Hx   . Stomach cancer Neg Hx     Social History   Tobacco Use  . Smoking status: Never Smoker  . Smokeless tobacco: Never Used  Substance Use Topics  . Alcohol use: No    Alcohol/week: 0.0 standard drinks  . Drug use: No    Home  Medications Prior to Admission medications   Medication Sig Start Date End Date Taking? Authorizing Provider  Ascorbic Acid (VITAMIN C) 1000 MG tablet Take 1,000 mg by mouth daily.    [provider]  omeprazole (PRILOSEC) 40 MG capsule TAKE 1 CAPSULE BY MOUTH ONCE DAILY BEFORE BREAKFAST 02/13/20   Gatha Mayer, MD  Turmeric 500 MG CAPS Take by mouth.    [provider]  vitamin B-12 (CYANOCOBALAMIN) 500 MCG tablet Take 500 mcg by mouth daily.    [provider]  zinc gluconate 50 MG tablet Take 50 mg by mouth daily.    [provider]    Allergies    Patient has no known allergies.  Review of Systems   Review of Systems  All other systems reviewed and are negative.   Physical Exam Updated Vital Signs BP 127/77 (BP Location: Right Arm)   Pulse 67   Temp 98.1 F (36.7 C) (Oral)   Resp 18   Ht '5\' 11"'$  (1.803 m)   Wt 81.6 kg   SpO2 98%   BMI 25.10 kg/m   Physical Exam Vitals and nursing note reviewed.  Constitutional:      General: He is not in acute distress.    Appearance: He is well-developed.     Comments: Elderly male well-appearing in no acute discomfort.  HENT:     Head: Atraumatic.  Eyes:     Conjunctiva/sclera: Conjunctivae normal.  Cardiovascular:     Rate and Rhythm: Normal rate and regular rhythm.     Pulses: Normal pulses.     Heart sounds: Normal heart sounds.  Pulmonary:     Effort: Pulmonary effort is normal.     Breath sounds: Normal breath sounds. No wheezing or rales.  Abdominal:     General: Abdomen is flat.     Palpations: Abdomen is soft.     Tenderness: There is no abdominal tenderness.     Comments: No significant abdominal tenderness on palpation.  Negative Murphy sign, no pain at McBurney's point.  Musculoskeletal:     Cervical back: Neck supple.  Skin:    Findings: No rash.  Neurological:     Mental Status: He is alert. Mental status is at baseline.  Psychiatric:        Mood and Affect: Mood  normal.     ED Results / Procedures / Treatments   Labs (all labs ordered are listed, but only abnormal results are displayed) Labs Reviewed  LIPASE, BLOOD - Abnormal; Notable for the following components:      Result Value   Lipase 4,135 (*)    All other components within normal limits  COMPREHENSIVE METABOLIC PANEL - Abnormal; Notable for the following components:   CO2 21 (*)  Glucose, Bld 155 (*)    Calcium 8.8 (*)    AST 249 (*)    ALT 289 (*)    Alkaline Phosphatase 300 (*)    Total Bilirubin 3.4 (*)    All other components within normal limits  CBC - Abnormal; Notable for the following components:   MCV 101.2 (*)    All other components within normal limits  URINALYSIS, ROUTINE W REFLEX MICROSCOPIC - Abnormal; Notable for the following components:   Color, Urine AMBER (*)    All other components within normal limits  RESPIRATORY PANEL BY RT PCR (FLU A&B, COVID)  CREATININE, SERUM    EKG None  Radiology CT ABDOMEN PELVIS W CONTRAST  Result Date: 02/24/2020 CLINICAL DATA:  Abdominal pain and elevated liver function studies. Nausea and vomiting since last evening. EXAM: CT ABDOMEN AND PELVIS WITH CONTRAST TECHNIQUE: Multidetector CT imaging of the abdomen and pelvis was performed using the standard protocol following bolus administration of intravenous contrast. CONTRAST:  134m OMNIPAQUE IOHEXOL 300 MG/ML  SOLN COMPARISON:  PET-CT 12/25/2019 FINDINGS: Lower chest: Peripheral interstitial fibrosis and calcified pleural plaques. No infiltrates or effusions. No worrisome pulmonary lesions. The heart is normal in size. No pericardial effusion. Small hiatal hernia. Hepatobiliary: No focal hepatic lesions or intrahepatic biliary dilatation. The gallbladder appears normal. Mild common bile duct dilatation. It measures 12 mm in the porta hepatis and 8.5 mm in the head of the pancreas. No obvious common bile duct stone. Pancreas: Moderate inflammation in and around the pancreas  diffusely consistent with acute pancreatitis. No findings suspicious for pancreatic necrosis. No pancreatic ductal dilatation. Spleen: Normal size. No focal lesions. Adrenals/Urinary Tract: Small low-attenuation left adrenal gland lesion consistent with benign adenoma. There is a large right renal cyst. No worrisome renal lesions or hydroureteronephrosis. The bladder is unremarkable. Stomach/Bowel: The stomach, duodenum, small bowel and colon are grossly normal without oral contrast. No acute inflammatory changes, mass lesions or obstructive findings. There is some inflammation in fluid surrounding the second and third portions of the duodenum secondary to the patient's pancreatitis. Colonic diverticulosis is noted without findings for acute diverticulitis. Vascular/Lymphatic: Scattered atherosclerotic calcifications involving the aorta iliac arteries and branch vessels but no aneurysm or dissection. The major venous structures are patent. No mesenteric or retroperitoneal mass or adenopathy. Reproductive: The prostate gland and seminal vesicles are unremarkable. Other: No free pelvic fluid collections or pelvic adenopathy. No inguinal mass or adenopathy. Musculoskeletal: No significant bony findings. Scoliosis and moderate degenerative changes involving the spine. IMPRESSION: 1. CT findings consistent with acute pancreatitis. No findings suspicious for pancreatic necrosis. 2. Common bile duct dilatation new since February 2021 PET-CT. No obvious obstructing common bile duct stone by CT. MRI/MRCP may be helpful for further evaluation if clinically indicated. 3. Colonic diverticulosis without findings for acute diverticulitis. 4. Small hiatal hernia. 5. Peripheral interstitial fibrosis and calcified pleural plaques at the lung bases. 6. Aortic atherosclerosis. Aortic Atherosclerosis (ICD10-I70.0). Electronically Signed   By: PMarijo SanesM.D.   On: 02/24/2020 12:10    Procedures Procedures (including critical  care time)  Medications Ordered in ED Medications  heparin injection 5,000 Units (5,000 Units Subcutaneous Given 02/24/20 1502)  0.9 %  sodium chloride infusion ( Intravenous New Bag/Given 02/24/20 1501)  morphine 2 MG/ML injection 2 mg (has no administration in time range)  ondansetron (ZOFRAN) tablet 4 mg (has no administration in time range)    Or  ondansetron (ZOFRAN) injection 4 mg (has no administration in time range)  pantoprazole (PROTONIX)  injection 40 mg (40 mg Intravenous Given 02/24/20 1502)  sodium chloride flush (NS) 0.9 % injection 3 mL ( Intravenous Given 02/24/20 1503)  ondansetron (ZOFRAN-ODT) disintegrating tablet 4 mg (4 mg Oral Given 02/24/20 0953)  iohexol (OMNIPAQUE) 300 MG/ML solution 100 mL (100 mLs Intravenous Contrast Given 02/24/20 1115)  sodium chloride 0.9 % bolus 1,000 mL (1,000 mLs Intravenous New Bag/Given 02/24/20 1300)    ED Course  I have reviewed the triage vital signs and the nursing notes.  Pertinent labs & imaging results that were available during my care of the patient were reviewed by me and considered in my medical decision making (see chart for details).    MDM Rules/Calculators/A&P                      BP (!) 148/78   Pulse 86   Temp 98.1 F (36.7 C) (Oral)   Resp 18   Ht '5\' 11"'$  (1.803 m)   Wt 81.6 kg   SpO2 97%   BMI 25.10 kg/m   Final Clinical Impression(s) / ED Diagnoses Final diagnoses:  Acute pancreatitis without infection or necrosis, unspecified pancreatitis type    Rx / DC Orders ED Discharge Orders    None     11:06 AM Patient here with complaints of diffuse abdominal discomfort as well as nausea vomiting that started since last night.  He has a fairly benign abdominal exam however labs remarkable for transaminitis with AST two forty-nine, ALT two eighty-nine, alk phos three hundred, and total bili of 3.4.  This could indicate an obstructive pathology of the gallbladder.  Will obtain abdominal pelvic CT scan, if negative,  will consider ultrasounds of the gallbladder.  Lipase is currently pending.  1:22 PM Lipase is markedly elevated at 4135.  An abdominal and pelvic CT scan was obtained with CT finding consistent with acute pancreatitis.  No findings suspicious for pancreatic necrosis.  Common bile duct dilatation (8.17m-12mm) which is new compared to February 2021.  No obvious obstructive common bile duct stone by CT.  MRI/MRCP may be helpful for further evaluation if clinically indicated.  Patient made n.p.o., will obtain screening COVID-19 test, will consult for admission.  We will also consult GI specialist for further management. Care discussed with Dr. PAlvino Chapel NO fever to suggest cholangitis.  1:36 PM Appreciate consultation from [facility] Dr. AStarla Link who agrees to admit pt.  He request GI consultation and to order MRCP if requested.   3:04 PM I finally able to consult GI specialist Dr. GLyndel Safewho state he has spoke with Dr. AStarla Linkalready.  He agrees with MRCP tomorrow.   MBRAHEEM TOMASIKwas evaluated in Emergency Department on 02/24/2020 for the symptoms described in the history of present illness. He was evaluated in the context of the global COVID-19 pandemic, which necessitated consideration that the patient might be at risk for infection with the SARS-CoV-2 virus that causes COVID-19. Institutional protocols and algorithms that pertain to the evaluation of patients at risk for COVID-19 are in a state of rapid change based on information released by regulatory bodies including the CDC and federal and state organizations. These policies and algorithms were followed during the patient's care in the ED.    TDomenic Moras PA-C 02/24/20 1505    PDavonna Belling MD 02/25/20 1451

## 2020-02-24 NOTE — Progress Notes (Signed)
Called to get report on the patient. Will have receiving RN follow up.

## 2020-02-25 ENCOUNTER — Inpatient Hospital Stay (HOSPITAL_COMMUNITY): Payer: Medicare Other

## 2020-02-25 DIAGNOSIS — R7989 Other specified abnormal findings of blood chemistry: Secondary | ICD-10-CM

## 2020-02-25 DIAGNOSIS — K219 Gastro-esophageal reflux disease without esophagitis: Secondary | ICD-10-CM

## 2020-02-25 LAB — CBC
HCT: 36.3 % — ABNORMAL LOW (ref 39.0–52.0)
Hemoglobin: 11.7 g/dL — ABNORMAL LOW (ref 13.0–17.0)
MCH: 32.5 pg (ref 26.0–34.0)
MCHC: 32.2 g/dL (ref 30.0–36.0)
MCV: 100.8 fL — ABNORMAL HIGH (ref 80.0–100.0)
Platelets: 165 10*3/uL (ref 150–400)
RBC: 3.6 MIL/uL — ABNORMAL LOW (ref 4.22–5.81)
RDW: 14.2 % (ref 11.5–15.5)
WBC: 7.2 10*3/uL (ref 4.0–10.5)
nRBC: 0 % (ref 0.0–0.2)

## 2020-02-25 LAB — LIPASE, BLOOD: Lipase: 322 U/L — ABNORMAL HIGH (ref 11–51)

## 2020-02-25 LAB — COMPREHENSIVE METABOLIC PANEL
ALT: 206 U/L — ABNORMAL HIGH (ref 0–44)
AST: 130 U/L — ABNORMAL HIGH (ref 15–41)
Albumin: 3.1 g/dL — ABNORMAL LOW (ref 3.5–5.0)
Alkaline Phosphatase: 271 U/L — ABNORMAL HIGH (ref 38–126)
Anion gap: 6 (ref 5–15)
BUN: 17 mg/dL (ref 8–23)
CO2: 25 mmol/L (ref 22–32)
Calcium: 8.3 mg/dL — ABNORMAL LOW (ref 8.9–10.3)
Chloride: 110 mmol/L (ref 98–111)
Creatinine, Ser: 0.61 mg/dL (ref 0.61–1.24)
GFR calc Af Amer: >60 mL/min (ref >60)
GFR calc non Af Amer: >60 mL/min (ref >60)
Glucose, Bld: 89 mg/dL (ref 70–99)
Potassium: 3.6 mmol/L (ref 3.5–5.1)
Sodium: 141 mmol/L (ref 135–145)
Total Bilirubin: 3.3 mg/dL — ABNORMAL HIGH (ref 0.3–1.2)
Total Protein: 6.6 g/dL (ref 6.5–8.1)

## 2020-02-25 LAB — LIPID PANEL
Cholesterol: 190 mg/dL (ref 0–200)
HDL: 69 mg/dL (ref >40)
LDL Cholesterol: 111 mg/dL — ABNORMAL HIGH (ref 0–99)
Total CHOL/HDL Ratio: 2.8 RATIO
Triglycerides: 49 mg/dL (ref <150)
VLDL: 10 mg/dL (ref 0–40)

## 2020-02-25 LAB — GLUCOSE, CAPILLARY
Glucose-Capillary: 64 mg/dL — ABNORMAL LOW (ref 70–99)
Glucose-Capillary: 77 mg/dL (ref 70–99)
Glucose-Capillary: 78 mg/dL (ref 70–99)
Glucose-Capillary: 81 mg/dL (ref 70–99)

## 2020-02-25 LAB — MAGNESIUM: Magnesium: 2 mg/dL (ref 1.7–2.4)

## 2020-02-25 MED ORDER — GADOBUTROL 1 MMOL/ML IV SOLN
8.0000 mL | Freq: Once | INTRAVENOUS | Status: AC | PRN
  Administered 2020-02-25: 8 mL via INTRAVENOUS

## 2020-02-25 MED ORDER — CIPROFLOXACIN IN D5W 400 MG/200ML IV SOLN
400.0000 mg | Freq: Two times a day (BID) | INTRAVENOUS | Status: DC
  Administered 2020-02-25 – 2020-02-27 (×4): 400 mg via INTRAVENOUS
  Filled 2020-02-25 (×4): qty 200

## 2020-02-25 NOTE — Progress Notes (Signed)
PROGRESS NOTE  Miguel Thomas DOB: 09-17-1941 DOA: 02/24/2020 PCP: [name], NP  Brief History   Miguel Thomas is a 79 y.o. male with medical history significant of GERD/chronic reflux/dysphagia/melanoma scalp, COVID-19 in January 2021 presented with nausea and abdominal pain.  Patient states that he has been nauseous since 7 PM last night.  This morning, he felt that his nausea had increased, did vomit once but it was nonbloody and nonbilious.  He also complained of some upper abdominal pain radiating to back, sharp in nature, almost constant currently, 6-7 out of 10 in intensity with no aggravating or relieving factors.  He denies any fever, chills, chest pain, shortness of breath, cough, change in bowel habits, dysuria.  He was recently seen by Dr. Delrae Rend for dysphagia.  ED Course: He was found to have elevated LFTs along with lipase of 4135.  CT of the abdomen and pelvis showed findings consistent with acute pancreatitis without necrosis along with common bile duct dilatation 8.5 mm to 12 mm which is new compared to the February 2021 but no common bile duct stone.  GI has been paged by ED provider.  Hospitalist service was called to evaluate the patient  Plan is for MRCP today. He is receiving IV cipro.  Consultants  . Gastroenterology  Procedures  . MRCP  Antibiotics   Anti-infectives (From admission, onward)   Start     Dose/Rate Route Frequency Ordered Stop   02/25/20 1800  ciprofloxacin (CIPRO) IVPB 400 mg     400 mg 200 mL/hr over 60 Minutes Intravenous Every 12 hours 02/25/20 1746      .  Subjective  The patient is resting. He states that his abdominal pain is somewhat better. No new complaints. He is asking about a diet.  Objective   Vitals:  Vitals:   02/25/20 0528 02/25/20 1412  BP: 127/70 133/72  Pulse: 81 80  Resp: 18 16  Temp: 97.7 F (36.5 C) 98.2 F (36.8 C)  SpO2: 95% 100%  Exam:  Constitutional:  . The patient is awake,  alert, and oriented x 3. No acute distress. Respiratory:  . No increased work of breathing. . No wheezes, rales, or rhonchi . No tactile fremitus Cardiovascular:  . Regular rate and rhythm . No murmurs, ectopy, or gallups. . No lateral PMI. No thrills. Abdomen:  . Abdomen is soft, somewhat tender in the epigastrum, mildly distended . No hernias, masses, or organomegaly . Hypooactive bowel sounds.  Musculoskeletal:  . No cyanosis, clubbing, or edema Skin:  . No rashes, lesions, ulcers . palpation of skin: no induration or nodules Neurologic:  . CN 2-12 intact . Sensation all 4 extremities intact Psychiatric:  . Mental status o Mood, affect appropriate o Orientation to person, place, time  . judgment and insight appear intact  I have personally reviewed the following:   Today's Data  . Vitals, CMP, CBC  Imaging  . MRCP: 13 gallstones 3-4 mm blocking CBD. Not radioopaque. There is also diffuse inflammatory stranding about the pancreas and adjacent retroperitoneum consistent with gallstone pancreatitis. No necrosis of acute pancreatic fluid collection.   Scheduled Meds: . heparin  5,000 Units Subcutaneous Q8H  . pantoprazole (PROTONIX) IV  40 mg Intravenous Q24H   Continuous Infusions: . sodium chloride 150 mL/hr at 02/25/20 1027  . ciprofloxacin      Principal Problem:   Acute pancreatitis Active Problems:   GERD   Elevated LFTs   LOS: 1 day   A & P  Acute gallstone pancreatitis with obstruction of CBD with gallstones: Elevated LFT's. There is no ascending cholangitis. Plan is for ERCP in am with Dr. Henrene Pastor. He will require lap chole thereafter. Continue IV Cipro and protonix. He is on a clear liquid diet after MRCP today. He will be NPO after midnight. Continue IV fluids. Triglycerides are normal on lipid panel. Pain management/antiemetics as needed.  History of GERD/dysphagia: The patient is on IV protonix. The patient was recently seen by Dr. Delrae Rend as an  outpatient and barium swallow did not reveal esophageal stricture or ulceration but showed severe gastroesophageal reflux.   I have seen and examined this patient myself. I have spent 35 minutes in his evaluation and care.  DVT prophylaxis: Heparin Code Status: Full Family Communication: Son at bedside Disposition Plan: The patient is admitted to inpatient from home. It is anticipated that he will discharge to home when medically cleared. Barriers to discharge include resolution of gallstone pancreatitis by ERCP and laparoscopic cholecystectomy.  Sharde Gover, DO [facility] Direct contact: see [email or web address]  7PM-7AM contact night coverage as above 02/25/2020, 7:32 PM  LOS: 1 day

## 2020-02-25 NOTE — Consult Note (Signed)
Chief Complaint: abdo pain  Referring Provider:  No ref. provider found      ASSESSMENT AND PLAN;   #1.  Acute biliary pancreatitis.  MRCP today showing multiple CBD stones.  Also with cholelithiasis.  No ascending cholangitis.  #2. GERD with smahh HH, occ dysphagia. No eso stricture on recent Ba swallow 02/16/2020.  Plan: -Add lipase to blood draw today. -Check CBC, CMP and lipase in a.m. -Would schedule him for ERCP in a.m. with Dr. Henrene Pastor.  I have explained risks and benefits including risks of pancreatitis, bleeding, perforation.  -Will need lap chole thereafter. -IV antibiotics -Continue Protonix. -Clear liquid diet.  N.p.o. after midnight   HPI:    Miguel Thomas is a 79 y.o. male   Very HOH  With history of chronic GERD with occasional dysphagia, COVID-21 Nov 2019 admitted with nausea/vomiting and postprandial abdominal pain.  No fever chills night sweats or jaundice.  He was found to have elevated lipase and liver function tests.  CT showed acute pancreatitis without necrosis.  CBD was dilated.  He underwent MRCP today which showed multiple stacked CBD stones along with cholelithiasis. No PD dilatation or any fluid collection.  Abdominal pain is better on morphine as needed.  Nausea is better with as needed Zofran.  He is also on IV Protonix.  No history of alcohol use.  No previous history of pancreatitis.   Past Medical History:  Diagnosis Date  . Anemia   . Cancer ([place]) 10/2019   melanoma on scalp  . Dyspnea    had positive covid test 11-04-19  . GERD (gastroesophageal reflux disease) 12-12-04   ring like esophageal stricture with reflux esophagitis, also in 2011  . Hiatal hernia   . History of adenomatous polyp of colon    tubular adenom's in 2006;  2011; 03-05-2015  . History of esophageal stricture    s/p  dilatation 2006  and 2012  . Osteopenia   . Penile ulcer   . Plantar fasciitis   . Prostate nodule   . Pseudophakia of both eyes   . Wears  hearing aid in both ears     Past Surgical History:  Procedure Laterality Date  . APPLICATION OF A-CELL OF EXTREMITY N/A 12/05/2019   Procedure: APPLICATION OF A-CELL;  Surgeon: Stark Klein, MD;  Location: [facility];  Service: General;  Laterality: N/A;  . CARDIOVASCULAR STRESS TEST  06/11/2011   normal nuclear study w/ no ischemia/  normal LV function and wall motion, ef 58%  . CATARACT EXTRACTION W/ INTRAOCULAR LENS  IMPLANT, BILATERAL  2008 approx.  . COLONOSCOPY  last one 03-05-2015  . ESOPHAGOGASTRODUODENOSCOPY  last one 01-06-2011  . INGUINAL HERNIA REPAIR Right 1990's  . MELANOMA EXCISION N/A 12/05/2019   Procedure: WIDE LOCAL EXCISION MELANOMA EXCISION OF SCALP;  Surgeon: Stark Klein, MD;  Location: [facility];  Service: General;  Laterality: N/A;  . PENILE BIOPSY N/A 03/12/2017   Procedure: EXCISIONAL GLANS PENILE BIOPSY;  Surgeon: Festus Aloe, MD;  Location: [facility];  Service: Urology;  Laterality: N/A;  . PROSTATE BIOPSY  11/2011   normal  . SKIN FULL THICKNESS GRAFT Left 12/05/2019   Procedure: SKIN GRAFT FULL THICKNESS FROM LEFT UPPER CHEST TO SCALP;  Surgeon: Stark Klein, MD;  Location: [facility];  Service: General;  Laterality: Left;  . TONSILLECTOMY  child    Family History  Problem Relation Age of Onset  . Coronary artery disease Other   .  Heart disease Other        rheumatic  . Colon cancer Neg Hx   . Esophageal cancer Neg Hx   . Rectal cancer Neg Hx   . Stomach cancer Neg Hx     Social History   Tobacco Use  . Smoking status: Never Smoker  . Smokeless tobacco: Never Used  Substance Use Topics  . Alcohol use: No    Alcohol/week: 0.0 standard drinks  . Drug use: No    Current Facility-Administered Medications  Medication Dose Route Frequency Provider Last Rate Last Admin  . 0.9 %  sodium chloride infusion   Intravenous Continuous Aline August, MD 150 mL/hr at 02/25/20 1027 New Bag  at 02/25/20 1027  . ciprofloxacin (CIPRO) IVPB 400 mg  400 mg Intravenous Q12H Jackquline Denmark, MD      . heparin injection 5,000 Units  5,000 Units Subcutaneous Q8H Aline August, MD   5,000 Units at 02/25/20 1702  . morphine 2 MG/ML injection 2 mg  2 mg Intravenous Q2H PRN Aline August, MD   2 mg at 02/24/20 1929  . ondansetron (ZOFRAN) tablet 4 mg  4 mg Oral Q6H PRN Aline August, MD       Or  . ondansetron (ZOFRAN) injection 4 mg  4 mg Intravenous Q6H PRN Alekh, Kshitiz, MD      . pantoprazole (PROTONIX) injection 40 mg  40 mg Intravenous Q24H Alekh, Kshitiz, MD   40 mg at 02/25/20 1702    No Known Allergies  Review of Systems:  Constitutional: Denies fever, chills, diaphoresis, appetite change and fatigue.  HEENT: Denies photophobia, eye pain, redness, hearing loss, ear pain, congestion, sore throat, rhinorrhea, sneezing, mouth sores, neck pain, neck stiffness and tinnitus.   Respiratory: Denies SOB, DOE, cough, chest tightness,  and wheezing.   Cardiovascular: Denies chest pain, palpitations and leg swelling.  Genitourinary: Denies dysuria, urgency, frequency, hematuria, flank pain and difficulty urinating.  Musculoskeletal: Denies myalgias, back pain, joint swelling, arthralgias and gait problem.  Skin: No rash.  Neurological: Denies dizziness, seizures, syncope, weakness, light-headedness, numbness and headaches.  Hematological: Denies adenopathy. Easy bruising, personal or family bleeding history  Psychiatric/Behavioral: No anxiety or depression     Physical Exam:    BP 133/72 (BP Location: Right Arm)   Pulse 80   Temp 98.2 F (36.8 C) (Oral)   Resp 16   Ht 5\' 11"  (1.803 m)   Wt 81.6 kg   SpO2 100%   BMI 25.10 kg/m  Wt Readings from Last 3 Encounters:  02/24/20 81.6 kg  02/13/20 81.6 kg  01/29/20 83.5 kg   Constitutional:  Well-developed, in no acute distress. Psychiatric: Normal mood and affect. Behavior is normal. HEENT: Pupils normal.  Conjunctivae are  normal. No scleral icterus. Neck supple.  Cardiovascular: Normal rate, regular rhythm. No edema Pulmonary/chest: Effort normal and breath sounds normal. No wheezing, rales or rhonchi. Abdominal: Soft, nondistended. Nontender. Bowel sounds active throughout. There are no masses palpable. No hepatomegaly. Rectal:  defered Neurological: Alert and oriented to person place and time. Skin: Skin is warm and dry. No rashes noted.  Data Reviewed: I have personally reviewed following labs and imaging studies  CBC: CBC Latest Ref Rng & Units 02/25/2020 02/24/2020 11/29/2019  WBC 4.0 - 10.5 K/uL 7.2 7.4 5.0  Hemoglobin 13.0 - 17.0 g/dL 11.7(L) 14.1 11.8(L)  Hematocrit 39.0 - 52.0 % 36.3(L) 43.6 35.7(L)  Platelets 150 - 400 K/uL 165 220 192    CMP: CMP Latest Ref Rng & Units  02/25/2020 02/24/2020 02/24/2020  Glucose 70 - 99 mg/dL 89 - 155(H)  BUN 8 - 23 mg/dL 17 - 17  Creatinine 0.61 - 1.24 mg/dL 0.61 0.81 0.87  Sodium 135 - 145 mmol/L 141 - 139  Potassium 3.5 - 5.1 mmol/L 3.6 - 4.2  Chloride 98 - 111 mmol/L 110 - 107  CO2 22 - 32 mmol/L 25 - 21(L)  Calcium 8.9 - 10.3 mg/dL 8.3(L) - 8.8(L)  Total Protein 6.5 - 8.1 g/dL 6.6 - 7.7  Total Bilirubin 0.3 - 1.2 mg/dL 3.3(H) - 3.4(H)  Alkaline Phos 38 - 126 U/L 271(H) - 300(H)  AST 15 - 41 U/L 130(H) - 249(H)  ALT 0 - 44 U/L 206(H) - 289(H)    GFR: Estimated Creatinine Clearance: 77.1 mL/min (by C-G formula based on SCr of 0.61 mg/dL). Liver Function Tests: Recent Labs  Lab 02/24/20 0933 02/25/20 0556  AST 249* 130*  ALT 289* 206*  ALKPHOS 300* 271*  BILITOT 3.4* 3.3*  PROT 7.7 6.6  ALBUMIN 3.9 3.1*   Recent Labs  Lab 02/24/20 0933  LIPASE 4,135*   No results for input(s): AMMONIA in the last 168 hours. Coagulation Profile: No results for input(s): INR, PROTIME in the last 168 hours. HbA1C: No results for input(s): HGBA1C in the last 72 hours. Lipid Profile: Recent Labs    02/25/20 0556  CHOL 190  HDL 69  LDLCALC 111*  TRIG  49  CHOLHDL 2.8   Thyroid Function Tests: No results for input(s): TSH, T4TOTAL, FREET4, T3FREE, THYROIDAB in the last 72 hours. Anemia Panel: No results for input(s): VITAMINB12, FOLATE, FERRITIN, TIBC, IRON, RETICCTPCT in the last 72 hours.  Recent Results (from the past 240 hour(s))  Respiratory Panel by RT PCR (Flu A&B, Covid) - Nasopharyngeal Swab     Status: None   Collection Time: 02/24/20  1:27 PM   Specimen: Nasopharyngeal Swab  Result Value Ref Range Status   SARS Coronavirus 2 by RT PCR NEGATIVE NEGATIVE Final    Comment: (NOTE) SARS-CoV-2 target nucleic acids are NOT DETECTED. The SARS-CoV-2 RNA is generally detectable in upper respiratoy specimens during the acute phase of infection. The lowest concentration of SARS-CoV-2 viral copies this assay can detect is 131 copies/mL. A negative result does not preclude SARS-Cov-2 infection and should not be used as the sole basis for treatment or other patient management decisions. A negative result may occur with  improper specimen collection/handling, submission of specimen other than nasopharyngeal swab, presence of viral mutation(s) within the areas targeted by this assay, and inadequate number of viral copies (<131 copies/mL). A negative result must be combined with clinical observations, patient history, and epidemiological information. The expected result is Negative. Fact Sheet for Patients:  PinkCheek.be Fact Sheet for Healthcare Providers:  GravelBags.it This test is not yet ap proved or cleared by the [place] FDA and  has been authorized for detection and/or diagnosis of SARS-CoV-2 by FDA under an Emergency Use Authorization (EUA). This EUA will remain  in effect (meaning this test can be used) for the duration of the COVID-19 declaration under Section 564(b)(1) of the Act, 21 U.S.C. section 360bbb-3(b)(1), unless the authorization is terminated  or revoked sooner.    Influenza A by PCR NEGATIVE NEGATIVE Final   Influenza B by PCR NEGATIVE NEGATIVE Final    Comment: (NOTE) The Xpert Xpress SARS-CoV-2/FLU/RSV assay is intended as an aid in  the diagnosis of influenza from Nasopharyngeal swab specimens and  should not be used as a sole basis  for treatment. Nasal washings and  aspirates are unacceptable for Xpert Xpress SARS-CoV-2/FLU/RSV  testing. Fact Sheet for Patients: PinkCheek.be Fact Sheet for Healthcare Providers: GravelBags.it This test is not yet approved or cleared by the [place] FDA and  has been authorized for detection and/or diagnosis of SARS-CoV-2 by  FDA under an Emergency Use Authorization (EUA). This EUA will remain  in effect (meaning this test can be used) for the duration of the  Covid-19 declaration under Section 564(b)(1) of the Act, 21  U.S.C. section 360bbb-3(b)(1), unless the authorization is  terminated or revoked. Performed at [facility], [place] [address], [place], [place] [address]       Radiology Studies: CT ABDOMEN PELVIS W CONTRAST  Result Date: 02/24/2020 CLINICAL DATA:  Abdominal pain and elevated liver function studies. Nausea and vomiting since last evening. EXAM: CT ABDOMEN AND PELVIS WITH CONTRAST TECHNIQUE: Multidetector CT imaging of the abdomen and pelvis was performed using the standard protocol following bolus administration of intravenous contrast. CONTRAST:  174mL OMNIPAQUE IOHEXOL 300 MG/ML  SOLN COMPARISON:  PET-CT 12/25/2019 FINDINGS: Lower chest: Peripheral interstitial fibrosis and calcified pleural plaques. No infiltrates or effusions. No worrisome pulmonary lesions. The heart is normal in size. No pericardial effusion. Small hiatal hernia. Hepatobiliary: No focal hepatic lesions or intrahepatic biliary dilatation. The gallbladder appears normal. Mild common bile duct dilatation. It measures 12 mm  in the porta hepatis and 8.5 mm in the head of the pancreas. No obvious common bile duct stone. Pancreas: Moderate inflammation in and around the pancreas diffusely consistent with acute pancreatitis. No findings suspicious for pancreatic necrosis. No pancreatic ductal dilatation. Spleen: Normal size. No focal lesions. Adrenals/Urinary Tract: Small low-attenuation left adrenal gland lesion consistent with benign adenoma. There is a large right renal cyst. No worrisome renal lesions or hydroureteronephrosis. The bladder is unremarkable. Stomach/Bowel: The stomach, duodenum, small bowel and colon are grossly normal without oral contrast. No acute inflammatory changes, mass lesions or obstructive findings. There is some inflammation in fluid surrounding the second and third portions of the duodenum secondary to the patient's pancreatitis. Colonic diverticulosis is noted without findings for acute diverticulitis. Vascular/Lymphatic: Scattered atherosclerotic calcifications involving the aorta iliac arteries and branch vessels but no aneurysm or dissection. The major venous structures are patent. No mesenteric or retroperitoneal mass or adenopathy. Reproductive: The prostate gland and seminal vesicles are unremarkable. Other: No free pelvic fluid collections or pelvic adenopathy. No inguinal mass or adenopathy. Musculoskeletal: No significant bony findings. Scoliosis and moderate degenerative changes involving the spine. IMPRESSION: 1. CT findings consistent with acute pancreatitis. No findings suspicious for pancreatic necrosis. 2. Common bile duct dilatation new since February 2021 PET-CT. No obvious obstructing common bile duct stone by CT. MRI/MRCP may be helpful for further evaluation if clinically indicated. 3. Colonic diverticulosis without findings for acute diverticulitis. 4. Small hiatal hernia. 5. Peripheral interstitial fibrosis and calcified pleural plaques at the lung bases. 6. Aortic atherosclerosis.  Aortic Atherosclerosis (ICD10-I70.0). Electronically Signed   By: Marijo Sanes M.D.   On: 02/24/2020 12:10   MR 3D Recon At Scanner  Result Date: 02/25/2020 CLINICAL DATA:  Pancreatitis suspected, dilated CBD, elevated LFTs, history of scalp melanoma EXAM: MRI ABDOMEN WITH CONTRAST (WITH MRCP) TECHNIQUE: Multiplanar multisequence MR imaging of the abdomen was performed following the administration of intravenous contrast. Heavily T2-weighted images of the biliary and pancreatic ducts were obtained, and three-dimensional MRCP images were rendered by post processing. CONTRAST:  64mL GADAVIST GADOBUTROL 1 MMOL/ML IV SOLN COMPARISON:  CT abdomen pelvis, 02/24/2020, PET-CT, 12/25/2019 FINDINGS: Examination is generally limited by breath motion artifact. Lower chest: Trace bilateral pleural effusions. Hepatobiliary: No mass or other parenchymal abnormality identified. Numerous tiny gallstones in the gallbladder. There are approximately 13 tiny gallstones measuring 3-4 mm stacked in the common bile duct to the ampulla (series 10, image 22). The common bile duct is mildly enlarged measuring up to 8 mm. Pancreas: Diffuse inflammatory stranding about the pancreas and adjacent retroperitoneum. No pancreatic ductal dilatation. No evidence of parenchymal hypoenhancement or fluid collection. Spleen:  Within normal limits in size and appearance. Adrenals/Urinary Tract: No masses identified. Exophytic fluid signal cyst of the right kidney. No evidence of hydronephrosis. Stomach/Bowel: Visualized portions within the abdomen are unremarkable. Vascular/Lymphatic: No pathologically enlarged lymph nodes identified. No abdominal aortic aneurysm demonstrated. Other:  None. Musculoskeletal: No suspicious bone lesions identified. IMPRESSION: 1. Numerous tiny gallstones in the gallbladder and approximately 13 gallstones measuring 3-4 mm mm stacked in the common bile duct to the ampulla. These calculi were not radiopaque by prior CT. 2.  Diffuse inflammatory stranding about the pancreas and adjacent retroperitoneum, consistent with gallstone pancreatitis. No pancreatic ductal dilatation. No evidence of pancreatic parenchymal necrosis or acute pancreatic fluid collection. Electronically Signed   By: Eddie Candle M.D.   On: 02/25/2020 14:56   MR ABDOMEN WITH MRCP W CONTRAST  Result Date: 02/25/2020 CLINICAL DATA:  Pancreatitis suspected, dilated CBD, elevated LFTs, history of scalp melanoma EXAM: MRI ABDOMEN WITH CONTRAST (WITH MRCP) TECHNIQUE: Multiplanar multisequence MR imaging of the abdomen was performed following the administration of intravenous contrast. Heavily T2-weighted images of the biliary and pancreatic ducts were obtained, and three-dimensional MRCP images were rendered by post processing. CONTRAST:  43mL GADAVIST GADOBUTROL 1 MMOL/ML IV SOLN COMPARISON:  CT abdomen pelvis, 02/24/2020, PET-CT, 12/25/2019 FINDINGS: Examination is generally limited by breath motion artifact. Lower chest: Trace bilateral pleural effusions. Hepatobiliary: No mass or other parenchymal abnormality identified. Numerous tiny gallstones in the gallbladder. There are approximately 13 tiny gallstones measuring 3-4 mm stacked in the common bile duct to the ampulla (series 10, image 22). The common bile duct is mildly enlarged measuring up to 8 mm. Pancreas: Diffuse inflammatory stranding about the pancreas and adjacent retroperitoneum. No pancreatic ductal dilatation. No evidence of parenchymal hypoenhancement or fluid collection. Spleen:  Within normal limits in size and appearance. Adrenals/Urinary Tract: No masses identified. Exophytic fluid signal cyst of the right kidney. No evidence of hydronephrosis. Stomach/Bowel: Visualized portions within the abdomen are unremarkable. Vascular/Lymphatic: No pathologically enlarged lymph nodes identified. No abdominal aortic aneurysm demonstrated. Other:  None. Musculoskeletal: No suspicious bone lesions identified.  IMPRESSION: 1. Numerous tiny gallstones in the gallbladder and approximately 13 gallstones measuring 3-4 mm mm stacked in the common bile duct to the ampulla. These calculi were not radiopaque by prior CT. 2. Diffuse inflammatory stranding about the pancreas and adjacent retroperitoneum, consistent with gallstone pancreatitis. No pancreatic ductal dilatation. No evidence of pancreatic parenchymal necrosis or acute pancreatic fluid collection. Electronically Signed   By: Eddie Candle M.D.   On: 02/25/2020 14:56   DG ESOPHAGUS W DOUBLE CM (HD)  Result Date: 02/16/2020 CLINICAL DATA:  Dysphagia with gastroesophageal reflux disease. History of scalp melanoma. EXAM: ESOPHOGRAM / BARIUM SWALLOW / BARIUM TABLET STUDY TECHNIQUE: Combined double contrast and single contrast examination performed using effervescent crystals, thick barium liquid, and thin barium liquid. The patient was observed with fluoroscopy swallowing a 13 mm barium sulphate tablet. FLUOROSCOPY TIME:  Fluoroscopy Time: 1 minutes and 12 seconds  of low-dose pulsed fluoroscopy. Radiation Exposure Index (if provided by the fluoroscopic device): 12 mGy Number of Acquired Spot Images: 0 COMPARISON:  PET-CT 12/25/2019 FINDINGS: The patient swallowed the barium without difficulty. Rapid sequence imaging of the pharynx in the AP and lateral projections demonstrates no mucosal abnormalities or laryngeal penetration. The esophageal motility is normal. There is no stricture, mass or ulceration. There is a small reducible hiatal hernia. Severe gastroesophageal reflux to the level of the thoracic inlet was observed with the water siphon test. A 13 mm barium tablet was administered and passed without delay into the stomach. IMPRESSION: 1. Severe gastroesophageal reflux. 2. Small reducible hiatal hernia. 3. No evidence of esophageal stricture or ulceration. Electronically Signed   By: Richardean Sale M.D.   On: 02/16/2020 09:23      Carmell Austria, MD 02/25/2020, 5:47  PM  Cc: No ref. provider found

## 2020-02-26 ENCOUNTER — Inpatient Hospital Stay (HOSPITAL_COMMUNITY): Payer: Medicare Other | Admitting: Anesthesiology

## 2020-02-26 ENCOUNTER — Encounter (HOSPITAL_COMMUNITY)
Admission: EM | Disposition: A | Discharge status: Home Health | Payer: Self-pay | Source: Home / Self Care | Attending: Internal Medicine

## 2020-02-26 ENCOUNTER — Inpatient Hospital Stay (HOSPITAL_COMMUNITY): Payer: Medicare Other

## 2020-02-26 ENCOUNTER — Encounter (HOSPITAL_COMMUNITY): Payer: Self-pay | Admitting: Internal Medicine

## 2020-02-26 DIAGNOSIS — K8050 Calculus of bile duct without cholangitis or cholecystitis without obstruction: Secondary | ICD-10-CM | POA: Diagnosis present

## 2020-02-26 DIAGNOSIS — R1010 Upper abdominal pain, unspecified: Secondary | ICD-10-CM

## 2020-02-26 DIAGNOSIS — K8051 Calculus of bile duct without cholangitis or cholecystitis with obstruction: Secondary | ICD-10-CM | POA: Diagnosis not present

## 2020-02-26 DIAGNOSIS — K859 Acute pancreatitis without necrosis or infection, unspecified: Secondary | ICD-10-CM

## 2020-02-26 HISTORY — PX: REMOVAL OF STONES: SHX5545

## 2020-02-26 HISTORY — PX: ERCP: SHX5425

## 2020-02-26 HISTORY — PX: SPHINCTEROTOMY: SHX5544

## 2020-02-26 LAB — COMPREHENSIVE METABOLIC PANEL
ALT: 138 U/L — ABNORMAL HIGH (ref 0–44)
AST: 66 U/L — ABNORMAL HIGH (ref 15–41)
Albumin: 3 g/dL — ABNORMAL LOW (ref 3.5–5.0)
Alkaline Phosphatase: 220 U/L — ABNORMAL HIGH (ref 38–126)
Anion gap: 8 (ref 5–15)
BUN: 11 mg/dL (ref 8–23)
CO2: 24 mmol/L (ref 22–32)
Calcium: 8.3 mg/dL — ABNORMAL LOW (ref 8.9–10.3)
Chloride: 108 mmol/L (ref 98–111)
Creatinine, Ser: 0.52 mg/dL — ABNORMAL LOW (ref 0.61–1.24)
GFR calc Af Amer: >60 mL/min (ref >60)
GFR calc non Af Amer: >60 mL/min (ref >60)
Glucose, Bld: 87 mg/dL (ref 70–99)
Potassium: 3.4 mmol/L — ABNORMAL LOW (ref 3.5–5.1)
Sodium: 140 mmol/L (ref 135–145)
Total Bilirubin: 2.1 mg/dL — ABNORMAL HIGH (ref 0.3–1.2)
Total Protein: 6.1 g/dL — ABNORMAL LOW (ref 6.5–8.1)

## 2020-02-26 LAB — CBC
HCT: 35.4 % — ABNORMAL LOW (ref 39.0–52.0)
Hemoglobin: 11.5 g/dL — ABNORMAL LOW (ref 13.0–17.0)
MCH: 32.3 pg (ref 26.0–34.0)
MCHC: 32.5 g/dL (ref 30.0–36.0)
MCV: 99.4 fL (ref 80.0–100.0)
Platelets: 162 10*3/uL (ref 150–400)
RBC: 3.56 MIL/uL — ABNORMAL LOW (ref 4.22–5.81)
RDW: 13.8 % (ref 11.5–15.5)
WBC: 7.9 10*3/uL (ref 4.0–10.5)
nRBC: 0 % (ref 0.0–0.2)

## 2020-02-26 LAB — LIPASE, BLOOD: Lipase: 48 U/L (ref 11–51)

## 2020-02-26 SURGERY — ERCP, WITH INTERVENTION IF INDICATED
Anesthesia: General

## 2020-02-26 MED ORDER — FENTANYL CITRATE (PF) 100 MCG/2ML IJ SOLN
INTRAMUSCULAR | Status: AC
  Filled 2020-02-26: qty 2

## 2020-02-26 MED ORDER — FENTANYL CITRATE (PF) 100 MCG/2ML IJ SOLN
INTRAMUSCULAR | Status: DC | PRN
  Administered 2020-02-26 (×2): 50 ug via INTRAVENOUS

## 2020-02-26 MED ORDER — SODIUM CHLORIDE 0.9 % IV SOLN
INTRAVENOUS | Status: DC | PRN
  Administered 2020-02-26: 50 mL

## 2020-02-26 MED ORDER — PHENYLEPHRINE 40 MCG/ML (10ML) SYRINGE FOR IV PUSH (FOR BLOOD PRESSURE SUPPORT)
PREFILLED_SYRINGE | INTRAVENOUS | Status: DC | PRN
  Administered 2020-02-26 (×3): 80 ug via INTRAVENOUS

## 2020-02-26 MED ORDER — ONDANSETRON HCL 4 MG/2ML IJ SOLN
INTRAMUSCULAR | Status: DC | PRN
  Administered 2020-02-26: 4 mg via INTRAVENOUS

## 2020-02-26 MED ORDER — DEXAMETHASONE SODIUM PHOSPHATE 10 MG/ML IJ SOLN
INTRAMUSCULAR | Status: DC | PRN
  Administered 2020-02-26: 5 mg via INTRAVENOUS

## 2020-02-26 MED ORDER — SODIUM CHLORIDE 0.9 % IV SOLN: INTRAVENOUS | Status: DC

## 2020-02-26 MED ORDER — GLUCAGON HCL RDNA (DIAGNOSTIC) 1 MG IJ SOLR
INTRAMUSCULAR | Status: AC
  Filled 2020-02-26: qty 1

## 2020-02-26 MED ORDER — INDOMETHACIN 50 MG RE SUPP
RECTAL | Status: DC | PRN
  Administered 2020-02-26: 100 mg via RECTAL

## 2020-02-26 MED ORDER — PROPOFOL 10 MG/ML IV BOLUS
INTRAVENOUS | Status: DC | PRN
  Administered 2020-02-26: 100 mg via INTRAVENOUS

## 2020-02-26 MED ORDER — INDOMETHACIN 50 MG RE SUPP
RECTAL | Status: AC
  Filled 2020-02-26: qty 2

## 2020-02-26 MED ORDER — LIDOCAINE 2% (20 MG/ML) 5 ML SYRINGE
INTRAMUSCULAR | Status: DC | PRN
  Administered 2020-02-26: 80 mg via INTRAVENOUS

## 2020-02-26 MED ORDER — LACTATED RINGERS IV SOLN
INTRAVENOUS | Status: DC
  Administered 2020-02-26: 1000 mL via INTRAVENOUS

## 2020-02-26 MED ORDER — SUCCINYLCHOLINE CHLORIDE 20 MG/ML IJ SOLN
INTRAMUSCULAR | Status: DC | PRN
  Administered 2020-02-26: 100 mg via INTRAVENOUS

## 2020-02-26 MED ORDER — EPHEDRINE SULFATE-NACL 50-0.9 MG/10ML-% IV SOSY
PREFILLED_SYRINGE | INTRAVENOUS | Status: DC | PRN
  Administered 2020-02-26 (×3): 10 mg via INTRAVENOUS

## 2020-02-26 NOTE — Op Note (Signed)
[facility] Patient Name: Miguel Thomas Procedure Date: 02/26/2020 MRN: [id number] Attending MD: Docia Chuck. Henrene Pastor , MD Date of Birth: Nov 22, 1940 CSN: [id number] Age: 79 Admit Type: Inpatient Procedure:                ERCP with biliary sphincterotomy and common duct                            stones extraction Indications:              Bile duct stone on Ultrasound, Abnormal liver                            function test, Acute pancreatitis Providers:                Docia Chuck. Henrene Pastor, MD, Cleda Daub, RN, Cherylynn Ridges, Technician, Herbie Drape, CRNA Referring MD:             [facility] Medicines:                General Anesthesia Complications:            No immediate complications. Estimated Blood Loss:     Estimated blood loss: none. Procedure:                Pre-Anesthesia Assessment:                           - Prior to the procedure, a History and Physical                            was performed, and patient medications and                            allergies were reviewed. The patient is competent.                            The risks and benefits of the procedure and the                            sedation options and risks were discussed with the                            patient. All questions were answered and informed                            consent was obtained. Patient identification and                            proposed procedure were verified by the physician.                            Mental Status Examination: alert and oriented.  Airway Examination: normal oropharyngeal airway and                            neck mobility. Respiratory Examination: clear to                            auscultation. CV Examination: normal. Prophylactic                            Antibiotics: The patient does not require                            prophylactic antibiotics. Prior Anticoagulants: The                 patient has taken no previous anticoagulant or                            antiplatelet agents. ASA Grade Assessment: II - A                            patient with mild systemic disease. After reviewing                            the risks and benefits, the patient was deemed in                            satisfactory condition to undergo the procedure.                            The anesthesia plan was to use moderate sedation /                            analgesia (conscious sedation). Immediately prior                            to administration of medications, the patient was                            re-assessed for adequacy to receive sedatives. The                            heart rate, respiratory rate, oxygen saturations,                            blood pressure, adequacy of pulmonary ventilation,                            and response to care were monitored throughout the                            procedure. The physical status of the patient was                            re-assessed after the procedure.  After obtaining informed consent, the scope was                            passed under direct vision. Throughout the                            procedure, the patient's blood pressure, pulse, and                            oxygen saturations were monitored continuously. The                            [id number] Olympus Doudenoscope was                            introduced through the mouth, and used to inject                            contrast into and used to inject contrast into the                            bile duct and ventral pancreatic duct. The ERCP was                            accomplished without difficulty. The patient                            tolerated the procedure well. Scope In: Scope Out: Findings:      The esophagus was successfully intubated under direct vision. The scope       was advanced to a  normal major papilla in the descending duodenum       without detailed examination of the pharynx, larynx and associated       structures, and upper GI tract. The upper GI tract was grossly normal.       Initial injection of contrast yielded short segment filling of the most       distal bile duct and pancreatic duct. The bile duct was subsequently       deeply cannulated with the sphincterotome and wire. Contrast was       injected. Opacification of the entire biliary tree except for the cystic       duct and gallbladder was successful. The main bile duct was moderately       dilated, with multiple stones causing an obstruction. A 0.035 inch       straight standard wire was passed into the biliary tree. Biliary       sphincterotomy was made with a sphincterotome using ERBE electrocautery.       There was no post-sphincterotomy bleeding. The biliary tree was swept       with a 15 mm balloon starting at the bifurcation. All stones were       removed. No stones remained. Impression:               - The entire main bile duct was moderately dilated,  with multiple stones causing an obstruction.                           - Choledocholithiasis was found. Complete removal                            was accomplished by balloon extraction.                           - A biliary sphincterotomy was performed.                           - The biliary tree was swept and all common duct                            stones were removed. Moderate Sedation:      none Recommendation:           - Observe patient's clinical course post ERCP.                           -Inpatient GI team will follow as needed                           -General surgery to determine timing of                            laparoscopic cholecystectomy Procedure Code(s):        --- Professional ---                           [phone], Endoscopic retrograde                            cholangiopancreatography (ERCP);  with removal of                            calculi/debris from biliary/pancreatic duct(s)                           43262, Endoscopic retrograde                            cholangiopancreatography (ERCP); with                            sphincterotomy/papillotomy Diagnosis Code(s):        --- Professional ---                           K80.51, Calculus of bile duct without cholangitis                            or cholecystitis with obstruction                           R94.5, Abnormal results of liver function studies  K85.90, Acute pancreatitis without necrosis or                            infection, unspecified CPT copyright 2019 [employer]. All rights reserved. The codes documented in this report are preliminary and upon coder review may  be revised to meet current compliance requirements. Docia Chuck. Henrene Pastor, MD 02/26/2020 12:46:37 PM This report has been signed electronically. Number of Addenda: 0

## 2020-02-26 NOTE — H&P (View-Only) (Signed)
[facility]                                                                              Patient Demographics  Miguel Thomas, is a 79 y.o. male, DOB - 10/05/1941, [id number]  Admit date - 02/24/2020   Admitting Physician Miguel August, MD  Outpatient Primary MD for the patient is Miguel Alar, NP  Outpatient specialists:   LOS - 2  days   Medical records reviewed and are as summarized below:    Chief Complaint  Patient presents with  . Nausea       Brief summary   Patient is 79 year old male with history of GERD, chronic reflux, dysphagia, melanoma scalp, COVID-19 in January 2021 presented with nausea, vomiting, abdominal pain. Patient was found to have elevated LFTs, lipase of 4135.  CT abdomen showed acute pancreatitis without necrosis, CBD dilatation, GI was consulted. Patient was admitted for further work-up.   Assessment & Plan    Principal Problem:   Acute gallstone pancreatitis with obstruction of CBD, transaminitis -Continue IV fluids, n.p.o., pain control -GI consulted, MRCP showed numerous tiny gallstones in the gallbladder, approximately 13 gallstones measuring 3 to 4 mm stacked in the common bile duct to the ampulla.  Diffuse inflammatory stranding about the pancreas and adjacent retroperitoneum consistent with gallstone pancreatitis -ERCP planned today.  Active Problems:   GERD -Continue IV PPI -Follows outpatient GI, Dr. Carlean Thomas  Code Status: Full CODE STATUS DVT Prophylaxis: SCDs Family Communication: Discussed all imaging results, lab results, explained to the patient    Disposition Plan:     Status is: Inpatient  Remains inpatient appropriate because:Inpatient level of care appropriate due to severity of illness   Dispo: The patient is from: Home              Anticipated d/c is to: Home              Anticipated d/c date is: 2 days              Patient currently is not medically stable to d/c.       Time Spent in  minutes   73mins    Procedures:  MRCP  Consultants:   GI  Antimicrobials:   Anti-infectives (From admission, onward)   Start     Dose/Rate Route Frequency Ordered Stop   02/25/20 1800  [MAR Hold]  ciprofloxacin (CIPRO) IVPB 400 mg     (MAR Hold since Mon 02/26/2020 at 1042.Hold Reason: Transfer to a Procedural area.)   400 mg 200 mL/hr over 60 Minutes Intravenous Every 12 hours 02/25/20 1746            Medications  Scheduled Meds: . [MAR Hold] heparin  5,000 Units Subcutaneous Q8H  . [MAR Hold] pantoprazole (PROTONIX) IV  40 mg Intravenous Q24H   Continuous Infusions: . sodium chloride 150 mL/hr at 02/25/20 1952  . [MAR Hold] ciprofloxacin 400 mg (02/26/20 0727)   PRN Meds:.[MAR Hold]  morphine injection, [MAR Hold] ondansetron **OR** [MAR Hold] ondansetron (ZOFRAN) IV      Subjective:   Miguel Thomas was seen and examined today.  Abdominal pain controlled with  pain medication.  Awaiting for ERCP at the time of my examination.  No new complaints.  Patient denies dizziness, chest pain, shortness of breath, new weakness, numbess, tingling. No acute events overnight.    Objective:   Vitals:   02/25/20 1412 02/25/20 2055 02/26/20 0553 02/26/20 1046  BP: 133/72 138/71 138/71 (!) 156/76  Pulse: 80 74 83 80  Resp: 16 20 18 10   Temp: 98.2 F (36.8 C) 98.4 F (36.9 C) 98.3 F (36.8 C) 99.1 F (37.3 C)  TempSrc: Oral Oral Oral Oral  SpO2: 100% 95% 94% 98%  Weight:    81.6 kg  Height:    5\' 11"  (1.803 m)    Intake/Output Summary (Last 24 hours) at 02/26/2020 1055 Last data filed at 02/26/2020 1019 Gross per 24 hour  Intake --  Output 2775 ml  Net -2775 ml     Wt Readings from Last 3 Encounters:  02/26/20 81.6 kg  02/13/20 81.6 kg  01/29/20 83.5 kg     Exam  General: Alert and oriented x 3, NAD  Cardiovascular: S1 S2 auscultated, no murmurs, RRR  Respiratory: Clear to auscultation bilaterally, no wheezing, rales or rhonchi  Gastrointestinal: Soft,  mild TTP in epigastric region  nondistended, + bowel sounds  Ext: no pedal edema bilaterally  Neuro: No new deficits  Musculoskeletal: No digital cyanosis, clubbing  Skin: No rashes  Psych: Normal affect and demeanor, alert and oriented x3    Data Reviewed:  I have personally reviewed following labs and imaging studies  Micro Results Recent Results (from the past 240 hour(s))  Respiratory Panel by RT PCR (Flu A&B, Covid) - Nasopharyngeal Swab     Status: None   Collection Time: 02/24/20  1:27 PM   Specimen: Nasopharyngeal Swab  Result Value Ref Range Status   SARS Coronavirus 2 by RT PCR NEGATIVE NEGATIVE Final    Comment: (NOTE) SARS-CoV-2 target nucleic acids are NOT DETECTED. The SARS-CoV-2 RNA is generally detectable in upper respiratoy specimens during the acute phase of infection. The lowest concentration of SARS-CoV-2 viral copies this assay can detect is 131 copies/mL. A negative result does not preclude SARS-Cov-2 infection and should not be used as the sole basis for treatment or other patient management decisions. A negative result may occur with  improper specimen collection/handling, submission of specimen other than nasopharyngeal swab, presence of viral mutation(s) within the areas targeted by this assay, and inadequate number of viral copies (<131 copies/mL). A negative result must be combined with clinical observations, patient history, and epidemiological information. The expected result is Negative. Fact Sheet for Patients:  PinkCheek.be Fact Sheet for Healthcare Providers:  GravelBags.it This test is not yet ap proved or cleared by the [place] FDA and  has been authorized for detection and/or diagnosis of SARS-CoV-2 by FDA under an Emergency Use Authorization (EUA). This EUA will remain  in effect (meaning this test can be used) for the duration of the COVID-19 declaration under Section  564(b)(1) of the Act, 21 U.S.C. section 360bbb-3(b)(1), unless the authorization is terminated or revoked sooner.    Influenza A by PCR NEGATIVE NEGATIVE Final   Influenza B by PCR NEGATIVE NEGATIVE Final    Comment: (NOTE) The Xpert Xpress SARS-CoV-2/FLU/RSV assay is intended as an aid in  the diagnosis of influenza from Nasopharyngeal swab specimens and  should not be used as a sole basis for treatment. Nasal washings and  aspirates are unacceptable for Xpert Xpress SARS-CoV-2/FLU/RSV  testing. Fact Sheet for Patients: PinkCheek.be Fact  Sheet for Healthcare Providers: GravelBags.it This test is not yet approved or cleared by the [place] and  has been authorized for detection and/or diagnosis of SARS-CoV-2 by  FDA under an Emergency Use Authorization (EUA). This EUA will remain  in effect (meaning this test can be used) for the duration of the  Covid-19 declaration under Section 564(b)(1) of the Act, 21  U.S.C. section 360bbb-3(b)(1), unless the authorization is  terminated or revoked. Performed at [facility], [place] [address] [place], [place] [address]     Radiology Reports CT ABDOMEN PELVIS W CONTRAST  Result Date: 02/24/2020 CLINICAL DATA:  Abdominal pain and elevated liver function studies. Nausea and vomiting since last evening. EXAM: CT ABDOMEN AND PELVIS WITH CONTRAST TECHNIQUE: Multidetector CT imaging of the abdomen and pelvis was performed using the standard protocol following bolus administration of intravenous contrast. CONTRAST:  151mL OMNIPAQUE IOHEXOL 300 MG/ML  SOLN COMPARISON:  PET-CT 12/25/2019 FINDINGS: Lower chest: Peripheral interstitial fibrosis and calcified pleural plaques. No infiltrates or effusions. No worrisome pulmonary lesions. The heart is normal in size. No pericardial effusion. Small hiatal hernia. Hepatobiliary: No focal hepatic lesions or intrahepatic biliary  dilatation. The gallbladder appears normal. Mild common bile duct dilatation. It measures 12 mm in the porta hepatis and 8.5 mm in the head of the pancreas. No obvious common bile duct stone. Pancreas: Moderate inflammation in and around the pancreas diffusely consistent with acute pancreatitis. No findings suspicious for pancreatic necrosis. No pancreatic ductal dilatation. Spleen: Normal size. No focal lesions. Adrenals/Urinary Tract: Small low-attenuation left adrenal gland lesion consistent with benign adenoma. There is a large right renal cyst. No worrisome renal lesions or hydroureteronephrosis. The bladder is unremarkable. Stomach/Bowel: The stomach, duodenum, small bowel and colon are grossly normal without oral contrast. No acute inflammatory changes, mass lesions or obstructive findings. There is some inflammation in fluid surrounding the second and third portions of the duodenum secondary to the patient's pancreatitis. Colonic diverticulosis is noted without findings for acute diverticulitis. Vascular/Lymphatic: Scattered atherosclerotic calcifications involving the aorta iliac arteries and branch vessels but no aneurysm or dissection. The major venous structures are patent. No mesenteric or retroperitoneal mass or adenopathy. Reproductive: The prostate gland and seminal vesicles are unremarkable. Other: No free pelvic fluid collections or pelvic adenopathy. No inguinal mass or adenopathy. Musculoskeletal: No significant bony findings. Scoliosis and moderate degenerative changes involving the spine. IMPRESSION: 1. CT findings consistent with acute pancreatitis. No findings suspicious for pancreatic necrosis. 2. Common bile duct dilatation new since February 2021 PET-CT. No obvious obstructing common bile duct stone by CT. MRI/MRCP may be helpful for further evaluation if clinically indicated. 3. Colonic diverticulosis without findings for acute diverticulitis. 4. Small hiatal hernia. 5. Peripheral  interstitial fibrosis and calcified pleural plaques at the lung bases. 6. Aortic atherosclerosis. Aortic Atherosclerosis (ICD10-I70.0). Electronically Signed   By: Marijo Sanes M.D.   On: 02/24/2020 12:10   MR 3D Recon At Scanner  Result Date: 02/25/2020 CLINICAL DATA:  Pancreatitis suspected, dilated CBD, elevated LFTs, history of scalp melanoma EXAM: MRI ABDOMEN WITH CONTRAST (WITH MRCP) TECHNIQUE: Multiplanar multisequence MR imaging of the abdomen was performed following the administration of intravenous contrast. Heavily T2-weighted images of the biliary and pancreatic ducts were obtained, and three-dimensional MRCP images were rendered by post processing. CONTRAST:  79mL GADAVIST GADOBUTROL 1 MMOL/ML IV SOLN COMPARISON:  CT abdomen pelvis, 02/24/2020, PET-CT, 12/25/2019 FINDINGS: Examination is generally limited by breath motion artifact. Lower chest: Trace bilateral pleural effusions. Hepatobiliary: No  mass or other parenchymal abnormality identified. Numerous tiny gallstones in the gallbladder. There are approximately 13 tiny gallstones measuring 3-4 mm stacked in the common bile duct to the ampulla (series 10, image 22). The common bile duct is mildly enlarged measuring up to 8 mm. Pancreas: Diffuse inflammatory stranding about the pancreas and adjacent retroperitoneum. No pancreatic ductal dilatation. No evidence of parenchymal hypoenhancement or fluid collection. Spleen:  Within normal limits in size and appearance. Adrenals/Urinary Tract: No masses identified. Exophytic fluid signal cyst of the right kidney. No evidence of hydronephrosis. Stomach/Bowel: Visualized portions within the abdomen are unremarkable. Vascular/Lymphatic: No pathologically enlarged lymph nodes identified. No abdominal aortic aneurysm demonstrated. Other:  None. Musculoskeletal: No suspicious bone lesions identified. IMPRESSION: 1. Numerous tiny gallstones in the gallbladder and approximately 13 gallstones measuring 3-4 mm mm  stacked in the common bile duct to the ampulla. These calculi were not radiopaque by prior CT. 2. Diffuse inflammatory stranding about the pancreas and adjacent retroperitoneum, consistent with gallstone pancreatitis. No pancreatic ductal dilatation. No evidence of pancreatic parenchymal necrosis or acute pancreatic fluid collection. Electronically Signed   By: Eddie Candle M.D.   On: 02/25/2020 14:56   MR ABDOMEN WITH MRCP W CONTRAST  Result Date: 02/25/2020 CLINICAL DATA:  Pancreatitis suspected, dilated CBD, elevated LFTs, history of scalp melanoma EXAM: MRI ABDOMEN WITH CONTRAST (WITH MRCP) TECHNIQUE: Multiplanar multisequence MR imaging of the abdomen was performed following the administration of intravenous contrast. Heavily T2-weighted images of the biliary and pancreatic ducts were obtained, and three-dimensional MRCP images were rendered by post processing. CONTRAST:  75mL GADAVIST GADOBUTROL 1 MMOL/ML IV SOLN COMPARISON:  CT abdomen pelvis, 02/24/2020, PET-CT, 12/25/2019 FINDINGS: Examination is generally limited by breath motion artifact. Lower chest: Trace bilateral pleural effusions. Hepatobiliary: No mass or other parenchymal abnormality identified. Numerous tiny gallstones in the gallbladder. There are approximately 13 tiny gallstones measuring 3-4 mm stacked in the common bile duct to the ampulla (series 10, image 22). The common bile duct is mildly enlarged measuring up to 8 mm. Pancreas: Diffuse inflammatory stranding about the pancreas and adjacent retroperitoneum. No pancreatic ductal dilatation. No evidence of parenchymal hypoenhancement or fluid collection. Spleen:  Within normal limits in size and appearance. Adrenals/Urinary Tract: No masses identified. Exophytic fluid signal cyst of the right kidney. No evidence of hydronephrosis. Stomach/Bowel: Visualized portions within the abdomen are unremarkable. Vascular/Lymphatic: No pathologically enlarged lymph nodes identified. No abdominal  aortic aneurysm demonstrated. Other:  None. Musculoskeletal: No suspicious bone lesions identified. IMPRESSION: 1. Numerous tiny gallstones in the gallbladder and approximately 13 gallstones measuring 3-4 mm mm stacked in the common bile duct to the ampulla. These calculi were not radiopaque by prior CT. 2. Diffuse inflammatory stranding about the pancreas and adjacent retroperitoneum, consistent with gallstone pancreatitis. No pancreatic ductal dilatation. No evidence of pancreatic parenchymal necrosis or acute pancreatic fluid collection. Electronically Signed   By: Eddie Candle M.D.   On: 02/25/2020 14:56   DG ESOPHAGUS W DOUBLE CM (HD)  Result Date: 02/16/2020 CLINICAL DATA:  Dysphagia with gastroesophageal reflux disease. History of scalp melanoma. EXAM: ESOPHOGRAM / BARIUM SWALLOW / BARIUM TABLET STUDY TECHNIQUE: Combined double contrast and single contrast examination performed using effervescent crystals, thick barium liquid, and thin barium liquid. The patient was observed with fluoroscopy swallowing a 13 mm barium sulphate tablet. FLUOROSCOPY TIME:  Fluoroscopy Time: 1 minutes and 12 seconds of low-dose pulsed fluoroscopy. Radiation Exposure Index (if provided by the fluoroscopic device): 12 mGy Number of Acquired Spot Images: 0 COMPARISON:  PET-CT 12/25/2019 FINDINGS: The patient swallowed the barium without difficulty. Rapid sequence imaging of the pharynx in the AP and lateral projections demonstrates no mucosal abnormalities or laryngeal penetration. The esophageal motility is normal. There is no stricture, mass or ulceration. There is a small reducible hiatal hernia. Severe gastroesophageal reflux to the level of the thoracic inlet was observed with the water siphon test. A 13 mm barium tablet was administered and passed without delay into the stomach. IMPRESSION: 1. Severe gastroesophageal reflux. 2. Small reducible hiatal hernia. 3. No evidence of esophageal stricture or ulceration.  Electronically Signed   By: Richardean Sale M.D.   On: 02/16/2020 09:23    Lab Data:  CBC: Recent Labs  Lab 02/24/20 0933 02/25/20 0556 02/26/20 0528  WBC 7.4 7.2 7.9  HGB 14.1 11.7* 11.5*  HCT 43.6 36.3* 35.4*  MCV 101.2* 100.8* 99.4  PLT 220 165 0000000   Basic Metabolic Panel: Recent Labs  Lab 02/24/20 0933 02/24/20 1510 02/25/20 0556 02/26/20 0528  NA 139  --  141 140  K 4.2  --  3.6 3.4*  CL 107  --  110 108  CO2 21*  --  25 24  GLUCOSE 155*  --  89 87  BUN 17  --  17 11  CREATININE 0.87 0.81 0.61 0.52*  CALCIUM 8.8*  --  8.3* 8.3*  MG  --   --  2.0  --    GFR: Estimated Creatinine Clearance: 77.1 mL/min (A) (by C-G formula based on SCr of 0.52 mg/dL (L)). Liver Function Tests: Recent Labs  Lab 02/24/20 0933 02/25/20 0556 02/26/20 0528  AST 249* 130* 66*  ALT 289* 206* 138*  ALKPHOS 300* 271* 220*  BILITOT 3.4* 3.3* 2.1*  PROT 7.7 6.6 6.1*  ALBUMIN 3.9 3.1* 3.0*   Recent Labs  Lab 02/24/20 0933 02/25/20 1742 02/26/20 0528  LIPASE 4,135* 322* 48   No results for input(s): AMMONIA in the last 168 hours. Coagulation Profile: No results for input(s): INR, PROTIME in the last 168 hours. Cardiac Enzymes: No results for input(s): CKTOTAL, CKMB, CKMBINDEX, TROPONINI in the last 168 hours. BNP (last 3 results) No results for input(s): PROBNP in the last 8760 hours. HbA1C: No results for input(s): HGBA1C in the last 72 hours. CBG: Recent Labs  Lab 02/24/20 2106 02/25/20 0005 02/25/20 0416 02/25/20 0756 02/25/20 2107  GLUCAP 99 78 81 77 64*   Lipid Profile: Recent Labs    02/25/20 0556  CHOL 190  HDL 69  LDLCALC 111*  TRIG 49  CHOLHDL 2.8   Thyroid Function Tests: No results for input(s): TSH, T4TOTAL, FREET4, T3FREE, THYROIDAB in the last 72 hours. Anemia Panel: No results for input(s): VITAMINB12, FOLATE, FERRITIN, TIBC, IRON, RETICCTPCT in the last 72 hours. Urine analysis:    Component Value Date/Time   COLORURINE AMBER (A)  02/24/2020 0933   APPEARANCEUR CLEAR 02/24/2020 0933   LABSPEC 1.010 02/24/2020 0933   PHURINE 5.0 02/24/2020 0933   GLUCOSEU NEGATIVE 02/24/2020 0933   GLUCOSEU NEGATIVE 08/30/2008 0000   HGBUR NEGATIVE 02/24/2020 0933   BILIRUBINUR NEGATIVE 02/24/2020 0933   KETONESUR NEGATIVE 02/24/2020 0933   PROTEINUR NEGATIVE 02/24/2020 0933   UROBILINOGEN 0.2 mg/dL 08/30/2008 0000   NITRITE NEGATIVE 02/24/2020 0933   LEUKOCYTESUR NEGATIVE 02/24/2020 0933     Dorothe Elmore M.D. [facility] 02/26/2020, 10:55 AM   Call night coverage person covering after 7pm

## 2020-02-26 NOTE — Transfer of Care (Signed)
Immediate Anesthesia Transfer of Care Note  Patient: Miguel Thomas  Procedure(s) Performed: ENDOSCOPIC RETROGRADE CHOLANGIOPANCREATOGRAPHY (ERCP) (N/A ) SPHINCTEROTOMY REMOVAL OF STONES  Patient Location: PACU  Anesthesia Type:General  Level of Consciousness: awake, alert  and oriented  Airway & Oxygen Therapy: Patient Spontanous Breathing and Patient connected to face mask oxygen  Post-op Assessment: Report given to RN and Post -op Vital signs reviewed and stable  Post vital signs: Reviewed and stable  Last Vitals:  Vitals Value Taken Time  BP 136/68 02/26/20 1257  Temp    Pulse 78 02/26/20 1258  Resp 18 02/26/20 1258  SpO2 100 % 02/26/20 1258  Vitals shown include unvalidated device data.  Last Pain:  Vitals:   02/26/20 1046  TempSrc: Oral  PainSc:       Patients Stated Pain Goal: 2 ([id number])  Complications: No apparent anesthesia complications

## 2020-02-26 NOTE — Anesthesia Preprocedure Evaluation (Signed)
Anesthesia Evaluation  Patient identified by MRN, date of birth, ID band Patient awake    Reviewed: Allergy & Precautions, NPO status , Patient's Chart, lab work & pertinent test results  Airway Mallampati: II  TM Distance: >3 FB     Dental   Pulmonary    breath sounds clear to auscultation       Cardiovascular negative cardio ROS   Rhythm:Regular Rate:Normal     Neuro/Psych    GI/Hepatic Neg liver ROS, hiatal hernia, GERD  ,  Endo/Other  negative endocrine ROS  Renal/GU negative Renal ROS     Musculoskeletal   Abdominal   Peds  Hematology  (+) anemia ,   Anesthesia Other Findings   Reproductive/Obstetrics                             Anesthesia Physical Anesthesia Plan  ASA: III  Anesthesia Plan: General   Post-op Pain Management:    Induction: Intravenous  PONV Risk Score and Plan: 2 and Ondansetron and Midazolam  Airway Management Planned: Oral ETT  Additional Equipment:   Intra-op Plan:   Post-operative Plan: Possible Post-op intubation/ventilation  Informed Consent: I have reviewed the patients History and Physical, chart, labs and discussed the procedure including the risks, benefits and alternatives for the proposed anesthesia with the patient or authorized representative who has indicated his/her understanding and acceptance.     Dental advisory given  Plan Discussed with: CRNA and Anesthesiologist  Anesthesia Plan Comments:         Anesthesia Quick Evaluation

## 2020-02-26 NOTE — Anesthesia Postprocedure Evaluation (Signed)
Anesthesia Post Note  Patient: Miguel Thomas  Procedure(s) Performed: ENDOSCOPIC RETROGRADE CHOLANGIOPANCREATOGRAPHY (ERCP) (N/A ) SPHINCTEROTOMY REMOVAL OF STONES     Patient location during evaluation: PACU Anesthesia Type: General Level of consciousness: awake Pain management: pain level controlled Vital Signs Assessment: post-procedure vital signs reviewed and stable Respiratory status: spontaneous breathing Cardiovascular status: stable Postop Assessment: no apparent nausea or vomiting Anesthetic complications: no    Last Vitals:  Vitals:   02/26/20 1330 02/26/20 1349  BP: (!) 142/69 137/76  Pulse: 72 70  Resp: 12 17  Temp:  36.7 C  SpO2: 97% 94%    Last Pain:  Vitals:   02/26/20 1349  TempSrc: Oral  PainSc:                  Rune Mendez

## 2020-02-26 NOTE — Interval H&P Note (Signed)
History and Physical Interval Note:  02/26/2020 11:12 AM  Miguel Thomas  has presented today for surgery, with the diagnosis of CBD stones. Biliary pancreatits.  The various methods of treatment have been discussed with the patient and family. After consideration of risks, benefits and other options for treatment, the patient has consented to  Procedure(s): ENDOSCOPIC RETROGRADE CHOLANGIOPANCREATOGRAPHY (ERCP) (N/A) as a surgical intervention.  The patient's history has been reviewed, patient examined, no change in status, stable for surgery.  I have reviewed the patient's chart and labs.  Questions were answered to the patient's satisfaction.     Scarlette Shorts

## 2020-02-26 NOTE — Progress Notes (Signed)
[facility]                                                                              Patient Demographics  Miguel Thomas, is a 79 y.o. male, DOB - 08-Jul-1941, [id number]  Admit date - 02/24/2020   Admitting Physician Aline August, MD  Outpatient Primary MD for the patient is [name], NP  Outpatient specialists:   LOS - 2  days   Medical records reviewed and are as summarized below:    Chief Complaint  Patient presents with  . Nausea       Brief summary   Patient is 79 year old male with history of GERD, chronic reflux, dysphagia, melanoma scalp, COVID-19 in January 2021 presented with nausea, vomiting, abdominal pain. Patient was found to have elevated LFTs, lipase of 4135.  CT abdomen showed acute pancreatitis without necrosis, CBD dilatation, GI was consulted. Patient was admitted for further work-up.   Assessment & Plan    Principal Problem:   Acute gallstone pancreatitis with obstruction of CBD, transaminitis -Continue IV fluids, n.p.o., pain control -GI consulted, MRCP showed numerous tiny gallstones in the gallbladder, approximately 13 gallstones measuring 3 to 4 mm stacked in the common bile duct to the ampulla.  Diffuse inflammatory stranding about the pancreas and adjacent retroperitoneum consistent with gallstone pancreatitis -ERCP planned today.  Active Problems:   GERD -Continue IV PPI -Follows outpatient GI, Dr. Carlean Purl  Code Status: Full CODE STATUS DVT Prophylaxis: SCDs Family Communication: Discussed all imaging results, lab results, explained to the patient    Disposition Plan:     Status is: Inpatient  Remains inpatient appropriate because:Inpatient level of care appropriate due to severity of illness   Dispo: The patient is from: Home              Anticipated d/c is to: Home              Anticipated d/c date is: 2 days              Patient currently is not medically stable to d/c.       Time Spent in  minutes   10mins    Procedures:  MRCP  Consultants:   GI  Antimicrobials:   Anti-infectives (From admission, onward)   Start     Dose/Rate Route Frequency Ordered Stop   02/25/20 1800  [MAR Hold]  ciprofloxacin (CIPRO) IVPB 400 mg     (MAR Hold since Mon 02/26/2020 at 1042.Hold Reason: Transfer to a Procedural area.)   400 mg 200 mL/hr over 60 Minutes Intravenous Every 12 hours 02/25/20 1746            Medications  Scheduled Meds: . [MAR Hold] heparin  5,000 Units Subcutaneous Q8H  . [MAR Hold] pantoprazole (PROTONIX) IV  40 mg Intravenous Q24H   Continuous Infusions: . sodium chloride 150 mL/hr at 02/25/20 1952  . [MAR Hold] ciprofloxacin 400 mg (02/26/20 0727)   PRN Meds:.[MAR Hold]  morphine injection, [MAR Hold] ondansetron **OR** [MAR Hold] ondansetron (ZOFRAN) IV      Subjective:   Miguel Thomas was seen and examined today.  Abdominal pain controlled with  pain medication.  Awaiting for ERCP at the time of my examination.  No new complaints.  Patient denies dizziness, chest pain, shortness of breath, new weakness, numbess, tingling. No acute events overnight.    Objective:   Vitals:   02/25/20 1412 02/25/20 2055 02/26/20 0553 02/26/20 1046  BP: 133/72 138/71 138/71 (!) 156/76  Pulse: 80 74 83 80  Resp: 16 20 18 10   Temp: 98.2 F (36.8 C) 98.4 F (36.9 C) 98.3 F (36.8 C) 99.1 F (37.3 C)  TempSrc: Oral Oral Oral Oral  SpO2: 100% 95% 94% 98%  Weight:    81.6 kg  Height:    5\' 11"  (1.803 m)    Intake/Output Summary (Last 24 hours) at 02/26/2020 1055 Last data filed at 02/26/2020 1019 Gross per 24 hour  Intake --  Output 2775 ml  Net -2775 ml     Wt Readings from Last 3 Encounters:  02/26/20 81.6 kg  02/13/20 81.6 kg  01/29/20 83.5 kg     Exam  General: Alert and oriented x 3, NAD  Cardiovascular: S1 S2 auscultated, no murmurs, RRR  Respiratory: Clear to auscultation bilaterally, no wheezing, rales or rhonchi  Gastrointestinal: Soft,  mild TTP in epigastric region  nondistended, + bowel sounds  Ext: no pedal edema bilaterally  Neuro: No new deficits  Musculoskeletal: No digital cyanosis, clubbing  Skin: No rashes  Psych: Normal affect and demeanor, alert and oriented x3    Data Reviewed:  I have personally reviewed following labs and imaging studies  Micro Results Recent Results (from the past 240 hour(s))  Respiratory Panel by RT PCR (Flu A&B, Covid) - Nasopharyngeal Swab     Status: None   Collection Time: 02/24/20  1:27 PM   Specimen: Nasopharyngeal Swab  Result Value Ref Range Status   SARS Coronavirus 2 by RT PCR NEGATIVE NEGATIVE Final    Comment: (NOTE) SARS-CoV-2 target nucleic acids are NOT DETECTED. The SARS-CoV-2 RNA is generally detectable in upper respiratoy specimens during the acute phase of infection. The lowest concentration of SARS-CoV-2 viral copies this assay can detect is 131 copies/mL. A negative result does not preclude SARS-Cov-2 infection and should not be used as the sole basis for treatment or other patient management decisions. A negative result may occur with  improper specimen collection/handling, submission of specimen other than nasopharyngeal swab, presence of viral mutation(s) within the areas targeted by this assay, and inadequate number of viral copies (<131 copies/mL). A negative result must be combined with clinical observations, patient history, and epidemiological information. The expected result is Negative. Fact Sheet for Patients:  PinkCheek.be Fact Sheet for Healthcare Providers:  GravelBags.it This test is not yet ap proved or cleared by the [place] FDA and  has been authorized for detection and/or diagnosis of SARS-CoV-2 by FDA under an Emergency Use Authorization (EUA). This EUA will remain  in effect (meaning this test can be used) for the duration of the COVID-19 declaration under Section  564(b)(1) of the Act, 21 U.S.C. section 360bbb-3(b)(1), unless the authorization is terminated or revoked sooner.    Influenza A by PCR NEGATIVE NEGATIVE Final   Influenza B by PCR NEGATIVE NEGATIVE Final    Comment: (NOTE) The Xpert Xpress SARS-CoV-2/FLU/RSV assay is intended as an aid in  the diagnosis of influenza from Nasopharyngeal swab specimens and  should not be used as a sole basis for treatment. Nasal washings and  aspirates are unacceptable for Xpert Xpress SARS-CoV-2/FLU/RSV  testing. Fact Sheet for Patients: PinkCheek.be Fact  Sheet for Healthcare Providers: GravelBags.it This test is not yet approved or cleared by the [place] and  has been authorized for detection and/or diagnosis of SARS-CoV-2 by  FDA under an Emergency Use Authorization (EUA). This EUA will remain  in effect (meaning this test can be used) for the duration of the  Covid-19 declaration under Section 564(b)(1) of the Act, 21  U.S.C. section 360bbb-3(b)(1), unless the authorization is  terminated or revoked. Performed at [facility], [place] [address], [place], [place] [address]     Radiology Reports CT ABDOMEN PELVIS W CONTRAST  Result Date: 02/24/2020 CLINICAL DATA:  Abdominal pain and elevated liver function studies. Nausea and vomiting since last evening. EXAM: CT ABDOMEN AND PELVIS WITH CONTRAST TECHNIQUE: Multidetector CT imaging of the abdomen and pelvis was performed using the standard protocol following bolus administration of intravenous contrast. CONTRAST:  168mL OMNIPAQUE IOHEXOL 300 MG/ML  SOLN COMPARISON:  PET-CT 12/25/2019 FINDINGS: Lower chest: Peripheral interstitial fibrosis and calcified pleural plaques. No infiltrates or effusions. No worrisome pulmonary lesions. The heart is normal in size. No pericardial effusion. Small hiatal hernia. Hepatobiliary: No focal hepatic lesions or intrahepatic biliary  dilatation. The gallbladder appears normal. Mild common bile duct dilatation. It measures 12 mm in the porta hepatis and 8.5 mm in the head of the pancreas. No obvious common bile duct stone. Pancreas: Moderate inflammation in and around the pancreas diffusely consistent with acute pancreatitis. No findings suspicious for pancreatic necrosis. No pancreatic ductal dilatation. Spleen: Normal size. No focal lesions. Adrenals/Urinary Tract: Small low-attenuation left adrenal gland lesion consistent with benign adenoma. There is a large right renal cyst. No worrisome renal lesions or hydroureteronephrosis. The bladder is unremarkable. Stomach/Bowel: The stomach, duodenum, small bowel and colon are grossly normal without oral contrast. No acute inflammatory changes, mass lesions or obstructive findings. There is some inflammation in fluid surrounding the second and third portions of the duodenum secondary to the patient's pancreatitis. Colonic diverticulosis is noted without findings for acute diverticulitis. Vascular/Lymphatic: Scattered atherosclerotic calcifications involving the aorta iliac arteries and branch vessels but no aneurysm or dissection. The major venous structures are patent. No mesenteric or retroperitoneal mass or adenopathy. Reproductive: The prostate gland and seminal vesicles are unremarkable. Other: No free pelvic fluid collections or pelvic adenopathy. No inguinal mass or adenopathy. Musculoskeletal: No significant bony findings. Scoliosis and moderate degenerative changes involving the spine. IMPRESSION: 1. CT findings consistent with acute pancreatitis. No findings suspicious for pancreatic necrosis. 2. Common bile duct dilatation new since February 2021 PET-CT. No obvious obstructing common bile duct stone by CT. MRI/MRCP may be helpful for further evaluation if clinically indicated. 3. Colonic diverticulosis without findings for acute diverticulitis. 4. Small hiatal hernia. 5. Peripheral  interstitial fibrosis and calcified pleural plaques at the lung bases. 6. Aortic atherosclerosis. Aortic Atherosclerosis (ICD10-I70.0). Electronically Signed   By: Marijo Sanes M.D.   On: 02/24/2020 12:10   MR 3D Recon At Scanner  Result Date: 02/25/2020 CLINICAL DATA:  Pancreatitis suspected, dilated CBD, elevated LFTs, history of scalp melanoma EXAM: MRI ABDOMEN WITH CONTRAST (WITH MRCP) TECHNIQUE: Multiplanar multisequence MR imaging of the abdomen was performed following the administration of intravenous contrast. Heavily T2-weighted images of the biliary and pancreatic ducts were obtained, and three-dimensional MRCP images were rendered by post processing. CONTRAST:  8mL GADAVIST GADOBUTROL 1 MMOL/ML IV SOLN COMPARISON:  CT abdomen pelvis, 02/24/2020, PET-CT, 12/25/2019 FINDINGS: Examination is generally limited by breath motion artifact. Lower chest: Trace bilateral pleural effusions. Hepatobiliary: No  mass or other parenchymal abnormality identified. Numerous tiny gallstones in the gallbladder. There are approximately 13 tiny gallstones measuring 3-4 mm stacked in the common bile duct to the ampulla (series 10, image 22). The common bile duct is mildly enlarged measuring up to 8 mm. Pancreas: Diffuse inflammatory stranding about the pancreas and adjacent retroperitoneum. No pancreatic ductal dilatation. No evidence of parenchymal hypoenhancement or fluid collection. Spleen:  Within normal limits in size and appearance. Adrenals/Urinary Tract: No masses identified. Exophytic fluid signal cyst of the right kidney. No evidence of hydronephrosis. Stomach/Bowel: Visualized portions within the abdomen are unremarkable. Vascular/Lymphatic: No pathologically enlarged lymph nodes identified. No abdominal aortic aneurysm demonstrated. Other:  None. Musculoskeletal: No suspicious bone lesions identified. IMPRESSION: 1. Numerous tiny gallstones in the gallbladder and approximately 13 gallstones measuring 3-4 mm mm  stacked in the common bile duct to the ampulla. These calculi were not radiopaque by prior CT. 2. Diffuse inflammatory stranding about the pancreas and adjacent retroperitoneum, consistent with gallstone pancreatitis. No pancreatic ductal dilatation. No evidence of pancreatic parenchymal necrosis or acute pancreatic fluid collection. Electronically Signed   By: Eddie Candle M.D.   On: 02/25/2020 14:56   MR ABDOMEN WITH MRCP W CONTRAST  Result Date: 02/25/2020 CLINICAL DATA:  Pancreatitis suspected, dilated CBD, elevated LFTs, history of scalp melanoma EXAM: MRI ABDOMEN WITH CONTRAST (WITH MRCP) TECHNIQUE: Multiplanar multisequence MR imaging of the abdomen was performed following the administration of intravenous contrast. Heavily T2-weighted images of the biliary and pancreatic ducts were obtained, and three-dimensional MRCP images were rendered by post processing. CONTRAST:  38mL GADAVIST GADOBUTROL 1 MMOL/ML IV SOLN COMPARISON:  CT abdomen pelvis, 02/24/2020, PET-CT, 12/25/2019 FINDINGS: Examination is generally limited by breath motion artifact. Lower chest: Trace bilateral pleural effusions. Hepatobiliary: No mass or other parenchymal abnormality identified. Numerous tiny gallstones in the gallbladder. There are approximately 13 tiny gallstones measuring 3-4 mm stacked in the common bile duct to the ampulla (series 10, image 22). The common bile duct is mildly enlarged measuring up to 8 mm. Pancreas: Diffuse inflammatory stranding about the pancreas and adjacent retroperitoneum. No pancreatic ductal dilatation. No evidence of parenchymal hypoenhancement or fluid collection. Spleen:  Within normal limits in size and appearance. Adrenals/Urinary Tract: No masses identified. Exophytic fluid signal cyst of the right kidney. No evidence of hydronephrosis. Stomach/Bowel: Visualized portions within the abdomen are unremarkable. Vascular/Lymphatic: No pathologically enlarged lymph nodes identified. No abdominal  aortic aneurysm demonstrated. Other:  None. Musculoskeletal: No suspicious bone lesions identified. IMPRESSION: 1. Numerous tiny gallstones in the gallbladder and approximately 13 gallstones measuring 3-4 mm mm stacked in the common bile duct to the ampulla. These calculi were not radiopaque by prior CT. 2. Diffuse inflammatory stranding about the pancreas and adjacent retroperitoneum, consistent with gallstone pancreatitis. No pancreatic ductal dilatation. No evidence of pancreatic parenchymal necrosis or acute pancreatic fluid collection. Electronically Signed   By: Eddie Candle M.D.   On: 02/25/2020 14:56   DG ESOPHAGUS W DOUBLE CM (HD)  Result Date: 02/16/2020 CLINICAL DATA:  Dysphagia with gastroesophageal reflux disease. History of scalp melanoma. EXAM: ESOPHOGRAM / BARIUM SWALLOW / BARIUM TABLET STUDY TECHNIQUE: Combined double contrast and single contrast examination performed using effervescent crystals, thick barium liquid, and thin barium liquid. The patient was observed with fluoroscopy swallowing a 13 mm barium sulphate tablet. FLUOROSCOPY TIME:  Fluoroscopy Time: 1 minutes and 12 seconds of low-dose pulsed fluoroscopy. Radiation Exposure Index (if provided by the fluoroscopic device): 12 mGy Number of Acquired Spot Images: 0 COMPARISON:  PET-CT 12/25/2019 FINDINGS: The patient swallowed the barium without difficulty. Rapid sequence imaging of the pharynx in the AP and lateral projections demonstrates no mucosal abnormalities or laryngeal penetration. The esophageal motility is normal. There is no stricture, mass or ulceration. There is a small reducible hiatal hernia. Severe gastroesophageal reflux to the level of the thoracic inlet was observed with the water siphon test. A 13 mm barium tablet was administered and passed without delay into the stomach. IMPRESSION: 1. Severe gastroesophageal reflux. 2. Small reducible hiatal hernia. 3. No evidence of esophageal stricture or ulceration.  Electronically Signed   By: Richardean Sale M.D.   On: 02/16/2020 09:23    Lab Data:  CBC: Recent Labs  Lab 02/24/20 0933 02/25/20 0556 02/26/20 0528  WBC 7.4 7.2 7.9  HGB 14.1 11.7* 11.5*  HCT 43.6 36.3* 35.4*  MCV 101.2* 100.8* 99.4  PLT 220 165 0000000   Basic Metabolic Panel: Recent Labs  Lab 02/24/20 0933 02/24/20 1510 02/25/20 0556 02/26/20 0528  NA 139  --  141 140  K 4.2  --  3.6 3.4*  CL 107  --  110 108  CO2 21*  --  25 24  GLUCOSE 155*  --  89 87  BUN 17  --  17 11  CREATININE 0.87 0.81 0.61 0.52*  CALCIUM 8.8*  --  8.3* 8.3*  MG  --   --  2.0  --    GFR: Estimated Creatinine Clearance: 77.1 mL/min (A) (by C-G formula based on SCr of 0.52 mg/dL (L)). Liver Function Tests: Recent Labs  Lab 02/24/20 0933 02/25/20 0556 02/26/20 0528  AST 249* 130* 66*  ALT 289* 206* 138*  ALKPHOS 300* 271* 220*  BILITOT 3.4* 3.3* 2.1*  PROT 7.7 6.6 6.1*  ALBUMIN 3.9 3.1* 3.0*   Recent Labs  Lab 02/24/20 0933 02/25/20 1742 02/26/20 0528  LIPASE 4,135* 322* 48   No results for input(s): AMMONIA in the last 168 hours. Coagulation Profile: No results for input(s): INR, PROTIME in the last 168 hours. Cardiac Enzymes: No results for input(s): CKTOTAL, CKMB, CKMBINDEX, TROPONINI in the last 168 hours. BNP (last 3 results) No results for input(s): PROBNP in the last 8760 hours. HbA1C: No results for input(s): HGBA1C in the last 72 hours. CBG: Recent Labs  Lab 02/24/20 2106 02/25/20 0005 02/25/20 0416 02/25/20 0756 02/25/20 2107  GLUCAP 99 78 81 77 64*   Lipid Profile: Recent Labs    02/25/20 0556  CHOL 190  HDL 69  LDLCALC 111*  TRIG 49  CHOLHDL 2.8   Thyroid Function Tests: No results for input(s): TSH, T4TOTAL, FREET4, T3FREE, THYROIDAB in the last 72 hours. Anemia Panel: No results for input(s): VITAMINB12, FOLATE, FERRITIN, TIBC, IRON, RETICCTPCT in the last 72 hours. Urine analysis:    Component Value Date/Time   COLORURINE AMBER (A)  02/24/2020 0933   APPEARANCEUR CLEAR 02/24/2020 0933   LABSPEC 1.010 02/24/2020 0933   PHURINE 5.0 02/24/2020 0933   GLUCOSEU NEGATIVE 02/24/2020 0933   GLUCOSEU NEGATIVE 08/30/2008 0000   HGBUR NEGATIVE 02/24/2020 0933   BILIRUBINUR NEGATIVE 02/24/2020 0933   KETONESUR NEGATIVE 02/24/2020 0933   PROTEINUR NEGATIVE 02/24/2020 0933   UROBILINOGEN 0.2 mg/dL 08/30/2008 0000   NITRITE NEGATIVE 02/24/2020 0933   LEUKOCYTESUR NEGATIVE 02/24/2020 0933     Miguel Thomas M.D. [facility] 02/26/2020, 10:55 AM   Call night coverage person covering after 7pm

## 2020-02-26 NOTE — Anesthesia Procedure Notes (Signed)
Procedure Name: Intubation Performed by: Gean Maidens, CRNA Pre-anesthesia Checklist: Patient identified, Emergency Drugs available, Patient being monitored, Suction available and Timeout performed Patient Re-evaluated:Patient Re-evaluated prior to induction Oxygen Delivery Method: Circle system utilized Preoxygenation: Pre-oxygenation with 100% oxygen Induction Type: IV induction Ventilation: Mask ventilation without difficulty Laryngoscope Size: Mac and 4 Grade View: Grade I Tube type: Oral Tube size: 7.5 mm Number of attempts: 1 Airway Equipment and Method: Stylet Placement Confirmation: ETT inserted through vocal cords under direct vision,  positive ETCO2 and breath sounds checked- equal and bilateral Secured at: 23 cm Tube secured with: Tape Dental Injury: Teeth and Oropharynx as per pre-operative assessment

## 2020-02-26 NOTE — Consult Note (Signed)
Miguel Thomas 1941/06/19  [id number].    Requesting MD: Dr. Estill Cotta Chief Complaint/Reason for Consult: gallstone pancreatitis  HPI:  This is an 79 yo white male with a history of GERD, melanoma, esophageal stricture who had COVID in January of 2021 and his wife subsequently passed away likely due to [place] per the patient.  On Friday night he began having some abdominal pain across his upper abdomen.  It persisted and worsened by Saturday.  He denies N/V until his first and only episode in the ED waiting room.  He denies an fevers, chills, diarrhea, SOB, CP, etc.  Upon arrival to the [facility] he was found to have a lipase over 4000 and an elevated TB.  His CT scan did not show evidence of choledocholithiasis and an MRCP was ordered which revealed multiple stones in his CBD.  GI Was consulted and he underwent an ERCP today with multiple stones removed.  The patient is currently asymptomatic and wanting to drink.  We have been asked to see the patient for a lap chole.  ROS: ROS: Please see HPI, otherwise all other systems have been reviewed and are negative.  Family History  Problem Relation Age of Onset  . Coronary artery disease Other   . Heart disease Other        rheumatic  . Colon cancer Neg Hx   . Esophageal cancer Neg Hx   . Rectal cancer Neg Hx   . Stomach cancer Neg Hx     Past Medical History:  Diagnosis Date  . Anemia   . Cancer ([place]) 10/2019   melanoma on scalp  . Dyspnea    had positive covid test 11-04-19  . GERD (gastroesophageal reflux disease) 12-12-04   ring like esophageal stricture with reflux esophagitis, also in 2011  . Hiatal hernia   . History of adenomatous polyp of colon    tubular adenom's in 2006;  2011; 03-05-2015  . History of esophageal stricture    s/p  dilatation 2006  and 2012  . Osteopenia   . Penile ulcer   . Plantar fasciitis   . Prostate nodule   . Pseudophakia of both eyes   . Wears hearing aid in both ears     Past Surgical  History:  Procedure Laterality Date  . APPLICATION OF A-CELL OF EXTREMITY N/A 12/05/2019   Procedure: APPLICATION OF A-CELL;  Surgeon: Stark Klein, MD;  Location: [place];  Service: General;  Laterality: N/A;  . CARDIOVASCULAR STRESS TEST  06/11/2011   normal nuclear study w/ no ischemia/  normal LV function and wall motion, ef 58%  . CATARACT EXTRACTION W/ INTRAOCULAR LENS  IMPLANT, BILATERAL  2008 approx.  . COLONOSCOPY  last one 03-05-2015  . ESOPHAGOGASTRODUODENOSCOPY  last one 01-06-2011  . INGUINAL HERNIA REPAIR Right 1990's  . MELANOMA EXCISION N/A 12/05/2019   Procedure: WIDE LOCAL EXCISION MELANOMA EXCISION OF SCALP;  Surgeon: Stark Klein, MD;  Location: [place];  Service: General;  Laterality: N/A;  . PENILE BIOPSY N/A 03/12/2017   Procedure: EXCISIONAL GLANS PENILE BIOPSY;  Surgeon: Festus Aloe, MD;  Location: [facility];  Service: Urology;  Laterality: N/A;  . PROSTATE BIOPSY  11/2011   normal  . SKIN FULL THICKNESS GRAFT Left 12/05/2019   Procedure: SKIN GRAFT FULL THICKNESS FROM LEFT UPPER CHEST TO SCALP;  Surgeon: Stark Klein, MD;  Location: [place];  Service: General;  Laterality: Left;  . TONSILLECTOMY  child    Social History:  reports that he has never smoked. He has never used smokeless tobacco. He reports that he does not drink alcohol or use drugs.  Allergies: No Known Allergies  Medications Prior to Admission  Medication Sig Dispense Refill  . Ascorbic Acid (VITAMIN C) 1000 MG tablet Take 1,000 mg by mouth daily.    Marland Kitchen omeprazole (PRILOSEC) 40 MG capsule TAKE 1 CAPSULE BY MOUTH ONCE DAILY BEFORE BREAKFAST 30 capsule 11  . Turmeric 500 MG CAPS Take 1 capsule by mouth daily.     . vitamin B-12 (CYANOCOBALAMIN) 500 MCG tablet Take 500 mcg by mouth daily.    Marland Kitchen zinc gluconate 50 MG tablet Take 50 mg by mouth daily.       Physical Exam: Blood pressure 137/76, pulse 70, temperature 98 F  (36.7 C), temperature source Oral, resp. rate 17, height 5\' 11"  (1.803 m), weight 81.6 kg, SpO2 94 %. General: pleasant, WD, WN, elderly white male who is laying in bed in NAD HEENT: head is normocephalic, atraumatic, except he has a bandage in place over the top portion of his head from previous melanoma excision.  Sclera are noninjected.  PERRL.  Ears and nose without any masses or lesions.  Mouth is pink and moist Heart: regular, rate, and rhythm.  Normal s1,s2. No obvious murmurs, gallops, or rubs noted.  Palpable radial and pedal pulses bilaterally Lungs: CTAB, no wheezes, rhonchi, or rales noted.  Respiratory effort nonlabored Abd: soft, NT, ND, +BS, no masses, hernias, or organomegaly MS: all 4 extremities are symmetrical with no cyanosis, clubbing, or edema. Skin: warm and dry with no masses, lesions, or rashes Neuro: Cranial nerves 2-12 grossly intact, sensation is normal throughout Psych: A&Ox3 with an appropriate affect.   Results for orders placed or performed during the hospital encounter of 02/24/20 (from the past 48 hour(s))  Creatinine, serum     Status: None   Collection Time: 02/24/20  3:10 PM  Result Value Ref Range   Creatinine, Ser 0.81 0.61 - 1.24 mg/dL   GFR calc non Af Amer >60 >60 mL/min   GFR calc Af Amer >60 >60 mL/min    Comment: Performed at [facility], [place] [address], [place], [place] [address]  Glucose, capillary     Status: None   Collection Time: 02/24/20  9:06 PM  Result Value Ref Range   Glucose-Capillary 99 70 - 99 mg/dL    Comment: Glucose reference range applies only to samples taken after fasting for at least 8 hours.  Glucose, capillary     Status: None   Collection Time: 02/25/20 12:05 AM  Result Value Ref Range   Glucose-Capillary 78 70 - 99 mg/dL    Comment: Glucose reference range applies only to samples taken after fasting for at least 8 hours.  Glucose, capillary     Status: None   Collection Time: 02/25/20  4:16 AM    Result Value Ref Range   Glucose-Capillary 81 70 - 99 mg/dL    Comment: Glucose reference range applies only to samples taken after fasting for at least 8 hours.  Comprehensive metabolic panel     Status: Abnormal   Collection Time: 02/25/20  5:56 AM  Result Value Ref Range   Sodium 141 135 - 145 mmol/L   Potassium 3.6 3.5 - 5.1 mmol/L   Chloride 110 98 - 111 mmol/L   CO2 25 22 - 32 mmol/L   Glucose, Bld 89 70 - 99 mg/dL  Comment: Glucose reference range applies only to samples taken after fasting for at least 8 hours.   BUN 17 8 - 23 mg/dL   Creatinine, Ser 0.61 0.61 - 1.24 mg/dL   Calcium 8.3 (L) 8.9 - 10.3 mg/dL   Total Protein 6.6 6.5 - 8.1 g/dL   Albumin 3.1 (L) 3.5 - 5.0 g/dL   AST 130 (H) 15 - 41 U/L   ALT 206 (H) 0 - 44 U/L   Alkaline Phosphatase 271 (H) 38 - 126 U/L   Total Bilirubin 3.3 (H) 0.3 - 1.2 mg/dL   GFR calc non Af Amer >60 >60 mL/min   GFR calc Af Amer >60 >60 mL/min   Anion gap 6 5 - 15    Comment: Performed at [facility], [place] [address] [place], [place] [address]  CBC     Status: Abnormal   Collection Time: 02/25/20  5:56 AM  Result Value Ref Range   WBC 7.2 4.0 - 10.5 K/uL   RBC 3.60 (L) 4.22 - 5.81 MIL/uL   Hemoglobin 11.7 (L) 13.0 - 17.0 g/dL   HCT 36.3 (L) 39.0 - 52.0 %   MCV 100.8 (H) 80.0 - 100.0 fL   MCH 32.5 26.0 - 34.0 pg   MCHC 32.2 30.0 - 36.0 g/dL   RDW 14.2 11.5 - 15.5 %   Platelets 165 150 - 400 K/uL   nRBC 0.0 0.0 - 0.2 %    Comment: Performed at [facility], [place] [address], [place], [place] [address]  Magnesium     Status: None   Collection Time: 02/25/20  5:56 AM  Result Value Ref Range   Magnesium 2.0 1.7 - 2.4 mg/dL    Comment: Performed at [facility], [place] [address], [place], [place] [address]  Lipid panel     Status: Abnormal   Collection Time: 02/25/20  5:56 AM  Result Value Ref Range   Cholesterol 190 0 - 200 mg/dL   Triglycerides 49 <150 mg/dL   HDL 69 >40  mg/dL   Total CHOL/HDL Ratio 2.8 RATIO   VLDL 10 0 - 40 mg/dL   LDL Cholesterol 111 (H) 0 - 99 mg/dL    Comment:        Total Cholesterol/HDL:CHD Risk Coronary Heart Disease Risk Table                     Men   Women  1/2 Average Risk   3.4   3.3  Average Risk       5.0   4.4  2 X Average Risk   9.6   7.1  3 X Average Risk  23.4   11.0        Use the calculated Patient Ratio above and the CHD Risk Table to determine the patient's CHD Risk.        ATP III CLASSIFICATION (LDL):  <100     mg/dL   Optimal  100-129  mg/dL   Near or Above                    Optimal  130-159  mg/dL   Borderline  160-189  mg/dL   High  >190     mg/dL   Very High Performed at [place] [address] [place], [place] [address]   Glucose, capillary     Status: None   Collection Time: 02/25/20  7:56 AM  Result Value Ref Range  Glucose-Capillary 77 70 - 99 mg/dL    Comment: Glucose reference range applies only to samples taken after fasting for at least 8 hours.   Comment 1 Notify RN   Lipase, blood     Status: Abnormal   Collection Time: 02/25/20  5:42 PM  Result Value Ref Range   Lipase 322 (H) 11 - 51 U/L    Comment: Performed at [facility], [place] [address], [place], [place] [address]  Glucose, capillary     Status: Abnormal   Collection Time: 02/25/20  9:07 PM  Result Value Ref Range   Glucose-Capillary 64 (L) 70 - 99 mg/dL    Comment: Glucose reference range applies only to samples taken after fasting for at least 8 hours.   Comment 1 Notify RN    Comment 2 Document in Chart   CBC     Status: Abnormal   Collection Time: 02/26/20  5:28 AM  Result Value Ref Range   WBC 7.9 4.0 - 10.5 K/uL   RBC 3.56 (L) 4.22 - 5.81 MIL/uL   Hemoglobin 11.5 (L) 13.0 - 17.0 g/dL   HCT 35.4 (L) 39.0 - 52.0 %   MCV 99.4 80.0 - 100.0 fL   MCH 32.3 26.0 - 34.0 pg   MCHC 32.5 30.0 - 36.0 g/dL   RDW 13.8 11.5 - 15.5 %   Platelets 162 150 - 400 K/uL   nRBC 0.0 0.0 -  0.2 %    Comment: Performed at [facility], [place] [address], [place], [place] [address]  Comprehensive metabolic panel     Status: Abnormal   Collection Time: 02/26/20  5:28 AM  Result Value Ref Range   Sodium 140 135 - 145 mmol/L   Potassium 3.4 (L) 3.5 - 5.1 mmol/L   Chloride 108 98 - 111 mmol/L   CO2 24 22 - 32 mmol/L   Glucose, Bld 87 70 - 99 mg/dL    Comment: Glucose reference range applies only to samples taken after fasting for at least 8 hours.   BUN 11 8 - 23 mg/dL   Creatinine, Ser 0.52 (L) 0.61 - 1.24 mg/dL   Calcium 8.3 (L) 8.9 - 10.3 mg/dL   Total Protein 6.1 (L) 6.5 - 8.1 g/dL   Albumin 3.0 (L) 3.5 - 5.0 g/dL   AST 66 (H) 15 - 41 U/L   ALT 138 (H) 0 - 44 U/L   Alkaline Phosphatase 220 (H) 38 - 126 U/L   Total Bilirubin 2.1 (H) 0.3 - 1.2 mg/dL   GFR calc non Af Amer >60 >60 mL/min   GFR calc Af Amer >60 >60 mL/min   Anion gap 8 5 - 15    Comment: Performed at [facility], [place] [address] [place], [place] [address]  Lipase, blood     Status: None   Collection Time: 02/26/20  5:28 AM  Result Value Ref Range   Lipase 48 11 - 51 U/L    Comment: Performed at [facility], [place] [address], [place], [place] [address]   MR 3D Recon At Scanner  Result Date: 02/25/2020 CLINICAL DATA:  Pancreatitis suspected, dilated CBD, elevated LFTs, history of scalp melanoma EXAM: MRI ABDOMEN WITH CONTRAST (WITH MRCP) TECHNIQUE: Multiplanar multisequence MR imaging of the abdomen was performed following the administration of intravenous contrast. Heavily T2-weighted images of the biliary and pancreatic ducts were obtained, and three-dimensional MRCP images were rendered by post processing. CONTRAST:  31mL GADAVIST GADOBUTROL 1 MMOL/ML  IV SOLN COMPARISON:  CT abdomen pelvis, 02/24/2020, PET-CT, 12/25/2019 FINDINGS: Examination is generally limited by breath motion artifact. Lower chest: Trace bilateral pleural effusions. Hepatobiliary: No  mass or other parenchymal abnormality identified. Numerous tiny gallstones in the gallbladder. There are approximately 13 tiny gallstones measuring 3-4 mm stacked in the common bile duct to the ampulla (series 10, image 22). The common bile duct is mildly enlarged measuring up to 8 mm. Pancreas: Diffuse inflammatory stranding about the pancreas and adjacent retroperitoneum. No pancreatic ductal dilatation. No evidence of parenchymal hypoenhancement or fluid collection. Spleen:  Within normal limits in size and appearance. Adrenals/Urinary Tract: No masses identified. Exophytic fluid signal cyst of the right kidney. No evidence of hydronephrosis. Stomach/Bowel: Visualized portions within the abdomen are unremarkable. Vascular/Lymphatic: No pathologically enlarged lymph nodes identified. No abdominal aortic aneurysm demonstrated. Other:  None. Musculoskeletal: No suspicious bone lesions identified. IMPRESSION: 1. Numerous tiny gallstones in the gallbladder and approximately 13 gallstones measuring 3-4 mm mm stacked in the common bile duct to the ampulla. These calculi were not radiopaque by prior CT. 2. Diffuse inflammatory stranding about the pancreas and adjacent retroperitoneum, consistent with gallstone pancreatitis. No pancreatic ductal dilatation. No evidence of pancreatic parenchymal necrosis or acute pancreatic fluid collection. Electronically Signed   By: Eddie Candle M.D.   On: 02/25/2020 14:56   DG ERCP BILIARY & PANCREATIC DUCTS  Result Date: 02/26/2020 CLINICAL DATA:  Choledocholithiasis. EXAM: ERCP TECHNIQUE: Multiple spot images obtained with the fluoroscopic device and submitted for interpretation post-procedure. FLUOROSCOPY TIME:  Fluoroscopy Time:  8 minutes, 6 seconds Number of Acquired Spot Images: 16 COMPARISON:  MRI 02/25/2020 FINDINGS: Common bile duct was cannulated and retrograde cholangiogram was performed. Filling defects in the common bile duct that are compatible with stones. Common  bile duct is mildly dilated. Evidence for balloon sweep for stone removal. IMPRESSION: Choledocholithiasis with stone removal. These images were submitted for radiologic interpretation only. Please see the procedural report for the amount of contrast and the fluoroscopy time utilized. Electronically Signed   By: Markus Daft M.D.   On: 02/26/2020 13:37   MR ABDOMEN WITH MRCP W CONTRAST  Result Date: 02/25/2020 CLINICAL DATA:  Pancreatitis suspected, dilated CBD, elevated LFTs, history of scalp melanoma EXAM: MRI ABDOMEN WITH CONTRAST (WITH MRCP) TECHNIQUE: Multiplanar multisequence MR imaging of the abdomen was performed following the administration of intravenous contrast. Heavily T2-weighted images of the biliary and pancreatic ducts were obtained, and three-dimensional MRCP images were rendered by post processing. CONTRAST:  31mL GADAVIST GADOBUTROL 1 MMOL/ML IV SOLN COMPARISON:  CT abdomen pelvis, 02/24/2020, PET-CT, 12/25/2019 FINDINGS: Examination is generally limited by breath motion artifact. Lower chest: Trace bilateral pleural effusions. Hepatobiliary: No mass or other parenchymal abnormality identified. Numerous tiny gallstones in the gallbladder. There are approximately 13 tiny gallstones measuring 3-4 mm stacked in the common bile duct to the ampulla (series 10, image 22). The common bile duct is mildly enlarged measuring up to 8 mm. Pancreas: Diffuse inflammatory stranding about the pancreas and adjacent retroperitoneum. No pancreatic ductal dilatation. No evidence of parenchymal hypoenhancement or fluid collection. Spleen:  Within normal limits in size and appearance. Adrenals/Urinary Tract: No masses identified. Exophytic fluid signal cyst of the right kidney. No evidence of hydronephrosis. Stomach/Bowel: Visualized portions within the abdomen are unremarkable. Vascular/Lymphatic: No pathologically enlarged lymph nodes identified. No abdominal aortic aneurysm demonstrated. Other:  None.  Musculoskeletal: No suspicious bone lesions identified. IMPRESSION: 1. Numerous tiny gallstones in the gallbladder and approximately 13 gallstones measuring 3-4 mm mm  stacked in the common bile duct to the ampulla. These calculi were not radiopaque by prior CT. 2. Diffuse inflammatory stranding about the pancreas and adjacent retroperitoneum, consistent with gallstone pancreatitis. No pancreatic ductal dilatation. No evidence of pancreatic parenchymal necrosis or acute pancreatic fluid collection. Electronically Signed   By: Eddie Candle M.D.   On: 02/25/2020 14:56      Assessment/Plan Melanoma GERD H/O Esophageal strictures  COVID January 2021  Gallstone pancreatitis The patient was found to have choledocholithiasis and underwent ERCP today with sphincterotomy.  He tolerated the procedure well.  His lipase today is 48 and his abdominal exam is benign.  I have discussed with his the reasoning behind why we like to proceed with a lap chole after ERCP for gallstone pancreatitis.  He seems to understand as best as I can tell.  We discussed the procedure and spoke about trying to proceed tomorrow with surgery.  He seems to agree.  I told him I would try and call his son to explain all of this as well.  He may have CLD tonight from my standpoint, but NPO p MN for possible OR tomorrow as scheduling allows.   FEN - NPO p MN VTE - heparin ID - cipro   Henreitta Cea, [facility] 02/26/2020, 3:01 PM Please see Amion for pager number during day hours 7:00am-4:30pm or 7:00am -11:30am on weekends

## 2020-02-27 ENCOUNTER — Encounter (HOSPITAL_COMMUNITY): Payer: Self-pay | Admitting: Internal Medicine

## 2020-02-27 ENCOUNTER — Inpatient Hospital Stay (HOSPITAL_COMMUNITY): Payer: Medicare Other | Admitting: Anesthesiology

## 2020-02-27 ENCOUNTER — Encounter (HOSPITAL_COMMUNITY)
Admission: EM | Disposition: A | Discharge status: Home Health | Payer: Self-pay | Source: Home / Self Care | Attending: Internal Medicine

## 2020-02-27 DIAGNOSIS — K851 Biliary acute pancreatitis without necrosis or infection: Principal | ICD-10-CM

## 2020-02-27 DIAGNOSIS — K8051 Calculus of bile duct without cholangitis or cholecystitis with obstruction: Secondary | ICD-10-CM

## 2020-02-27 HISTORY — PX: CHOLECYSTECTOMY: SHX55

## 2020-02-27 SURGERY — LAPAROSCOPIC CHOLECYSTECTOMY
Anesthesia: General

## 2020-02-27 MED ORDER — ACETAMINOPHEN 650 MG RE SUPP: 650.0000 mg | Freq: Four times a day (QID) | RECTAL | Status: DC | PRN

## 2020-02-27 MED ORDER — PROPOFOL 10 MG/ML IV BOLUS
INTRAVENOUS | Status: DC | PRN
  Administered 2020-02-27: 100 mg via INTRAVENOUS

## 2020-02-27 MED ORDER — LIDOCAINE 2% (20 MG/ML) 5 ML SYRINGE
INTRAMUSCULAR | Status: DC | PRN
  Administered 2020-02-27: 75 mg via INTRAVENOUS

## 2020-02-27 MED ORDER — ONDANSETRON 4 MG PO TBDP: 4.0000 mg | ORAL_TABLET | Freq: Four times a day (QID) | ORAL | Status: DC | PRN

## 2020-02-27 MED ORDER — BUPIVACAINE-EPINEPHRINE 0.5% -1:200000 IJ SOLN
INTRAMUSCULAR | Status: DC | PRN
  Administered 2020-02-27: 20 mL

## 2020-02-27 MED ORDER — ROCURONIUM BROMIDE 10 MG/ML (PF) SYRINGE
PREFILLED_SYRINGE | INTRAVENOUS | Status: DC | PRN
  Administered 2020-02-27: 60 mg via INTRAVENOUS
  Administered 2020-02-27 (×2): 10 mg via INTRAVENOUS

## 2020-02-27 MED ORDER — ACETAMINOPHEN 10 MG/ML IV SOLN: 1000.0000 mg | Freq: Once | INTRAVENOUS | Status: DC | PRN

## 2020-02-27 MED ORDER — ONDANSETRON HCL 4 MG/2ML IJ SOLN
INTRAMUSCULAR | Status: DC | PRN
  Administered 2020-02-27: 4 mg via INTRAVENOUS

## 2020-02-27 MED ORDER — DEXAMETHASONE SODIUM PHOSPHATE 10 MG/ML IJ SOLN
INTRAMUSCULAR | Status: DC | PRN
  Administered 2020-02-27: 5 mg via INTRAVENOUS

## 2020-02-27 MED ORDER — ONDANSETRON HCL 4 MG/2ML IJ SOLN
INTRAMUSCULAR | Status: AC
  Filled 2020-02-27: qty 2

## 2020-02-27 MED ORDER — OXYCODONE HCL 5 MG PO TABS: 5.0000 mg | ORAL_TABLET | ORAL | Status: DC | PRN

## 2020-02-27 MED ORDER — HYDROMORPHONE HCL 1 MG/ML IJ SOLN
1.0000 mg | INTRAMUSCULAR | Status: DC | PRN
  Administered 2020-02-27 – 2020-02-28 (×2): 1 mg via INTRAVENOUS
  Filled 2020-02-27 (×3): qty 1

## 2020-02-27 MED ORDER — ONDANSETRON HCL 4 MG/2ML IJ SOLN: 4.0000 mg | Freq: Four times a day (QID) | INTRAMUSCULAR | Status: DC | PRN

## 2020-02-27 MED ORDER — ACETAMINOPHEN 325 MG PO TABS: 650.0000 mg | ORAL_TABLET | Freq: Four times a day (QID) | ORAL | Status: DC | PRN

## 2020-02-27 MED ORDER — ONDANSETRON HCL 4 MG/2ML IJ SOLN: 4.0000 mg | Freq: Once | INTRAMUSCULAR | Status: DC | PRN

## 2020-02-27 MED ORDER — FENTANYL CITRATE (PF) 250 MCG/5ML IJ SOLN
INTRAMUSCULAR | Status: AC
  Filled 2020-02-27: qty 5

## 2020-02-27 MED ORDER — SUGAMMADEX SODIUM 200 MG/2ML IV SOLN
INTRAVENOUS | Status: DC | PRN
  Administered 2020-02-27: 175 mg via INTRAVENOUS

## 2020-02-27 MED ORDER — FENTANYL CITRATE (PF) 250 MCG/5ML IJ SOLN
INTRAMUSCULAR | Status: DC | PRN
  Administered 2020-02-27 (×5): 50 ug via INTRAVENOUS

## 2020-02-27 MED ORDER — KCL IN DEXTROSE-NACL 20-5-0.45 MEQ/L-%-% IV SOLN
INTRAVENOUS | Status: DC
  Filled 2020-02-27: qty 1000

## 2020-02-27 MED ORDER — TRAMADOL HCL 50 MG PO TABS: 50.0000 mg | ORAL_TABLET | Freq: Four times a day (QID) | ORAL | Status: DC | PRN

## 2020-02-27 MED ORDER — FENTANYL CITRATE (PF) 100 MCG/2ML IJ SOLN: 25.0000 ug | INTRAMUSCULAR | Status: DC | PRN

## 2020-02-27 MED ORDER — BUPIVACAINE-EPINEPHRINE (PF) 0.5% -1:200000 IJ SOLN
INTRAMUSCULAR | Status: AC
  Filled 2020-02-27: qty 30

## 2020-02-27 SURGICAL SUPPLY — 41 items
ADH SKN CLS APL DERMABOND .7 (GAUZE/BANDAGES/DRESSINGS) ×1
APL PRP STRL LF DISP 70% ISPRP (MISCELLANEOUS) ×1
APPLIER CLIP ROT 10 11.4 M/L (STAPLE) ×2
APR CLP MED LRG 11.4X10 (STAPLE) ×1
BAG SPEC RTRVL LRG 6X4 10 (ENDOMECHANICALS) ×1
CABLE HIGH FREQUENCY MONO STRZ (ELECTRODE) ×2 IMPLANT
CHLORAPREP W/TINT 26 (MISCELLANEOUS) ×2 IMPLANT
CLIP APPLIE ROT 10 11.4 M/L (STAPLE) ×1 IMPLANT
COVER MAYO STAND STRL (DRAPES) IMPLANT
COVER SURGICAL LIGHT HANDLE (MISCELLANEOUS) ×2 IMPLANT
COVER WAND RF STERILE (DRAPES) IMPLANT
DECANTER SPIKE VIAL GLASS SM (MISCELLANEOUS) ×2 IMPLANT
DERMABOND ADVANCED (GAUZE/BANDAGES/DRESSINGS) ×1
DERMABOND ADVANCED .7 DNX12 (GAUZE/BANDAGES/DRESSINGS) IMPLANT
DRAIN CHANNEL 19F RND (DRAIN) ×1 IMPLANT
DRAPE C-ARM 42X120 X-RAY (DRAPES) IMPLANT
ELECT REM PT RETURN 15FT ADLT (MISCELLANEOUS) ×2 IMPLANT
EVACUATOR SILICONE 100CC (DRAIN) ×1 IMPLANT
GLOVE SURG ORTHO 8.0 STRL STRW (GLOVE) ×2 IMPLANT
GOWN STRL REUS W/TWL XL LVL3 (GOWN DISPOSABLE) ×4 IMPLANT
HEMOSTAT SNOW SURGICEL 2X4 (HEMOSTASIS) ×1 IMPLANT
HEMOSTAT SURGICEL 4X8 (HEMOSTASIS) IMPLANT
KIT BASIN (CUSTOM PROCEDURE TRAY) ×2 IMPLANT
KIT TURNOVER KIT A (KITS) IMPLANT
PAD POSITIONING PINK XL (MISCELLANEOUS) IMPLANT
PENCIL SMOKE EVACUATOR (MISCELLANEOUS) IMPLANT
POUCH SPECIMEN RETRIEVAL 10MM (ENDOMECHANICALS) ×2 IMPLANT
SCISSORS LAP 5X35 DISP (ENDOMECHANICALS) ×2 IMPLANT
SET CHOLANGIOGRAPH MIX (MISCELLANEOUS) ×2 IMPLANT
SET IRRIG TUBING LAPAROSCOPIC (IRRIGATION / IRRIGATOR) ×2 IMPLANT
SET TUBE SMOKE EVAC HIGH FLOW (TUBING) ×2 IMPLANT
SLEEVE XCEL OPT CAN 5 100 (ENDOMECHANICALS) ×2 IMPLANT
STRIP CLOSURE SKIN 1/2X4 (GAUZE/BANDAGES/DRESSINGS) ×2 IMPLANT
SUT ETHILON 2 0 PS N (SUTURE) ×1 IMPLANT
SUT VIC AB 4-0 PS2 27 (SUTURE) ×2 IMPLANT
TAPE CLOTH 4X10 WHT NS (GAUZE/BANDAGES/DRESSINGS) IMPLANT
TOWEL OR 17X26 10 PK STRL BLUE (TOWEL DISPOSABLE) ×2 IMPLANT
TRAY LAPAROSCOPIC (CUSTOM PROCEDURE TRAY) ×2 IMPLANT
TROCAR BLADELESS OPT 5 100 (ENDOMECHANICALS) ×2 IMPLANT
TROCAR XCEL BLUNT TIP 100MML (ENDOMECHANICALS) ×2 IMPLANT
TROCAR XCEL NON-BLD 11X100MML (ENDOMECHANICALS) ×2 IMPLANT

## 2020-02-27 NOTE — Transfer of Care (Signed)
Immediate Anesthesia Transfer of Care Note  Patient: Miguel Thomas  Procedure(s) Performed: LAPAROSCOPIC CHOLECYSTECTOMY (N/A )  Patient Location: PACU  Anesthesia Type:General  Level of Consciousness: awake, sedated and responds to stimulation  Airway & Oxygen Therapy: Patient Spontanous Breathing and Patient connected to face mask oxygen  Post-op Assessment: Report given to RN and Post -op Vital signs reviewed and stable  Post vital signs: Reviewed and stable  Last Vitals:  Vitals Value Taken Time  BP 139/70 02/27/20 1715  Temp    Pulse 72 02/27/20 1716  Resp 13 02/27/20 1716  SpO2 100 % 02/27/20 1716  Vitals shown include unvalidated device data.  Last Pain:  Vitals:   02/27/20 1408  TempSrc: Oral  PainSc:       Patients Stated Pain Goal: 2 ([id number])  Complications: No apparent anesthesia complications

## 2020-02-27 NOTE — Anesthesia Preprocedure Evaluation (Addendum)
Anesthesia Evaluation  Patient identified by MRN, date of birth, ID band Patient awake    Reviewed: Allergy & Precautions, NPO status , Patient's Chart, lab work & pertinent test results  Airway Mallampati: I  TM Distance: >3 FB Neck ROM: Full    Dental no notable dental hx.    Pulmonary neg pulmonary ROS,    Pulmonary exam normal breath sounds clear to auscultation       Cardiovascular negative cardio ROS Normal cardiovascular exam Rhythm:Regular Rate:Normal     Neuro/Psych negative neurological ROS  negative psych ROS   GI/Hepatic Neg liver ROS, hiatal hernia, GERD  Medicated and Controlled,  Endo/Other  negative endocrine ROS  Renal/GU negative Renal ROS     Musculoskeletal negative musculoskeletal ROS (+)   Abdominal   Peds  Hematology  (+) anemia ,   Anesthesia Other Findings BILIARY PANCREATITIS  Reproductive/Obstetrics                            Anesthesia Physical Anesthesia Plan  ASA: II  Anesthesia Plan: General   Post-op Pain Management:    Induction: Intravenous  PONV Risk Score and Plan: 2 and Ondansetron, Dexamethasone and Treatment may vary due to age or medical condition  Airway Management Planned: Oral ETT  Additional Equipment:   Intra-op Plan:   Post-operative Plan: Extubation in OR  Informed Consent: I have reviewed the patients History and Physical, chart, labs and discussed the procedure including the risks, benefits and alternatives for the proposed anesthesia with the patient or authorized representative who has indicated his/her understanding and acceptance.     Dental advisory given  Plan Discussed with: CRNA  Anesthesia Plan Comments:        Anesthesia Quick Evaluation

## 2020-02-27 NOTE — Op Note (Signed)
Operative Note  Pre-operative Diagnosis:  Biliary pancreatitis, cholelithiasis  Post-operative Diagnosis:  same  Surgeon:  [name], MD  Assistant:  none   Procedure:  Lap cholecystectomy  Anesthesia:  general  Estimated Blood Loss:  150 cc  Drains: 82 Fr Blake drain to subhepatic space         Specimen: gallbladder to pathology  Indications: Patient is an 79 year old male admitted with biliary pancreatitis.  Patient underwent ERCP with stone extraction on February 26, 2020.  Patient now comes to surgery for cholecystectomy.  Procedure:  The patient was seen in the pre-op holding area. The risks, benefits, complications, treatment options, and expected outcomes were previously discussed with the patient. The patient agreed with the proposed plan and has signed the informed consent form.  The patient was brought to the operating room by the surgical team, identified as Miguel Thomas and the procedure verified. A "time out" was completed and the above information confirmed.  Following administration of general anesthesia the patient was positioned and then prepped and draped in the usual aseptic fashion.  After ascertaining that an adequate level of anesthesia had been achieved, an infraumbilical incision was made in the midline with a #15 blade.  Dissection was carried through subcutaneous tissues.  Fascia is incised in the midline.  Peritoneal cavity is entered cautiously.  0 Vicryl pursestring sutures placed in the fascia.  An Hassan cannula is introduced under direct vision and secured with the pursestring suture.  Abdomen is insufflated with carbon dioxide.  Laparoscope was introduced and the abdomen explored.  Operative ports were placed along the right costal margin in the midline, midclavicular line, and anterior axillary line.  Liver appears to have some mild hepatocellular disease changes.  The liver is elevated and the gallbladder is visualized.  It is quite pale and essentially a  solid concretion of gallstones.  There is no fluid within the gallbladder on attempts at aspiration.  With difficulty, the gallbladder is grasped and retracted cephalad.  Dissection is begun at the neck of the gallbladder.  Peritoneum is incised.  Anterior branch of the cystic artery is dissected out, doubly clipped, and divided.  Cystic duct is dissected out.  There is a posterior branch of the cystic artery which is also dissected out, doubly clipped, and divided.  Cystic duct is triply clipped and then divided.  Gallbladder is then mobilized from the gallbladder bed.  There is moderate bleeding from the gallbladder bed which is controlled with use of the electrocautery.  Two additional ligaclips are placed on venous vessels within the gallbladder bed. Good hemostasis was eventually achieved. A full sheet of Surgicel snow was placed in the gallbladder bed.  Right upper quadrant was copiously irrigated and evacuated. All blood clots were evacuated. Various stones were retrieved that had been spilled from the gallbladder. Gallbladder was then placed into an Endo Catch bag and withdrawn through the umbilical port site without difficulty. The gallbladder was essentially entire concretion of stones with no liquid bile present. It was submitted to pathology for review.  A 19 [place] Blake drain is brought in through the lateral port site. It is secured to the skin with a 2-0 nylon suture. Drain is placed in the subhepatic space. It is placed to bulb suction.  Pneumoperitoneum is released and all ports are removed. 0 Vicryl pursestring sutures tied securely at the umbilical port. Port sites are anesthetized with local anesthetic. Skin incisions are closed with interrupted 4-0 Monocryl subcuticular sutures. Wounds are washed  and dried and Dermabond is applied as dressing.  Patient is awakened from anesthesia and transported to the recovery room. The patient tolerated the procedure well.   [name],  MD [facility] Office: [phone]

## 2020-02-27 NOTE — Anesthesia Procedure Notes (Signed)
Procedure Name: Intubation Date/Time: 02/27/2020 3:35 PM Performed by: Lollie Sails, CRNA Pre-anesthesia Checklist: Patient identified, Emergency Drugs available, Suction available, Patient being monitored and Timeout performed Patient Re-evaluated:Patient Re-evaluated prior to induction Oxygen Delivery Method: Circle system utilized Preoxygenation: Pre-oxygenation with 100% oxygen Induction Type: IV induction Ventilation: Mask ventilation without difficulty Laryngoscope Size: Miller and 3 Grade View: Grade I Tube type: Oral Tube size: 8.0 mm Number of attempts: 1 Airway Equipment and Method: Stylet Placement Confirmation: ETT inserted through vocal cords under direct vision,  positive ETCO2 and breath sounds checked- equal and bilateral Secured at: 23 cm Tube secured with: Tape Dental Injury: Teeth and Oropharynx as per pre-operative assessment

## 2020-02-27 NOTE — Progress Notes (Signed)
PROGRESS NOTE    Miguel Thomas  [id number] DOB: 09/16/1941 DOA: 02/24/2020 PCP: [name], NP    Brief Narrative:  Miguel Thomas is a 79 year old male with history of GERD, chronic reflux, dysphagia, melanoma scalp, COVID-19 in January 2021 presented with nausea, vomiting, abdominal pain. Patient was found to have elevated LFTs, lipase of 4135.  CT abdomen showed acute pancreatitis without necrosis, CBD dilatation, GI was consulted. Patient was admitted for further work-up.   Assessment & Plan:   Principal Problem:   Acute pancreatitis Active Problems:   GERD   Elevated LFTs   Bile duct stone  Acute gallstone pancreatitis with obstruction of CBD Elevated LFTs Patient presenting with abdominal pain and was found to have elevated LFTs with a lipase of 4135.  CT abdomen/pelvis notable for acute pancreatitis without necrosis and CBD dilation.  MRCP notable for numerous tiny gallstones in the gallbladder approximately 13 gallstones measuring 3-4 mm stacked in the common bile duct to the ampulla with diffuse inflammatory stranding about the pancreas and adjacent retroperitoneum consistent with gallstone pancreatitis.  Underwent ERCP with biliary sphincterotomy and Keilman bile duct stone extraction on 02/26/2020. --General Surgery and [facility] following, appreciate assistance --AST 249>130>66 --ALT 289>206>138 --Tbili 3.4>3.3>2.1 --Lipasre [id number] --N.p.o., NS at 150 mL's per hour --General surgery plans cholecystectomy today  GERD -Continue IV PPI -Follows outpatient GI, Dr. Carlean Purl   DVT prophylaxis: SCDs Code Status: Full code Family Communication: Discussed with patient extensively at bedside this morning  Disposition Plan:  Status is: Inpatient  Remains inpatient appropriate because:Ongoing diagnostic testing needed not appropriate for outpatient work up, Unsafe d/c plan, IV treatments appropriate due to intensity of illness or inability to take PO and  Inpatient level of care appropriate due to severity of illness   Dispo: The patient is from: Home              Anticipated d/c is to: Home              Anticipated d/c date is: 1 day              Patient currently is not medically stable to d/c.   Consultants:    GI  General Surgery  Procedures:   ERCP 4/26  Antimicrobials:   Ciprofloxacin 4/25>>   Subjective: Examined bedside, resting comfortably.  In the waiting for surgery planned this afternoon.  No specific complaints.  Denies headache, no fever/chills/night sweats, no nausea/vomiting/diarrhea, no chest pain, no palpitations, no abdominal pain.  No acute events overnight per nursing staff.  Objective: Vitals:   02/26/20 2154 02/27/20 0155 02/27/20 0537 02/27/20 1408  BP: 113/60 118/66 115/61 135/85  Pulse: 76 77 71 79  Resp: 16 16 16 18   Temp: 98.7 F (37.1 C) 98.4 F (36.9 C) 98.5 F (36.9 C) 98.4 F (36.9 C)  TempSrc: Oral Oral Oral Oral  SpO2: 96% 97% 97% 99%  Weight:      Height:        Intake/Output Summary (Last 24 hours) at 02/27/2020 1644 Last data filed at 02/27/2020 1643 Gross per 24 hour  Intake --  Output 1500 ml  Net -1500 ml   Filed Weights   02/24/20 0703 02/26/20 1046  Weight: 81.6 kg 81.6 kg    Examination:  General exam: Appears calm and comfortable  Respiratory system: Clear to auscultation. Respiratory effort normal. Cardiovascular system: S1 & S2 heard, RRR. No JVD, murmurs, rubs, gallops or clicks. No pedal edema. Gastrointestinal system: Abdomen is nondistended, soft  and nontender. No organomegaly or masses felt. Normal bowel sounds heard. Central nervous system: Alert and oriented. No focal neurological deficits. Extremities: Symmetric 5 x 5 power. Skin: No rashes, lesions or ulcers, noted dressing over scalp Psychiatry: Judgement and insight appear normal. Mood & affect appropriate.     Data Reviewed: I have personally reviewed following labs and imaging  studies  CBC: Recent Labs  Lab 02/24/20 0933 02/25/20 0556 02/26/20 0528  WBC 7.4 7.2 7.9  HGB 14.1 11.7* 11.5*  HCT 43.6 36.3* 35.4*  MCV 101.2* 100.8* 99.4  PLT 220 165 0000000   Basic Metabolic Panel: Recent Labs  Lab 02/24/20 0933 02/24/20 1510 02/25/20 0556 02/26/20 0528  NA 139  --  141 140  K 4.2  --  3.6 3.4*  CL 107  --  110 108  CO2 21*  --  25 24  GLUCOSE 155*  --  89 87  BUN 17  --  17 11  CREATININE 0.87 0.81 0.61 0.52*  CALCIUM 8.8*  --  8.3* 8.3*  MG  --   --  2.0  --    GFR: Estimated Creatinine Clearance: 77.1 mL/min (A) (by C-G formula based on SCr of 0.52 mg/dL (L)). Liver Function Tests: Recent Labs  Lab 02/24/20 0933 02/25/20 0556 02/26/20 0528  AST 249* 130* 66*  ALT 289* 206* 138*  ALKPHOS 300* 271* 220*  BILITOT 3.4* 3.3* 2.1*  PROT 7.7 6.6 6.1*  ALBUMIN 3.9 3.1* 3.0*   Recent Labs  Lab 02/24/20 0933 02/25/20 1742 02/26/20 0528  LIPASE 4,135* 322* 48   No results for input(s): AMMONIA in the last 168 hours. Coagulation Profile: No results for input(s): INR, PROTIME in the last 168 hours. Cardiac Enzymes: No results for input(s): CKTOTAL, CKMB, CKMBINDEX, TROPONINI in the last 168 hours. BNP (last 3 results) No results for input(s): PROBNP in the last 8760 hours. HbA1C: No results for input(s): HGBA1C in the last 72 hours. CBG: Recent Labs  Lab 02/24/20 2106 02/25/20 0005 02/25/20 0416 02/25/20 0756 02/25/20 2107  GLUCAP 99 78 81 77 64*   Lipid Profile: Recent Labs    02/25/20 0556  CHOL 190  HDL 69  LDLCALC 111*  TRIG 49  CHOLHDL 2.8   Thyroid Function Tests: No results for input(s): TSH, T4TOTAL, FREET4, T3FREE, THYROIDAB in the last 72 hours. Anemia Panel: No results for input(s): VITAMINB12, FOLATE, FERRITIN, TIBC, IRON, RETICCTPCT in the last 72 hours. Sepsis Labs: No results for input(s): PROCALCITON, LATICACIDVEN in the last 168 hours.  Recent Results (from the past 240 hour(s))  Respiratory Panel by  RT PCR (Flu A&B, Covid) - Nasopharyngeal Swab     Status: None   Collection Time: 02/24/20  1:27 PM   Specimen: Nasopharyngeal Swab  Result Value Ref Range Status   SARS Coronavirus 2 by RT PCR NEGATIVE NEGATIVE Final    Comment: (NOTE) SARS-CoV-2 target nucleic acids are NOT DETECTED. The SARS-CoV-2 RNA is generally detectable in upper respiratoy specimens during the acute phase of infection. The lowest concentration of SARS-CoV-2 viral copies this assay can detect is 131 copies/mL. A negative result does not preclude SARS-Cov-2 infection and should not be used as the sole basis for treatment or other patient management decisions. A negative result may occur with  improper specimen collection/handling, submission of specimen other than nasopharyngeal swab, presence of viral mutation(s) within the areas targeted by this assay, and inadequate number of viral copies (<131 copies/mL). A negative result must be combined with clinical observations,  patient history, and epidemiological information. The expected result is Negative. Fact Sheet for Patients:  PinkCheek.be Fact Sheet for Healthcare Providers:  GravelBags.it This test is not yet ap proved or cleared by the [place] FDA and  has been authorized for detection and/or diagnosis of SARS-CoV-2 by FDA under an Emergency Use Authorization (EUA). This EUA will remain  in effect (meaning this test can be used) for the duration of the COVID-19 declaration under Section 564(b)(1) of the Act, 21 U.S.C. section 360bbb-3(b)(1), unless the authorization is terminated or revoked sooner.    Influenza A by PCR NEGATIVE NEGATIVE Final   Influenza B by PCR NEGATIVE NEGATIVE Final    Comment: (NOTE) The Xpert Xpress SARS-CoV-2/FLU/RSV assay is intended as an aid in  the diagnosis of influenza from Nasopharyngeal swab specimens and  should not be used as a sole basis for treatment.  Nasal washings and  aspirates are unacceptable for Xpert Xpress SARS-CoV-2/FLU/RSV  testing. Fact Sheet for Patients: PinkCheek.be Fact Sheet for Healthcare Providers: GravelBags.it This test is not yet approved or cleared by the [place] FDA and  has been authorized for detection and/or diagnosis of SARS-CoV-2 by  FDA under an Emergency Use Authorization (EUA). This EUA will remain  in effect (meaning this test can be used) for the duration of the  Covid-19 declaration under Section 564(b)(1) of the Act, 21  U.S.C. section 360bbb-3(b)(1), unless the authorization is  terminated or revoked. Performed at [facility], [place] [address] [place], [place] [address]          Radiology Studies: DG ERCP BILIARY & PANCREATIC DUCTS  Result Date: 02/26/2020 CLINICAL DATA:  Choledocholithiasis. EXAM: ERCP TECHNIQUE: Multiple spot images obtained with the fluoroscopic device and submitted for interpretation post-procedure. FLUOROSCOPY TIME:  Fluoroscopy Time:  8 minutes, 6 seconds Number of Acquired Spot Images: 16 COMPARISON:  MRI 02/25/2020 FINDINGS: Common bile duct was cannulated and retrograde cholangiogram was performed. Filling defects in the common bile duct that are compatible with stones. Common bile duct is mildly dilated. Evidence for balloon sweep for stone removal. IMPRESSION: Choledocholithiasis with stone removal. These images were submitted for radiologic interpretation only. Please see the procedural report for the amount of contrast and the fluoroscopy time utilized. Electronically Signed   By: Markus Daft M.D.   On: 02/26/2020 13:37        Scheduled Meds: . [MAR Hold] pantoprazole (PROTONIX) IV  40 mg Intravenous Q24H   Continuous Infusions: . sodium chloride 150 mL/hr at 02/27/20 0107  . [MAR Hold] ciprofloxacin 400 mg (02/27/20 0618)  . lactated ringers 10 mL/hr at 02/27/20 1504     LOS:  3 days    Time spent: 35 minutes spent on chart review, discussion with nursing staff, consultants, updating family and interview/physical exam; more than 50% of that time was spent in counseling and/or coordination of care.    Maleka Contino J [place] DO [facility] Available via Epic secure chat 7am-7pm After these hours, please refer to coverage provider listed on [email or web address] 02/27/2020, 4:44 PM

## 2020-02-27 NOTE — Anesthesia Postprocedure Evaluation (Signed)
Anesthesia Post Note  Patient: Miguel Thomas  Procedure(s) Performed: LAPAROSCOPIC CHOLECYSTECTOMY (N/A )     Patient location during evaluation: PACU Anesthesia Type: General Level of consciousness: sedated Pain management: pain level controlled Vital Signs Assessment: post-procedure vital signs reviewed and stable Respiratory status: spontaneous breathing and respiratory function stable Cardiovascular status: stable Postop Assessment: no apparent nausea or vomiting Anesthetic complications: no    Last Vitals:  Vitals:   02/27/20 1730 02/27/20 1745  BP: 135/72 131/74  Pulse: 74 74  Resp: 14 12  Temp:    SpO2: 100% 100%    Last Pain:  Vitals:   02/27/20 1745  TempSrc:   PainSc: 0-No pain                 Jentri Aye DANIEL

## 2020-02-27 NOTE — Progress Notes (Signed)
Progress Note  CC:  pancreatitis        ASSESSMENT AND PLAN:   # Biliary pancreatitis / cholelithiasis.  --s/p ERCP with sphincterotomy and removal of several CBD stones yesterday --Liver tests continue to improve. Tbili 3.3 > 2.1. Alk phos and enzymes improved.  --Afebrile, WBC normal.  --CCS evaluated and looks like plan is for lap chole today.       SUBJECTIVE   No abdominal pain. Feels okay and awaiting cholecystectomy    OBJECTIVE:     Vital signs in last 24 hours: Temp:  [97.8 F (36.6 C)-99.1 F (37.3 C)] 98.5 F (36.9 C) (04/27 0537) Pulse Rate:  [70-80] 71 (04/27 0537) Resp:  [9-17] 16 (04/27 0537) BP: (104-156)/(60-92) 115/61 (04/27 0537) SpO2:  [94 %-100 %] 97 % (04/27 0537) Weight:  [81.6 kg] 81.6 kg (04/26 1046) Last BM Date: 02/24/20 General:   Alert, well-developed male in NAD. HOH Heart:  Regular rate and rhythm;  No lower extremity edema   Pulm: Normal respiratory effort   Abdomen:  Soft,  nontender, nondistended.  Normal bowel sounds.          Neurologic:  Alert and  oriented,  grossly normal neurologically. Psych:  Pleasant, cooperative.  Normal mood and affect.   Intake/Output from previous day: 04/26 0701 - 04/27 0700 In: 600 [I.V.:600] Out: 1875 [Urine:1875] Intake/Output this shift: No intake/output data recorded.  Lab Results: Recent Labs    02/24/20 0933 02/25/20 0556 02/26/20 0528  WBC 7.4 7.2 7.9  HGB 14.1 11.7* 11.5*  HCT 43.6 36.3* 35.4*  PLT 220 165 162   BMET Recent Labs    02/24/20 0933 02/24/20 0933 02/24/20 1510 02/25/20 0556 02/26/20 0528  NA 139  --   --  141 140  K 4.2  --   --  3.6 3.4*  CL 107  --   --  110 108  CO2 21*  --   --  25 24  GLUCOSE 155*  --   --  89 87  BUN 17  --   --  17 11  CREATININE 0.87   < > 0.81 0.61 0.52*  CALCIUM 8.8*  --   --  8.3* 8.3*   < > = values in this interval not displayed.   LFT Recent Labs    02/26/20 0528  PROT 6.1*  ALBUMIN 3.0*  AST 66*  ALT 138*    ALKPHOS 220*  BILITOT 2.1*   PT/INR No results for input(s): LABPROT, INR in the last 72 hours. Hepatitis Panel No results for input(s): HEPBSAG, HCVAB, HEPAIGM, HEPBIGM in the last 72 hours.  MR 3D Recon At Scanner  Result Date: 02/25/2020 CLINICAL DATA:  Pancreatitis suspected, dilated CBD, elevated LFTs, history of scalp melanoma EXAM: MRI ABDOMEN WITH CONTRAST (WITH MRCP) TECHNIQUE: Multiplanar multisequence MR imaging of the abdomen was performed following the administration of intravenous contrast. Heavily T2-weighted images of the biliary and pancreatic ducts were obtained, and three-dimensional MRCP images were rendered by post processing. CONTRAST:  76m GADAVIST GADOBUTROL 1 MMOL/ML IV SOLN COMPARISON:  CT abdomen pelvis, 02/24/2020, PET-CT, 12/25/2019 FINDINGS: Examination is generally limited by breath motion artifact. Lower chest: Trace bilateral pleural effusions. Hepatobiliary: No mass or other parenchymal abnormality identified. Numerous tiny gallstones in the gallbladder. There are approximately 13 tiny gallstones measuring 3-4 mm stacked in the common bile duct to the ampulla (series 10, image 22). The common bile duct is mildly enlarged measuring up to 8 mm. Pancreas: Diffuse  inflammatory stranding about the pancreas and adjacent retroperitoneum. No pancreatic ductal dilatation. No evidence of parenchymal hypoenhancement or fluid collection. Spleen:  Within normal limits in size and appearance. Adrenals/Urinary Tract: No masses identified. Exophytic fluid signal cyst of the right kidney. No evidence of hydronephrosis. Stomach/Bowel: Visualized portions within the abdomen are unremarkable. Vascular/Lymphatic: No pathologically enlarged lymph nodes identified. No abdominal aortic aneurysm demonstrated. Other:  None. Musculoskeletal: No suspicious bone lesions identified. IMPRESSION: 1. Numerous tiny gallstones in the gallbladder and approximately 13 gallstones measuring 3-4 mm mm stacked  in the common bile duct to the ampulla. These calculi were not radiopaque by prior CT. 2. Diffuse inflammatory stranding about the pancreas and adjacent retroperitoneum, consistent with gallstone pancreatitis. No pancreatic ductal dilatation. No evidence of pancreatic parenchymal necrosis or acute pancreatic fluid collection. Electronically Signed   By: Eddie Candle M.D.   On: 02/25/2020 14:56   DG ERCP BILIARY & PANCREATIC DUCTS  Result Date: 02/26/2020 CLINICAL DATA:  Choledocholithiasis. EXAM: ERCP TECHNIQUE: Multiple spot images obtained with the fluoroscopic device and submitted for interpretation post-procedure. FLUOROSCOPY TIME:  Fluoroscopy Time:  8 minutes, 6 seconds Number of Acquired Spot Images: 16 COMPARISON:  MRI 02/25/2020 FINDINGS: Common bile duct was cannulated and retrograde cholangiogram was performed. Filling defects in the common bile duct that are compatible with stones. Common bile duct is mildly dilated. Evidence for balloon sweep for stone removal. IMPRESSION: Choledocholithiasis with stone removal. These images were submitted for radiologic interpretation only. Please see the procedural report for the amount of contrast and the fluoroscopy time utilized. Electronically Signed   By: Markus Daft M.D.   On: 02/26/2020 13:37   MR ABDOMEN WITH MRCP W CONTRAST  Result Date: 02/25/2020 CLINICAL DATA:  Pancreatitis suspected, dilated CBD, elevated LFTs, history of scalp melanoma EXAM: MRI ABDOMEN WITH CONTRAST (WITH MRCP) TECHNIQUE: Multiplanar multisequence MR imaging of the abdomen was performed following the administration of intravenous contrast. Heavily T2-weighted images of the biliary and pancreatic ducts were obtained, and three-dimensional MRCP images were rendered by post processing. CONTRAST:  46m GADAVIST GADOBUTROL 1 MMOL/ML IV SOLN COMPARISON:  CT abdomen pelvis, 02/24/2020, PET-CT, 12/25/2019 FINDINGS: Examination is generally limited by breath motion artifact. Lower chest:  Trace bilateral pleural effusions. Hepatobiliary: No mass or other parenchymal abnormality identified. Numerous tiny gallstones in the gallbladder. There are approximately 13 tiny gallstones measuring 3-4 mm stacked in the common bile duct to the ampulla (series 10, image 22). The common bile duct is mildly enlarged measuring up to 8 mm. Pancreas: Diffuse inflammatory stranding about the pancreas and adjacent retroperitoneum. No pancreatic ductal dilatation. No evidence of parenchymal hypoenhancement or fluid collection. Spleen:  Within normal limits in size and appearance. Adrenals/Urinary Tract: No masses identified. Exophytic fluid signal cyst of the right kidney. No evidence of hydronephrosis. Stomach/Bowel: Visualized portions within the abdomen are unremarkable. Vascular/Lymphatic: No pathologically enlarged lymph nodes identified. No abdominal aortic aneurysm demonstrated. Other:  None. Musculoskeletal: No suspicious bone lesions identified. IMPRESSION: 1. Numerous tiny gallstones in the gallbladder and approximately 13 gallstones measuring 3-4 mm mm stacked in the common bile duct to the ampulla. These calculi were not radiopaque by prior CT. 2. Diffuse inflammatory stranding about the pancreas and adjacent retroperitoneum, consistent with gallstone pancreatitis. No pancreatic ductal dilatation. No evidence of pancreatic parenchymal necrosis or acute pancreatic fluid collection. Electronically Signed   By: AEddie CandleM.D.   On: 02/25/2020 14:56    Principal Problem:   Acute pancreatitis Active Problems:   GERD  Elevated LFTs   Bile duct stone     LOS: 3 days   Miguel Thomas ,NP 02/27/2020, 8:54 AM     ]

## 2020-02-27 NOTE — Progress Notes (Signed)
Assessment & Plan: Biliary pancreatitis, cholelithiasis  ERCP by Dr. Scarlette Shorts successful in clearing CBD  Plan lap chole today to prevent recurrence  Discussed lap cholecystectomy with patient today in detail.  Explained why we remove gallbladder and not just stones.  Patient understands and agrees to proceed today with cholecystectomy.  The risks and benefits of the procedure have been discussed at length with the patient.  The patient understands the proposed procedure, potential alternative treatments, and the course of recovery to be expected.  All of the patient's questions have been answered at this time.  The patient wishes to proceed with surgery.        Miguel Gemma, MD       [facility]       Office: [phone]   Chief Complaint: Biliary pancreatitis  Subjective: Patient comfortable, no complaints.  Objective: Vital signs in last 24 hours: Temp:  [97.8 F (36.6 C)-99.1 F (37.3 C)] 98.5 F (36.9 C) (04/27 0537) Pulse Rate:  [70-80] 71 (04/27 0537) Resp:  [9-17] 16 (04/27 0537) BP: (104-156)/(60-92) 115/61 (04/27 0537) SpO2:  [94 %-100 %] 97 % (04/27 0537) Weight:  [81.6 kg] 81.6 kg (04/26 1046) Last BM Date: 02/24/20  Intake/Output from previous day: 04/26 0701 - 04/27 0700 In: 600 [I.V.:600] Out: 1875 [Urine:1875] Intake/Output this shift: No intake/output data recorded.  Physical Exam: HEENT - sclerae clear, mucous membranes moist Neck - soft Chest - clear bilaterally Cor - RRR Abdomen - soft, non-tender; no mass; small ecchymosis lower abd wall (heparin) Ext - no edema, non-tender Neuro - alert & oriented, no focal deficits  Lab Results:  Recent Labs    02/25/20 0556 02/26/20 0528  WBC 7.2 7.9  HGB 11.7* 11.5*  HCT 36.3* 35.4*  PLT 165 162   BMET Recent Labs    02/25/20 0556 02/26/20 0528  NA 141 140  K 3.6 3.4*  CL 110 108  CO2 25 24  GLUCOSE 89 87  BUN 17 11  CREATININE 0.61 0.52*  CALCIUM 8.3* 8.3*    PT/INR No results for input(s): LABPROT, INR in the last 72 hours. Comprehensive Metabolic Panel:    Component Value Date/Time   NA 140 02/26/2020 0528   NA 141 02/25/2020 0556   K 3.4 (L) 02/26/2020 0528   K 3.6 02/25/2020 0556   CL 108 02/26/2020 0528   CL 110 02/25/2020 0556   CO2 24 02/26/2020 0528   CO2 25 02/25/2020 0556   BUN 11 02/26/2020 0528   BUN 17 02/25/2020 0556   CREATININE 0.52 (L) 02/26/2020 0528   CREATININE 0.61 02/25/2020 0556   CREATININE 0.86 08/09/2013 0922   CREATININE 0.83 06/24/2012 1041   GLUCOSE 87 02/26/2020 0528   GLUCOSE 89 02/25/2020 0556   CALCIUM 8.3 (L) 02/26/2020 0528   CALCIUM 8.3 (L) 02/25/2020 0556   AST 66 (H) 02/26/2020 0528   AST 130 (H) 02/25/2020 0556   ALT 138 (H) 02/26/2020 0528   ALT 206 (H) 02/25/2020 0556   ALKPHOS 220 (H) 02/26/2020 0528   ALKPHOS 271 (H) 02/25/2020 0556   BILITOT 2.1 (H) 02/26/2020 0528   BILITOT 3.3 (H) 02/25/2020 0556   PROT 6.1 (L) 02/26/2020 0528   PROT 6.6 02/25/2020 0556   ALBUMIN 3.0 (L) 02/26/2020 0528   ALBUMIN 3.1 (L) 02/25/2020 0556    Studies/Results: MR 3D Recon At Scanner  Result Date: 02/25/2020 CLINICAL DATA:  Pancreatitis suspected, dilated CBD, elevated LFTs, history of scalp melanoma EXAM: MRI  ABDOMEN WITH CONTRAST (WITH MRCP) TECHNIQUE: Multiplanar multisequence MR imaging of the abdomen was performed following the administration of intravenous contrast. Heavily T2-weighted images of the biliary and pancreatic ducts were obtained, and three-dimensional MRCP images were rendered by post processing. CONTRAST:  32mL GADAVIST GADOBUTROL 1 MMOL/ML IV SOLN COMPARISON:  CT abdomen pelvis, 02/24/2020, PET-CT, 12/25/2019 FINDINGS: Examination is generally limited by breath motion artifact. Lower chest: Trace bilateral pleural effusions. Hepatobiliary: No mass or other parenchymal abnormality identified. Numerous tiny gallstones in the gallbladder. There are approximately 13 tiny gallstones  measuring 3-4 mm stacked in the common bile duct to the ampulla (series 10, image 22). The common bile duct is mildly enlarged measuring up to 8 mm. Pancreas: Diffuse inflammatory stranding about the pancreas and adjacent retroperitoneum. No pancreatic ductal dilatation. No evidence of parenchymal hypoenhancement or fluid collection. Spleen:  Within normal limits in size and appearance. Adrenals/Urinary Tract: No masses identified. Exophytic fluid signal cyst of the right kidney. No evidence of hydronephrosis. Stomach/Bowel: Visualized portions within the abdomen are unremarkable. Vascular/Lymphatic: No pathologically enlarged lymph nodes identified. No abdominal aortic aneurysm demonstrated. Other:  None. Musculoskeletal: No suspicious bone lesions identified. IMPRESSION: 1. Numerous tiny gallstones in the gallbladder and approximately 13 gallstones measuring 3-4 mm mm stacked in the common bile duct to the ampulla. These calculi were not radiopaque by prior CT. 2. Diffuse inflammatory stranding about the pancreas and adjacent retroperitoneum, consistent with gallstone pancreatitis. No pancreatic ductal dilatation. No evidence of pancreatic parenchymal necrosis or acute pancreatic fluid collection. Electronically Signed   By: Eddie Candle M.D.   On: 02/25/2020 14:56   DG ERCP BILIARY & PANCREATIC DUCTS  Result Date: 02/26/2020 CLINICAL DATA:  Choledocholithiasis. EXAM: ERCP TECHNIQUE: Multiple spot images obtained with the fluoroscopic device and submitted for interpretation post-procedure. FLUOROSCOPY TIME:  Fluoroscopy Time:  8 minutes, 6 seconds Number of Acquired Spot Images: 16 COMPARISON:  MRI 02/25/2020 FINDINGS: Common bile duct was cannulated and retrograde cholangiogram was performed. Filling defects in the common bile duct that are compatible with stones. Common bile duct is mildly dilated. Evidence for balloon sweep for stone removal. IMPRESSION: Choledocholithiasis with stone removal. These images  were submitted for radiologic interpretation only. Please see the procedural report for the amount of contrast and the fluoroscopy time utilized. Electronically Signed   By: Markus Daft M.D.   On: 02/26/2020 13:37   MR ABDOMEN WITH MRCP W CONTRAST  Result Date: 02/25/2020 CLINICAL DATA:  Pancreatitis suspected, dilated CBD, elevated LFTs, history of scalp melanoma EXAM: MRI ABDOMEN WITH CONTRAST (WITH MRCP) TECHNIQUE: Multiplanar multisequence MR imaging of the abdomen was performed following the administration of intravenous contrast. Heavily T2-weighted images of the biliary and pancreatic ducts were obtained, and three-dimensional MRCP images were rendered by post processing. CONTRAST:  16mL GADAVIST GADOBUTROL 1 MMOL/ML IV SOLN COMPARISON:  CT abdomen pelvis, 02/24/2020, PET-CT, 12/25/2019 FINDINGS: Examination is generally limited by breath motion artifact. Lower chest: Trace bilateral pleural effusions. Hepatobiliary: No mass or other parenchymal abnormality identified. Numerous tiny gallstones in the gallbladder. There are approximately 13 tiny gallstones measuring 3-4 mm stacked in the common bile duct to the ampulla (series 10, image 22). The common bile duct is mildly enlarged measuring up to 8 mm. Pancreas: Diffuse inflammatory stranding about the pancreas and adjacent retroperitoneum. No pancreatic ductal dilatation. No evidence of parenchymal hypoenhancement or fluid collection. Spleen:  Within normal limits in size and appearance. Adrenals/Urinary Tract: No masses identified. Exophytic fluid signal cyst of the right kidney. No evidence  of hydronephrosis. Stomach/Bowel: Visualized portions within the abdomen are unremarkable. Vascular/Lymphatic: No pathologically enlarged lymph nodes identified. No abdominal aortic aneurysm demonstrated. Other:  None. Musculoskeletal: No suspicious bone lesions identified. IMPRESSION: 1. Numerous tiny gallstones in the gallbladder and approximately 13 gallstones  measuring 3-4 mm mm stacked in the common bile duct to the ampulla. These calculi were not radiopaque by prior CT. 2. Diffuse inflammatory stranding about the pancreas and adjacent retroperitoneum, consistent with gallstone pancreatitis. No pancreatic ductal dilatation. No evidence of pancreatic parenchymal necrosis or acute pancreatic fluid collection. Electronically Signed   By: Eddie Candle M.D.   On: 02/25/2020 14:56      Miguel Thomas 02/27/2020  Patient ID: Miguel Thomas, male   DOB: 02-27-1941, 79 y.o.   MRN: [id number]

## 2020-02-28 LAB — CBC
HCT: 35.5 % — ABNORMAL LOW (ref 39.0–52.0)
Hemoglobin: 11.4 g/dL — ABNORMAL LOW (ref 13.0–17.0)
MCH: 32.1 pg (ref 26.0–34.0)
MCHC: 32.1 g/dL (ref 30.0–36.0)
MCV: 100 fL (ref 80.0–100.0)
Platelets: 158 10*3/uL (ref 150–400)
RBC: 3.55 MIL/uL — ABNORMAL LOW (ref 4.22–5.81)
RDW: 13.5 % (ref 11.5–15.5)
WBC: 8.3 10*3/uL (ref 4.0–10.5)
nRBC: 0 % (ref 0.0–0.2)

## 2020-02-28 LAB — BASIC METABOLIC PANEL
Anion gap: 6 (ref 5–15)
BUN: 14 mg/dL (ref 8–23)
CO2: 25 mmol/L (ref 22–32)
Calcium: 8.1 mg/dL — ABNORMAL LOW (ref 8.9–10.3)
Chloride: 108 mmol/L (ref 98–111)
Creatinine, Ser: 0.7 mg/dL (ref 0.61–1.24)
GFR calc Af Amer: >60 mL/min (ref >60)
GFR calc non Af Amer: >60 mL/min (ref >60)
Glucose, Bld: 130 mg/dL — ABNORMAL HIGH (ref 70–99)
Potassium: 3.8 mmol/L (ref 3.5–5.1)
Sodium: 139 mmol/L (ref 135–145)

## 2020-02-28 MED ORDER — PANTOPRAZOLE SODIUM 40 MG PO TBEC
40.0000 mg | DELAYED_RELEASE_TABLET | Freq: Every day | ORAL | Status: DC
  Administered 2020-02-28 – 2020-02-29 (×2): 40 mg via ORAL
  Filled 2020-02-28 (×2): qty 1

## 2020-02-28 MED ORDER — PHENOL 1.4 % MT LIQD
1.0000 | OROMUCOSAL | Status: DC | PRN
  Filled 2020-02-28: qty 177

## 2020-02-28 NOTE — Care Management Important Message (Signed)
Important Message  Patient Details IM Letter given to Marney Doctor RN Case Manager to present to the Patient Name: Miguel Thomas MRN: [id number] Date of Birth: 08-26-1941   Medicare Important Message Given:  Yes     Kerin Salen 02/28/2020, 10:00 AM

## 2020-02-28 NOTE — Evaluation (Signed)
Physical Therapy Evaluation Patient Details Name: Miguel Thomas MRN: [id number] DOB: 1941/08/21 Today's Date: 02/28/2020   History of Present Illness  79 year old male with history of GERD, chronic reflux, dysphagia, melanoma scalp, COVID-19 in January 2021 presented with nausea, vomiting, abdominal pain and admitted for acute gallstone pancreatitis with obstruction of CBD.  Patient underwent ERCP with stone extraction on February 26, 2020.  Pt s/p lap cholecystectomy 02/27/20  Clinical Impression  Pt admitted with above diagnosis.  Pt currently with functional limitations due to the deficits listed below (see PT Problem List). Pt will benefit from skilled PT to increase their independence and safety with mobility to allow discharge to the venue listed below.  Pt reports being mostly in bed for the past week.  Pt requiring slight assist for standing however mobility improved with ambulation with distance.  Pt utilized RW for a little more support today as well (has RW at home however independent at baseline).  Pt encouraged to ambulate with staff again today and during acute stay.     Follow Up Recommendations Home health PT;Supervision - Intermittent    Equipment Recommendations  None recommended by PT    Recommendations for Other Services       Precautions / Restrictions Precautions Precautions: Fall Precaution Comments: R JP drain      Mobility  Bed Mobility Overal bed mobility: Modified Independent                Transfers Overall transfer level: Needs assistance Equipment used: Rolling walker (2 wheeled) Transfers: Sit to/from Stand Sit to Stand: Min assist         General transfer comment: assist to rise and steady; pt with controlled descent and used armrests  Ambulation/Gait Ambulation/Gait assistance: Min guard Gait Distance (Feet): 560 Feet Assistive device: Rolling walker (2 wheeled) Gait Pattern/deviations: Step-through pattern;Decreased stride length      General Gait Details: verbal cues for safe use of RW  Stairs            Wheelchair Mobility    Modified Rankin (Stroke Patients Only)       Balance Overall balance assessment: Mild deficits observed, not formally tested                                           Pertinent Vitals/Pain Pain Assessment: No/denies pain    Home Living Family/patient expects to be discharged to:: Private residence Living Arrangements: Children(son)   Type of Home: House Home Access: Level entry     Home Layout: One level Home Equipment: Environmental consultant - 2 wheels      Prior Function Level of Independence: Independent         Comments: son lives with pt, pt lost his spouse in January     Hand Dominance        Extremity/Trunk Assessment        Lower Extremity Assessment Lower Extremity Assessment: Generalized weakness    Cervical / Trunk Assessment Cervical / Trunk Assessment: Kyphotic(forward head posture)  Communication   Communication: No difficulties  Cognition Arousal/Alertness: Awake/alert Behavior During Therapy: WFL for tasks assessed/performed Overall Cognitive Status: Within Functional Limits for tasks assessed  General Comments      Exercises     Assessment/Plan    PT Assessment Patient needs continued PT services  PT Problem List Decreased strength;Decreased mobility;Decreased balance;Decreased knowledge of use of DME;Decreased activity tolerance       PT Treatment Interventions Gait training;Therapeutic exercise;Patient/family education;Therapeutic activities;DME instruction;Functional mobility training;Stair training;Balance training    PT Goals (Current goals can be found in the Care Plan section)  Acute Rehab PT Goals PT Goal Formulation: With patient Time For Goal Achievement: 03/06/20 Potential to Achieve Goals: Good    Frequency Min 3X/week   Barriers to discharge         Co-evaluation               AM-PAC PT "6 Clicks" Mobility  Outcome Measure Help needed turning from your back to your side while in a flat bed without using bedrails?: A Little Help needed moving from lying on your back to sitting on the side of a flat bed without using bedrails?: A Little Help needed moving to and from a bed to a chair (including a wheelchair)?: A Little Help needed standing up from a chair using your arms (e.g., wheelchair or bedside chair)?: A Little Help needed to walk in hospital room?: A Little Help needed climbing 3-5 steps with a railing? : A Little 6 Click Score: 18    End of Session Equipment Utilized During Treatment: Gait belt Activity Tolerance: Patient tolerated treatment well Patient left: in chair;with call bell/phone within reach;with nursing/sitter in room;with chair alarm set Nurse Communication: Mobility status PT Visit Diagnosis: Difficulty in walking, not elsewhere classified (R26.2)    Time: [id number] PT Time Calculation (min) (ACUTE ONLY): 24 min   Charges:   PT Evaluation $PT Eval Low Complexity: 1 Low     Miguel Thomas PT, DPT Acute Rehabilitation Services Office: [phone]  Miguel Thomas 02/28/2020, 3:36 PM

## 2020-02-28 NOTE — Progress Notes (Signed)
Patient eating breakfast. Will follow up with drain care once the patient is done eating.

## 2020-02-28 NOTE — Progress Notes (Signed)
[facility] Progress Note  1 Day Post-Op  Subjective: CC:  C/o sore throat. Mild abdominal pain with movement/coughing. +flatus. Denies BM. No reported fever, chills, chest pain, SOB, or urinary sxs.   States his wife passed away in 12-04-2022 and since this time he has been living with his son. Son works full time at [employer] in [place], so son will not be at home with him during the day. Also has some friends from church who would be willing to help him as needed.   Objective: Vital signs in last 24 hours: Temp:  [97.8 F (36.6 C)-98.4 F (36.9 C)] 97.8 F (36.6 C) (04/28 0536) Pulse Rate:  [72-79] 75 (04/28 0536) Resp:  [12-20] 14 (04/28 0536) BP: (123-139)/(70-85) 134/81 (04/28 0536) SpO2:  [97 %-100 %] 97 % (04/28 0536) Last BM Date: 02/24/20  Intake/Output from previous day: 04/27 0701 - 04/28 0700 In: 1010 [I.V.:1010] Out: 1120 [Urine:900; Drains:20; Blood:200] Intake/Output this shift: No intake/output data recorded.  PE: Gen:  Alert, NAD, pleasant Card:  Regular rate and rhythm Pulm:  Normal effort Abd: Soft, appropriately tender, non-distended, bowel sounds present in all 4 quadrants, incisions C/D/I, RLQ drain with dark, sanguinous drainage.   JP 20 cc overnight Skin: warm and dry, no rashes  Psych: A&Ox3   Lab Results:  Recent Labs    02/26/20 0528  WBC 7.9  HGB 11.5*  HCT 35.4*  PLT 162   BMET Recent Labs    02/26/20 0528  NA 140  K 3.4*  CL 108  CO2 24  GLUCOSE 87  BUN 11  CREATININE 0.52*  CALCIUM 8.3*   PT/INR No results for input(s): LABPROT, INR in the last 72 hours. CMP     Component Value Date/Time   NA 140 02/26/2020 0528   K 3.4 (L) 02/26/2020 0528   CL 108 02/26/2020 0528   CO2 24 02/26/2020 0528   GLUCOSE 87 02/26/2020 0528   BUN 11 02/26/2020 0528   CREATININE 0.52 (L) 02/26/2020 0528   CREATININE 0.86 08/09/2013 0922   CALCIUM 8.3 (L) 02/26/2020 0528   PROT 6.1 (L) 02/26/2020 0528   ALBUMIN 3.0 (L)  02/26/2020 0528   AST 66 (H) 02/26/2020 0528   ALT 138 (H) 02/26/2020 0528   ALKPHOS 220 (H) 02/26/2020 0528   BILITOT 2.1 (H) 02/26/2020 0528   GFRNONAA >60 02/26/2020 0528   GFRNONAA 87 06/24/2012 1041   GFRAA >60 02/26/2020 0528   GFRAA >89 06/24/2012 1041   Lipase     Component Value Date/Time   LIPASE 48 02/26/2020 0528       Studies/Results: DG ERCP BILIARY & PANCREATIC DUCTS  Result Date: 02/26/2020 CLINICAL DATA:  Choledocholithiasis. EXAM: ERCP TECHNIQUE: Multiple spot images obtained with the fluoroscopic device and submitted for interpretation post-procedure. FLUOROSCOPY TIME:  Fluoroscopy Time:  8 minutes, 6 seconds Number of Acquired Spot Images: 16 COMPARISON:  MRI 02/25/2020 FINDINGS: Common bile duct was cannulated and retrograde cholangiogram was performed. Filling defects in the common bile duct that are compatible with stones. Common bile duct is mildly dilated. Evidence for balloon sweep for stone removal. IMPRESSION: Choledocholithiasis with stone removal. These images were submitted for radiologic interpretation only. Please see the procedural report for the amount of contrast and the fluoroscopy time utilized. Electronically Signed   By: Markus Daft M.D.   On: 02/26/2020 13:37    Anti-infectives: Anti-infectives (From admission, onward)   Start     Dose/Rate Route Frequency Ordered Stop   02/25/20 1800  ciprofloxacin (CIPRO) IVPB 400 mg  Status:  Discontinued     400 mg 200 mL/hr over 60 Minutes Intravenous Every 12 hours 02/25/20 1746 02/27/20 1824     Assessment/Plan Melanoma GERD H/O Esophageal strictures  COVID January 2021  Gallstone pancreatitis - s/p ERCP sphincterotomy Dr. Henrene Pastor  - POD#1 s/p laparoscopic cholecystectomy  - afebrile, VSS, pain controlled - advance diet to low fat - PT eval  - CBC, BMP are pending and I will follow  - follow drain output   FEN: low fat/heart healthy ID: perioperative ancef VTE: SCD's, ok to resume  chemical VTE prophylaxis   Dispo: pending, from a surgical perspective the patient will be stable for discharge in 24-48h pending toleration of diet and mobilization with therapies    LOS: 4 days    Obie Dredge, [facility]

## 2020-02-28 NOTE — Progress Notes (Signed)
PROGRESS NOTE    [name]  [id number] DOB: [birth year] DOA: 02/24/2020 PCP: [name], NP    Brief Narrative:  Miguel Thomas is a 79 year old male with history of GERD, chronic reflux, dysphagia, melanoma scalp, COVID-19 in January 2021 presented with nausea, vomiting, abdominal pain. Patient was found to have elevated LFTs, lipase of 4135.  CT abdomen showed acute pancreatitis without necrosis, CBD dilatation, GI was consulted. Patient was admitted for further work-up.   Assessment & Plan:   Principal Problem:   Acute pancreatitis Active Problems:   GERD   Elevated LFTs   Bile duct stone  Acute gallstone pancreatitis with obstruction of CBD Elevated LFTs Patient presenting with abdominal pain and was found to have elevated LFTs with a lipase of 4135.  CT abdomen/pelvis notable for acute pancreatitis without necrosis and CBD dilation.  MRCP notable for numerous tiny gallstones in the gallbladder approximately 13 gallstones measuring 3-4 mm stacked in the common bile duct to the ampulla with diffuse inflammatory stranding about the pancreas and adjacent retroperitoneum consistent with gallstone pancreatitis.  Underwent ERCP with biliary sphincterotomy and common bile duct stone extraction on 02/26/2020.  Underwent laparoscopic cholecystectomy by Dr. Harlow Asa on 02/27/2020. --General Surgery following, appreciate assistance --AST 249>130>66 --ALT 289>206>138 --Tbili 3.4>3.3>2.1 --Lipase [id number] --Advancing diet today --Continue to monitor drain output, 24 hours --Possible DC home tomorrow  GERD --Continue PPI --Follows outpatient GI, Dr. Carlean Purl   DVT prophylaxis: SCDs Code Status: Full code Family Communication: Discussed with patient extensively at bedside this morning  Disposition Plan:  Status is: Inpatient  Remains inpatient appropriate because:Ongoing diagnostic testing needed not appropriate for outpatient work up, Unsafe d/c plan, IV treatments  appropriate due to intensity of illness or inability to take PO and Inpatient level of care appropriate due to severity of illness   Dispo: The patient is from: Home              Anticipated d/c is to: Home              Anticipated d/c date is: 1 day              Patient currently is not medically stable to d/c.   Consultants:   [facility] - signed off 4/27  General Surgery  Procedures:   ERCP 4/26  Antimicrobials:   Ciprofloxacin 4/25 -4/27   Subjective: Examined bedside, resting comfortably.  No specific complaints this morning.  Underwent laparoscopic cholecystectomy yesterday with drain in place.   In the waiting for surgery planned this afternoon.  No specific complaints.  Denies headache, no fever/chills/night sweats, no nausea/vomiting/diarrhea, no chest pain, no palpitations, no abdominal pain.  No acute events overnight per nursing staff.  Objective: Vitals:   02/27/20 1800 02/27/20 1836 02/27/20 2100 02/28/20 0536  BP: 135/72 137/72 123/83 134/81  Pulse: 72 73 78 75  Resp: 20 16 16 14   Temp: 97.8 F (36.6 C) 98 F (36.7 C) 98.4 F (36.9 C) 97.8 F (36.6 C)  TempSrc:  Oral Oral Oral  SpO2: 100% 99% 98% 97%  Weight:      Height:        Intake/Output Summary (Last 24 hours) at 02/28/2020 1350 Last data filed at 02/28/2020 1045 Gross per 24 hour  Intake 1010 ml  Output 2050 ml  Net -1040 ml   Filed Weights   02/24/20 0703 02/26/20 1046  Weight: 81.6 kg 81.6 kg    Examination:  General exam: Appears calm and comfortable  Respiratory system:  Clear to auscultation. Respiratory effort normal. Cardiovascular system: S1 & S2 heard, RRR. No JVD, murmurs, rubs, gallops or clicks. No pedal edema. Gastrointestinal system: Abdomen is nondistended, soft and nontender. No organomegaly or masses felt. Normal bowel sounds heard.  Surgical sites and drain noted Central nervous system: Alert and oriented. No focal neurological deficits. Extremities: Symmetric 5 x 5  power. Skin: No rashes, lesions or ulcers, noted dressing over scalp Psychiatry: Judgement and insight appear normal. Mood & affect appropriate.     Data Reviewed: I have personally reviewed following labs and imaging studies  CBC: Recent Labs  Lab 02/24/20 0933 02/25/20 0556 02/26/20 0528 02/28/20 0752  WBC 7.4 7.2 7.9 8.3  HGB 14.1 11.7* 11.5* 11.4*  HCT 43.6 36.3* 35.4* 35.5*  MCV 101.2* 100.8* 99.4 100.0  PLT 220 165 162 0000000   Basic Metabolic Panel: Recent Labs  Lab 02/24/20 0933 02/24/20 1510 02/25/20 0556 02/26/20 0528 02/28/20 0752  NA 139  --  141 140 139  K 4.2  --  3.6 3.4* 3.8  CL 107  --  110 108 108  CO2 21*  --  25 24 25   GLUCOSE 155*  --  89 87 130*  BUN 17  --  17 11 14   CREATININE 0.87 0.81 0.61 0.52* 0.70  CALCIUM 8.8*  --  8.3* 8.3* 8.1*  MG  --   --  2.0  --   --    GFR: Estimated Creatinine Clearance: 77.1 mL/min (by C-G formula based on SCr of 0.7 mg/dL). Liver Function Tests: Recent Labs  Lab 02/24/20 0933 02/25/20 0556 02/26/20 0528  AST 249* 130* 66*  ALT 289* 206* 138*  ALKPHOS 300* 271* 220*  BILITOT 3.4* 3.3* 2.1*  PROT 7.7 6.6 6.1*  ALBUMIN 3.9 3.1* 3.0*   Recent Labs  Lab 02/24/20 0933 02/25/20 1742 02/26/20 0528  LIPASE 4,135* 322* 48   No results for input(s): AMMONIA in the last 168 hours. Coagulation Profile: No results for input(s): INR, PROTIME in the last 168 hours. Cardiac Enzymes: No results for input(s): CKTOTAL, CKMB, CKMBINDEX, TROPONINI in the last 168 hours. BNP (last 3 results) No results for input(s): PROBNP in the last 8760 hours. HbA1C: No results for input(s): HGBA1C in the last 72 hours. CBG: Recent Labs  Lab 02/24/20 2106 02/25/20 0005 02/25/20 0416 02/25/20 0756 02/25/20 2107  GLUCAP 99 78 81 77 64*   Lipid Profile: No results for input(s): CHOL, HDL, LDLCALC, TRIG, CHOLHDL, LDLDIRECT in the last 72 hours. Thyroid Function Tests: No results for input(s): TSH, T4TOTAL, FREET4, T3FREE,  THYROIDAB in the last 72 hours. Anemia Panel: No results for input(s): VITAMINB12, FOLATE, FERRITIN, TIBC, IRON, RETICCTPCT in the last 72 hours. Sepsis Labs: No results for input(s): PROCALCITON, LATICACIDVEN in the last 168 hours.  Recent Results (from the past 240 hour(s))  Respiratory Panel by RT PCR (Flu A&B, Covid) - Nasopharyngeal Swab     Status: None   Collection Time: 02/24/20  1:27 PM   Specimen: Nasopharyngeal Swab  Result Value Ref Range Status   SARS Coronavirus 2 by RT PCR NEGATIVE NEGATIVE Final    Comment: (NOTE) SARS-CoV-2 target nucleic acids are NOT DETECTED. The SARS-CoV-2 RNA is generally detectable in upper respiratoy specimens during the acute phase of infection. The lowest concentration of SARS-CoV-2 viral copies this assay can detect is 131 copies/mL. A negative result does not preclude SARS-Cov-2 infection and should not be used as the sole basis for treatment or other patient management decisions. A  negative result may occur with  improper specimen collection/handling, submission of specimen other than nasopharyngeal swab, presence of viral mutation(s) within the areas targeted by this assay, and inadequate number of viral copies (<131 copies/mL). A negative result must be combined with clinical observations, patient history, and epidemiological information. The expected result is Negative. Fact Sheet for Patients:  PinkCheek.be Fact Sheet for Healthcare Providers:  GravelBags.it This test is not yet ap proved or cleared by the [place] FDA and  has been authorized for detection and/or diagnosis of SARS-CoV-2 by FDA under an Emergency Use Authorization (EUA). This EUA will remain  in effect (meaning this test can be used) for the duration of the COVID-19 declaration under Section 564(b)(1) of the Act, 21 U.S.C. section 360bbb-3(b)(1), unless the authorization is terminated or revoked  sooner.    Influenza A by PCR NEGATIVE NEGATIVE Final   Influenza B by PCR NEGATIVE NEGATIVE Final    Comment: (NOTE) The Xpert Xpress SARS-CoV-2/FLU/RSV assay is intended as an aid in  the diagnosis of influenza from Nasopharyngeal swab specimens and  should not be used as a sole basis for treatment. Nasal washings and  aspirates are unacceptable for Xpert Xpress SARS-CoV-2/FLU/RSV  testing. Fact Sheet for Patients: PinkCheek.be Fact Sheet for Healthcare Providers: GravelBags.it This test is not yet approved or cleared by the [place] FDA and  has been authorized for detection and/or diagnosis of SARS-CoV-2 by  FDA under an Emergency Use Authorization (EUA). This EUA will remain  in effect (meaning this test can be used) for the duration of the  Covid-19 declaration under Section 564(b)(1) of the Act, 21  U.S.C. section 360bbb-3(b)(1), unless the authorization is  terminated or revoked. Performed at [facility], [place] [address] [place], [place] [address]          Radiology Studies: No results found.      Scheduled Meds: . pantoprazole  40 mg Oral Daily   Continuous Infusions:    LOS: 4 days    Time spent: 32 minutes spent on chart review, discussion with nursing staff, consultants, updating family and interview/physical exam; more than 50% of that time was spent in counseling and/or coordination of care.    Toryn Mcclinton J [place] DO [facility] Available via Epic secure chat 7am-7pm After these hours, please refer to coverage provider listed on [email or web address] 02/28/2020, 1:50 PM

## 2020-02-29 DIAGNOSIS — K8511 Biliary acute pancreatitis with uninfected necrosis: Secondary | ICD-10-CM

## 2020-02-29 LAB — SURGICAL PATHOLOGY

## 2020-02-29 LAB — COMPREHENSIVE METABOLIC PANEL
ALT: 197 U/L — ABNORMAL HIGH (ref 0–44)
AST: 117 U/L — ABNORMAL HIGH (ref 15–41)
Albumin: 2.6 g/dL — ABNORMAL LOW (ref 3.5–5.0)
Alkaline Phosphatase: 174 U/L — ABNORMAL HIGH (ref 38–126)
Anion gap: 7 (ref 5–15)
BUN: 18 mg/dL (ref 8–23)
CO2: 25 mmol/L (ref 22–32)
Calcium: 7.7 mg/dL — ABNORMAL LOW (ref 8.9–10.3)
Chloride: 105 mmol/L (ref 98–111)
Creatinine, Ser: 0.76 mg/dL (ref 0.61–1.24)
GFR calc Af Amer: >60 mL/min (ref >60)
GFR calc non Af Amer: >60 mL/min (ref >60)
Glucose, Bld: 77 mg/dL (ref 70–99)
Potassium: 3.3 mmol/L — ABNORMAL LOW (ref 3.5–5.1)
Sodium: 137 mmol/L (ref 135–145)
Total Bilirubin: 1.5 mg/dL — ABNORMAL HIGH (ref 0.3–1.2)
Total Protein: 5.5 g/dL — ABNORMAL LOW (ref 6.5–8.1)

## 2020-02-29 LAB — CBC
HCT: 33.6 % — ABNORMAL LOW (ref 39.0–52.0)
Hemoglobin: 10.9 g/dL — ABNORMAL LOW (ref 13.0–17.0)
MCH: 32.6 pg (ref 26.0–34.0)
MCHC: 32.4 g/dL (ref 30.0–36.0)
MCV: 100.6 fL — ABNORMAL HIGH (ref 80.0–100.0)
Platelets: 138 10*3/uL — ABNORMAL LOW (ref 150–400)
RBC: 3.34 MIL/uL — ABNORMAL LOW (ref 4.22–5.81)
RDW: 14.1 % (ref 11.5–15.5)
WBC: 6.8 10*3/uL (ref 4.0–10.5)
nRBC: 0 % (ref 0.0–0.2)

## 2020-02-29 MED ORDER — POTASSIUM CHLORIDE CRYS ER 20 MEQ PO TBCR
40.0000 meq | EXTENDED_RELEASE_TABLET | Freq: Once | ORAL | Status: AC
  Administered 2020-02-29: 40 meq via ORAL
  Filled 2020-02-29: qty 2

## 2020-02-29 NOTE — Progress Notes (Signed)
[facility] Progress Note  2 Days Post-Op  Subjective: CC:  Reports mild abdominal soreness with movement/coughing. Mobilized in the hallway yesterday. Tolerating heart healthy diet without nausea or vomiting. No reported urinary sxs. +flatus.   Expresses concern about caring for his drain at home.  Objective: Vital signs in last 24 hours: Temp:  [98.2 F (36.8 C)-99 F (37.2 C)] 99 F (37.2 C) (04/29 0601) Pulse Rate:  [68-71] 68 (04/29 0601) Resp:  [14-18] 14 (04/29 0601) BP: (116-118)/(62-74) 118/65 (04/29 0601) SpO2:  [95 %-96 %] 96 % (04/29 0601) Last BM Date: 02/24/20(per patient; it was before he came in)  Intake/Output from previous day: 04/28 0701 - 04/29 0700 In: 1750 [P.O.:840; I.V.:910] Out: 1810 [id number]; Drains:140] Intake/Output this shift: No intake/output data recorded.  PE: Gen:  Alert, NAD, pleasant Card:  Regular rate and rhythm Pulm:  Normal effort Abd: Soft, minimally tender around incisions, +BS, incisions c/d/i, JP drain with dark SS drainage (140cc/24h) Skin: warm and dry, no rashes  Psych: A&Ox3   Lab Results:  Recent Labs    02/28/20 0752 02/29/20 0529  WBC 8.3 6.8  HGB 11.4* 10.9*  HCT 35.5* 33.6*  PLT 158 138*   BMET Recent Labs    02/28/20 0752 02/29/20 0529  NA 139 137  K 3.8 3.3*  CL 108 105  CO2 25 25  GLUCOSE 130* 77  BUN 14 18  CREATININE 0.70 0.76  CALCIUM 8.1* 7.7*   PT/INR No results for input(s): LABPROT, INR in the last 72 hours. CMP     Component Value Date/Time   NA 137 02/29/2020 0529   K 3.3 (L) 02/29/2020 0529   CL 105 02/29/2020 0529   CO2 25 02/29/2020 0529   GLUCOSE 77 02/29/2020 0529   BUN 18 02/29/2020 0529   CREATININE 0.76 02/29/2020 0529   CREATININE 0.86 08/09/2013 0922   CALCIUM 7.7 (L) 02/29/2020 0529   PROT 5.5 (L) 02/29/2020 0529   ALBUMIN 2.6 (L) 02/29/2020 0529   AST 117 (H) 02/29/2020 0529   ALT 197 (H) 02/29/2020 0529   ALKPHOS 174 (H) 02/29/2020 0529   BILITOT  1.5 (H) 02/29/2020 0529   GFRNONAA >60 02/29/2020 0529   GFRNONAA 87 06/24/2012 1041   GFRAA >60 02/29/2020 0529   GFRAA >89 06/24/2012 1041   Lipase     Component Value Date/Time   LIPASE 48 02/26/2020 0528   Studies/Results: No results found.  Anti-infectives: Anti-infectives (From admission, onward)   Start     Dose/Rate Route Frequency Ordered Stop   02/25/20 1800  ciprofloxacin (CIPRO) IVPB 400 mg  Status:  Discontinued     400 mg 200 mL/hr over 60 Minutes Intravenous Every 12 hours 02/25/20 1746 02/27/20 1824     Assessment/Plan Melanoma GERD H/O Esophageal strictures  COVID January 2021  Gallstone pancreatitis - s/p ERCP sphincterotomy Dr. Henrene Pastor  - POD#2  s/p laparoscopic cholecystectomy  - afebrile, VSS, pain controlled, tolerating diet  - PT eval: HH PT intermittent supervision  FEN: low fat/heart healthy ID: perioperative ancef VTE: SCD's, ok to resume chemical VTE prophylaxis   Dispo: stable for discharge home from surgical perspective, will discuss drain removal before discharge with MD.    LOS: 5 days    Obie Dredge, [facility]

## 2020-02-29 NOTE — Progress Notes (Signed)
Physical Therapy Treatment Patient Details Name: Miguel Thomas MRN: [id number] DOB: 02-07-1941 Today's Date: 02/29/2020    History of Present Illness 79 year old male with history of GERD, chronic reflux, dysphagia, melanoma scalp, COVID-19 in January 2021 presented with nausea, vomiting, abdominal pain and admitted for acute gallstone pancreatitis with obstruction of CBD.  Patient underwent ERCP with stone extraction on February 26, 2020.  Pt s/p lap cholecystectomy 02/27/20    PT Comments    Pt ambulated in hallway and only requiring min/guard for safety today.  Pt anticipates d/c home soon.  Follow Up Recommendations  Home health PT;Supervision - Intermittent     Equipment Recommendations  None recommended by PT    Recommendations for Other Services       Precautions / Restrictions Precautions Precautions: Fall Precaution Comments: R JP drain Restrictions Weight Bearing Restrictions: No    Mobility  Bed Mobility Overal bed mobility: Modified Independent                Transfers Overall transfer level: Needs assistance Equipment used: Rolling walker (2 wheeled) Transfers: Sit to/from Stand Sit to Stand: Min guard         General transfer comment: verbal cues for hand placement to rise  Ambulation/Gait Ambulation/Gait assistance: Min guard Gait Distance (Feet): 560 Feet Assistive device: Rolling walker (2 wheeled) Gait Pattern/deviations: Step-through pattern;Decreased stride length     General Gait Details: steady with RW, tolerated good distance   Stairs             Wheelchair Mobility    Modified Rankin (Stroke Patients Only)       Balance                                            Cognition Arousal/Alertness: Awake/alert Behavior During Therapy: WFL for tasks assessed/performed Overall Cognitive Status: Within Functional Limits for tasks assessed                                        Exercises       General Comments        Pertinent Vitals/Pain Pain Assessment: 0-10 Pain Score: 2  Pain Location: drain site Pain Descriptors / Indicators: Sore Pain Intervention(s): Monitored during session    Home Living                      Prior Function            PT Goals (current goals can now be found in the care plan section) Progress towards PT goals: Progressing toward goals    Frequency    Min 3X/week      PT Plan Current plan remains appropriate    Co-evaluation              AM-PAC PT "6 Clicks" Mobility   Outcome Measure  Help needed turning from your back to your side while in a flat bed without using bedrails?: A Little Help needed moving from lying on your back to sitting on the side of a flat bed without using bedrails?: A Little Help needed moving to and from a bed to a chair (including a wheelchair)?: A Little Help needed standing up from a chair using your arms (e.g., wheelchair or bedside chair)?: A Little  Help needed to walk in hospital room?: A Little Help needed climbing 3-5 steps with a railing? : A Little 6 Click Score: 18    End of Session   Activity Tolerance: Patient tolerated treatment well Patient left: Other (comment);with call bell/phone within reach(on toilet, will use pull cord when ready) Nurse Communication: Mobility status PT Visit Diagnosis: Difficulty in walking, not elsewhere classified (R26.2)     Time: 1000-1012 PT Time Calculation (min) (ACUTE ONLY): 12 min  Charges:  $Gait Training: 8-22 mins                    Arlyce Dice, DPT Acute Rehabilitation Services Office: [phone]  York Ram E 02/29/2020, 1:05 PM

## 2020-02-29 NOTE — Progress Notes (Signed)
Spoke with the patient's son about the patient being discharged. The patient's son said he will get off work around 3:30 and will be coming sometime after.

## 2020-02-29 NOTE — Progress Notes (Signed)
Pt given discharge instructions with teach back. Pt discharged with son via wheelchair.

## 2020-02-29 NOTE — Discharge Summary (Signed)
Physician Discharge Summary  Miguel Thomas [id number] DOB: 04-Jun-1941 DOA: 02/24/2020  PCP: [name], NP  Admit date: 02/24/2020 Discharge date: 02/29/2020  Admitted From: Home Disposition: Home   Recommendations for Outpatient Follow-up:  1. Follow up with PCP in 1-2 weeks 2. Follow-up with general surgery as scheduled 3. Please obtain CMP in one week to ensure resolution of transaminitis, elevated bilirubin  Home Health: Physical therapy Equipment/Devices: None  Discharge Condition: Stable CODE STATUS: Full code Diet recommendation: Cardiac diet  History of present illness:  Miguel Thomas is a 79 year old male with history of GERD, chronic reflux, dysphagia, melanoma scalp, COVID-19 in January 2021 presented with nausea, vomiting, abdominal pain. Patient was found to have elevated LFTs, lipase of 4135. CT abdomen showed acute pancreatitis without necrosis, CBD dilatation, GI was consulted. Patient was admitted for further work-up.  Hospital course:  Acute gallstonepancreatitiswith obstruction of CBD Elevated LFTs Patient presenting with abdominal pain and was found to have elevated LFTs with a lipase of 4135.  CT abdomen/pelvis notable for acute pancreatitis without necrosis and CBD dilation.  MRCP notable for numerous tiny gallstones in the gallbladder approximately 13 gallstones measuring 3-4 mm stacked in the common bile duct to the ampulla with diffuse inflammatory stranding about the pancreas and adjacent retroperitoneum consistent with gallstone pancreatitis. Underwent ERCP with biliary sphincterotomy and common bile duct stone extraction on 02/26/2020.  Underwent laparoscopic cholecystectomy by Dr. Harlow Asa on 02/27/2020.  General surgery plans to remove drain prior to discharge.  Recommend repeat CMP in 1-2 weeks to ensure resolution of transaminitis and elevated bilirubin.  Follow-up with general surgery as scheduled.  GERD: Continue PPI  Discharge Diagnoses:   Active Problems:   GERD   Elevated LFTs    Discharge Instructions  Discharge Instructions    Call MD for:  difficulty breathing, headache or visual disturbances   Complete by: As directed    Call MD for:  extreme fatigue   Complete by: As directed    Call MD for:  persistant dizziness or light-headedness   Complete by: As directed    Call MD for:  persistant nausea and vomiting   Complete by: As directed    Call MD for:  severe uncontrolled pain   Complete by: As directed    Call MD for:  temperature >100.4   Complete by: As directed    Diet - low sodium heart healthy   Complete by: As directed    Increase activity slowly   Complete by: As directed      Allergies as of 02/29/2020   No Known Allergies     Medication List    TAKE these medications   omeprazole 40 MG capsule Commonly known as: PRILOSEC TAKE 1 CAPSULE BY MOUTH ONCE DAILY BEFORE BREAKFAST   Turmeric 500 MG Caps Take 1 capsule by mouth daily.   vitamin B-12 500 MCG tablet Commonly known as: CYANOCOBALAMIN Take 500 mcg by mouth daily.   vitamin C 1000 MG tablet Take 1,000 mg by mouth daily.   zinc gluconate 50 MG tablet Take 50 mg by mouth daily.      Follow-up [facility] Follow up.   Specialty: General Surgery Why: our office is scheduling you for post-operative follow up. please call to confirm appointment date/time. Contact information: [address] [place] [phone]       [name], NP. Schedule an appointment as soon as possible for a visit in  1 week(s).   Specialty: Internal Medicine Contact information: [address] [place] [address] [phone]          No Known Allergies  Consultations:  [facility]  General Surgery   Procedures/Studies: CT ABDOMEN PELVIS W CONTRAST  Result Date: 02/24/2020 CLINICAL DATA:  Abdominal pain and elevated liver function  studies. Nausea and vomiting since last evening. EXAM: CT ABDOMEN AND PELVIS WITH CONTRAST TECHNIQUE: Multidetector CT imaging of the abdomen and pelvis was performed using the standard protocol following bolus administration of intravenous contrast. CONTRAST:  152mL OMNIPAQUE IOHEXOL 300 MG/ML  SOLN COMPARISON:  PET-CT 12/25/2019 FINDINGS: Lower chest: Peripheral interstitial fibrosis and calcified pleural plaques. No infiltrates or effusions. No worrisome pulmonary lesions. The heart is normal in size. No pericardial effusion. Small hiatal hernia. Hepatobiliary: No focal hepatic lesions or intrahepatic biliary dilatation. The gallbladder appears normal. Mild common bile duct dilatation. It measures 12 mm in the porta hepatis and 8.5 mm in the head of the pancreas. No obvious common bile duct stone. Pancreas: Moderate inflammation in and around the pancreas diffusely consistent with acute pancreatitis. No findings suspicious for pancreatic necrosis. No pancreatic ductal dilatation. Spleen: Normal size. No focal lesions. Adrenals/Urinary Tract: Small low-attenuation left adrenal gland lesion consistent with benign adenoma. There is a large right renal cyst. No worrisome renal lesions or hydroureteronephrosis. The bladder is unremarkable. Stomach/Bowel: The stomach, duodenum, small bowel and colon are grossly normal without oral contrast. No acute inflammatory changes, mass lesions or obstructive findings. There is some inflammation in fluid surrounding the second and third portions of the duodenum secondary to the patient's pancreatitis. Colonic diverticulosis is noted without findings for acute diverticulitis. Vascular/Lymphatic: Scattered atherosclerotic calcifications involving the aorta iliac arteries and branch vessels but no aneurysm or dissection. The major venous structures are patent. No mesenteric or retroperitoneal mass or adenopathy. Reproductive: The prostate gland and seminal vesicles are unremarkable.  Other: No free pelvic fluid collections or pelvic adenopathy. No inguinal mass or adenopathy. Musculoskeletal: No significant bony findings. Scoliosis and moderate degenerative changes involving the spine. IMPRESSION: 1. CT findings consistent with acute pancreatitis. No findings suspicious for pancreatic necrosis. 2. Common bile duct dilatation new since February 2021 PET-CT. No obvious obstructing common bile duct stone by CT. MRI/MRCP may be helpful for further evaluation if clinically indicated. 3. Colonic diverticulosis without findings for acute diverticulitis. 4. Small hiatal hernia. 5. Peripheral interstitial fibrosis and calcified pleural plaques at the lung bases. 6. Aortic atherosclerosis. Aortic Atherosclerosis (ICD10-I70.0). Electronically Signed   By: Marijo Sanes M.D.   On: 02/24/2020 12:10   MR 3D Recon At Scanner  Result Date: 02/25/2020 CLINICAL DATA:  Pancreatitis suspected, dilated CBD, elevated LFTs, history of scalp melanoma EXAM: MRI ABDOMEN WITH CONTRAST (WITH MRCP) TECHNIQUE: Multiplanar multisequence MR imaging of the abdomen was performed following the administration of intravenous contrast. Heavily T2-weighted images of the biliary and pancreatic ducts were obtained, and three-dimensional MRCP images were rendered by post processing. CONTRAST:  55mL GADAVIST GADOBUTROL 1 MMOL/ML IV SOLN COMPARISON:  CT abdomen pelvis, 02/24/2020, PET-CT, 12/25/2019 FINDINGS: Examination is generally limited by breath motion artifact. Lower chest: Trace bilateral pleural effusions. Hepatobiliary: No mass or other parenchymal abnormality identified. Numerous tiny gallstones in the gallbladder. There are approximately 13 tiny gallstones measuring 3-4 mm stacked in the common bile duct to the ampulla (series 10, image 22). The common bile duct is mildly enlarged measuring up to 8 mm. Pancreas: Diffuse inflammatory stranding about the pancreas and  adjacent retroperitoneum. No pancreatic ductal dilatation.  No evidence of parenchymal hypoenhancement or fluid collection. Spleen:  Within normal limits in size and appearance. Adrenals/Urinary Tract: No masses identified. Exophytic fluid signal cyst of the right kidney. No evidence of hydronephrosis. Stomach/Bowel: Visualized portions within the abdomen are unremarkable. Vascular/Lymphatic: No pathologically enlarged lymph nodes identified. No abdominal aortic aneurysm demonstrated. Other:  None. Musculoskeletal: No suspicious bone lesions identified. IMPRESSION: 1. Numerous tiny gallstones in the gallbladder and approximately 13 gallstones measuring 3-4 mm mm stacked in the common bile duct to the ampulla. These calculi were not radiopaque by prior CT. 2. Diffuse inflammatory stranding about the pancreas and adjacent retroperitoneum, consistent with gallstone pancreatitis. No pancreatic ductal dilatation. No evidence of pancreatic parenchymal necrosis or acute pancreatic fluid collection. Electronically Signed   By: Eddie Candle M.D.   On: 02/25/2020 14:56   DG ERCP BILIARY & PANCREATIC DUCTS  Result Date: 02/26/2020 CLINICAL DATA:  Choledocholithiasis. EXAM: ERCP TECHNIQUE: Multiple spot images obtained with the fluoroscopic device and submitted for interpretation post-procedure. FLUOROSCOPY TIME:  Fluoroscopy Time:  8 minutes, 6 seconds Number of Acquired Spot Images: 16 COMPARISON:  MRI 02/25/2020 FINDINGS: Common bile duct was cannulated and retrograde cholangiogram was performed. Filling defects in the common bile duct that are compatible with stones. Common bile duct is mildly dilated. Evidence for balloon sweep for stone removal. IMPRESSION: Choledocholithiasis with stone removal. These images were submitted for radiologic interpretation only. Please see the procedural report for the amount of contrast and the fluoroscopy time utilized. Electronically Signed   By: Markus Daft M.D.   On: 02/26/2020 13:37   MR ABDOMEN WITH MRCP W CONTRAST  Result Date:  02/25/2020 CLINICAL DATA:  Pancreatitis suspected, dilated CBD, elevated LFTs, history of scalp melanoma EXAM: MRI ABDOMEN WITH CONTRAST (WITH MRCP) TECHNIQUE: Multiplanar multisequence MR imaging of the abdomen was performed following the administration of intravenous contrast. Heavily T2-weighted images of the biliary and pancreatic ducts were obtained, and three-dimensional MRCP images were rendered by post processing. CONTRAST:  66mL GADAVIST GADOBUTROL 1 MMOL/ML IV SOLN COMPARISON:  CT abdomen pelvis, 02/24/2020, PET-CT, 12/25/2019 FINDINGS: Examination is generally limited by breath motion artifact. Lower chest: Trace bilateral pleural effusions. Hepatobiliary: No mass or other parenchymal abnormality identified. Numerous tiny gallstones in the gallbladder. There are approximately 13 tiny gallstones measuring 3-4 mm stacked in the common bile duct to the ampulla (series 10, image 22). The common bile duct is mildly enlarged measuring up to 8 mm. Pancreas: Diffuse inflammatory stranding about the pancreas and adjacent retroperitoneum. No pancreatic ductal dilatation. No evidence of parenchymal hypoenhancement or fluid collection. Spleen:  Within normal limits in size and appearance. Adrenals/Urinary Tract: No masses identified. Exophytic fluid signal cyst of the right kidney. No evidence of hydronephrosis. Stomach/Bowel: Visualized portions within the abdomen are unremarkable. Vascular/Lymphatic: No pathologically enlarged lymph nodes identified. No abdominal aortic aneurysm demonstrated. Other:  None. Musculoskeletal: No suspicious bone lesions identified. IMPRESSION: 1. Numerous tiny gallstones in the gallbladder and approximately 13 gallstones measuring 3-4 mm mm stacked in the common bile duct to the ampulla. These calculi were not radiopaque by prior CT. 2. Diffuse inflammatory stranding about the pancreas and adjacent retroperitoneum, consistent with gallstone pancreatitis. No pancreatic ductal dilatation.  No evidence of pancreatic parenchymal necrosis or acute pancreatic fluid collection. Electronically Signed   By: Eddie Candle M.D.   On: 02/25/2020 14:56   DG ESOPHAGUS W DOUBLE CM (HD)  Result Date: 02/16/2020 CLINICAL DATA:  Dysphagia with gastroesophageal reflux disease. History  of scalp melanoma. EXAM: ESOPHOGRAM / BARIUM SWALLOW / BARIUM TABLET STUDY TECHNIQUE: Combined double contrast and single contrast examination performed using effervescent crystals, thick barium liquid, and thin barium liquid. The patient was observed with fluoroscopy swallowing a 13 mm barium sulphate tablet. FLUOROSCOPY TIME:  Fluoroscopy Time: 1 minutes and 12 seconds of low-dose pulsed fluoroscopy. Radiation Exposure Index (if provided by the fluoroscopic device): 12 mGy Number of Acquired Spot Images: 0 COMPARISON:  PET-CT 12/25/2019 FINDINGS: The patient swallowed the barium without difficulty. Rapid sequence imaging of the pharynx in the AP and lateral projections demonstrates no mucosal abnormalities or laryngeal penetration. The esophageal motility is normal. There is no stricture, mass or ulceration. There is a small reducible hiatal hernia. Severe gastroesophageal reflux to the level of the thoracic inlet was observed with the water siphon test. A 13 mm barium tablet was administered and passed without delay into the stomach. IMPRESSION: 1. Severe gastroesophageal reflux. 2. Small reducible hiatal hernia. 3. No evidence of esophageal stricture or ulceration. Electronically Signed   By: Richardean Sale M.D.   On: 02/16/2020 09:23      Subjective: Patient seen and examined bedside, resting comfortably.  Tolerating diet, no abdominal pain.  Ready for discharge home.  Okay for discharge per general surgery standpoint and will remove drain prior to discharge.  Patient denies headache, no fever/chills/night sweats, no nausea/vomiting/diarrhea, no chest pain, no palpitations, no shortness of breath, no abdominal pain, no  cough/congestion, no weakness, no fatigue, no paresthesias.  No acute events overnight per nursing staff.  Discharge Exam: Vitals:   02/28/20 2111 02/29/20 0601  BP: 116/62 118/65  Pulse: 69 68  Resp: 16 14  Temp: 98.2 F (36.8 C) 99 F (37.2 C)  SpO2: 96% 96%   Vitals:   02/28/20 0536 02/28/20 1353 02/28/20 2111 02/29/20 0601  BP: 134/81 116/74 116/62 118/65  Pulse: 75 71 69 68  Resp: 14 18 16 14   Temp: 97.8 F (36.6 C) 98.2 F (36.8 C) 98.2 F (36.8 C) 99 F (37.2 C)  TempSrc: Oral Oral Oral Oral  SpO2: 97% 95% 96% 96%  Weight:      Height:        General: Pt is alert, awake, not in acute distress Cardiovascular: RRR, S1/S2 +, no rubs, no gallops Respiratory: CTA bilaterally, no wheezing, no rhonchi Abdominal: Soft, NT, ND, bowel sounds + Extremities: no edema, no cyanosis    The results of significant diagnostics from this hospitalization (including imaging, microbiology, ancillary and laboratory) are listed below for reference.     Microbiology: Recent Results (from the past 240 hour(s))  Respiratory Panel by RT PCR (Flu A&B, Covid) - Nasopharyngeal Swab     Status: None   Collection Time: 02/24/20  1:27 PM   Specimen: Nasopharyngeal Swab  Result Value Ref Range Status   SARS Coronavirus 2 by RT PCR NEGATIVE NEGATIVE Final    Comment: (NOTE) SARS-CoV-2 target nucleic acids are NOT DETECTED. The SARS-CoV-2 RNA is generally detectable in upper respiratoy specimens during the acute phase of infection. The lowest concentration of SARS-CoV-2 viral copies this assay can detect is 131 copies/mL. A negative result does not preclude SARS-Cov-2 infection and should not be used as the sole basis for treatment or other patient management decisions. A negative result may occur with  improper specimen collection/handling, submission of specimen other than nasopharyngeal swab, presence of viral mutation(s) within the areas targeted by this assay, and inadequate number of  viral copies (<131 copies/mL). A negative  result must be combined with clinical observations, patient history, and epidemiological information. The expected result is Negative. Fact Sheet for Patients:  PinkCheek.be Fact Sheet for Healthcare Providers:  GravelBags.it This test is not yet ap proved or cleared by the [place] FDA and  has been authorized for detection and/or diagnosis of SARS-CoV-2 by FDA under an Emergency Use Authorization (EUA). This EUA will remain  in effect (meaning this test can be used) for the duration of the COVID-19 declaration under Section 564(b)(1) of the Act, 21 U.S.C. section 360bbb-3(b)(1), unless the authorization is terminated or revoked sooner.    Influenza A by PCR NEGATIVE NEGATIVE Final   Influenza B by PCR NEGATIVE NEGATIVE Final    Comment: (NOTE) The Xpert Xpress SARS-CoV-2/FLU/RSV assay is intended as an aid in  the diagnosis of influenza from Nasopharyngeal swab specimens and  should not be used as a sole basis for treatment. Nasal washings and  aspirates are unacceptable for Xpert Xpress SARS-CoV-2/FLU/RSV  testing. Fact Sheet for Patients: PinkCheek.be Fact Sheet for Healthcare Providers: GravelBags.it This test is not yet approved or cleared by the [place] FDA and  has been authorized for detection and/or diagnosis of SARS-CoV-2 by  FDA under an Emergency Use Authorization (EUA). This EUA will remain  in effect (meaning this test can be used) for the duration of the  Covid-19 declaration under Section 564(b)(1) of the Act, 21  U.S.C. section 360bbb-3(b)(1), unless the authorization is  terminated or revoked. Performed at [facility], [place] [address], [place], [place] [address]      Labs: BNP (last 3 results) No results for input(s): BNP in the last 8760 hours. Basic Metabolic  Panel: Recent Labs  Lab 02/24/20 0933 02/24/20 0933 02/24/20 1510 02/25/20 0556 02/26/20 0528 02/28/20 0752 02/29/20 0529  NA 139  --   --  141 140 139 137  K 4.2  --   --  3.6 3.4* 3.8 3.3*  CL 107  --   --  110 108 108 105  CO2 21*  --   --  25 24 25 25   GLUCOSE 155*  --   --  89 87 130* 77  BUN 17  --   --  17 11 14 18   CREATININE 0.87   < > 0.81 0.61 0.52* 0.70 0.76  CALCIUM 8.8*  --   --  8.3* 8.3* 8.1* 7.7*  MG  --   --   --  2.0  --   --   --    < > = values in this interval not displayed.   Liver Function Tests: Recent Labs  Lab 02/24/20 0933 02/25/20 0556 02/26/20 0528 02/29/20 0529  AST 249* 130* 66* 117*  ALT 289* 206* 138* 197*  ALKPHOS 300* 271* 220* 174*  BILITOT 3.4* 3.3* 2.1* 1.5*  PROT 7.7 6.6 6.1* 5.5*  ALBUMIN 3.9 3.1* 3.0* 2.6*   Recent Labs  Lab 02/24/20 0933 02/25/20 1742 02/26/20 0528  LIPASE 4,135* 322* 48   No results for input(s): AMMONIA in the last 168 hours. CBC: Recent Labs  Lab 02/24/20 0933 02/25/20 0556 02/26/20 0528 02/28/20 0752 02/29/20 0529  WBC 7.4 7.2 7.9 8.3 6.8  HGB 14.1 11.7* 11.5* 11.4* 10.9*  HCT 43.6 36.3* 35.4* 35.5* 33.6*  MCV 101.2* 100.8* 99.4 100.0 100.6*  PLT 220 165 162 158 138*   Cardiac Enzymes: No results for input(s): CKTOTAL, CKMB, CKMBINDEX, TROPONINI in the last 168 hours. BNP: Invalid input(s): POCBNP CBG: Recent Labs  Lab 02/24/20 2106 02/25/20 0005 02/25/20 0416 02/25/20 0756 02/25/20 2107  GLUCAP 99 78 81 77 64*   D-Dimer No results for input(s): DDIMER in the last 72 hours. Hgb A1c No results for input(s): HGBA1C in the last 72 hours. Lipid Profile No results for input(s): CHOL, HDL, LDLCALC, TRIG, CHOLHDL, LDLDIRECT in the last 72 hours. Thyroid function studies No results for input(s): TSH, T4TOTAL, T3FREE, THYROIDAB in the last 72 hours.  Invalid input(s): FREET3 Anemia work up No results for input(s): VITAMINB12, FOLATE, FERRITIN, TIBC, IRON, RETICCTPCT in the last 72  hours. Urinalysis    Component Value Date/Time   COLORURINE AMBER (A) 02/24/2020 0933   APPEARANCEUR CLEAR 02/24/2020 0933   LABSPEC 1.010 02/24/2020 0933   PHURINE 5.0 02/24/2020 0933   GLUCOSEU NEGATIVE 02/24/2020 0933   GLUCOSEU NEGATIVE 08/30/2008 0000   HGBUR NEGATIVE 02/24/2020 0933   BILIRUBINUR NEGATIVE 02/24/2020 0933   KETONESUR NEGATIVE 02/24/2020 0933   PROTEINUR NEGATIVE 02/24/2020 0933   UROBILINOGEN 0.2 mg/dL 08/30/2008 0000   NITRITE NEGATIVE 02/24/2020 0933   LEUKOCYTESUR NEGATIVE 02/24/2020 0933   Sepsis Labs Invalid input(s): PROCALCITONIN,  WBC,  LACTICIDVEN Microbiology Recent Results (from the past 240 hour(s))  Respiratory Panel by RT PCR (Flu A&B, Covid) - Nasopharyngeal Swab     Status: None   Collection Time: 02/24/20  1:27 PM   Specimen: Nasopharyngeal Swab  Result Value Ref Range Status   SARS Coronavirus 2 by RT PCR NEGATIVE NEGATIVE Final    Comment: (NOTE) SARS-CoV-2 target nucleic acids are NOT DETECTED. The SARS-CoV-2 RNA is generally detectable in upper respiratoy specimens during the acute phase of infection. The lowest concentration of SARS-CoV-2 viral copies this assay can detect is 131 copies/mL. A negative result does not preclude SARS-Cov-2 infection and should not be used as the sole basis for treatment or other patient management decisions. A negative result may occur with  improper specimen collection/handling, submission of specimen other than nasopharyngeal swab, presence of viral mutation(s) within the areas targeted by this assay, and inadequate number of viral copies (<131 copies/mL). A negative result must be combined with clinical observations, patient history, and epidemiological information. The expected result is Negative. Fact Sheet for Patients:  PinkCheek.be Fact Sheet for Healthcare Providers:  GravelBags.it This test is not yet ap proved or cleared by the  [place] FDA and  has been authorized for detection and/or diagnosis of SARS-CoV-2 by FDA under an Emergency Use Authorization (EUA). This EUA will remain  in effect (meaning this test can be used) for the duration of the COVID-19 declaration under Section 564(b)(1) of the Act, 21 U.S.C. section 360bbb-3(b)(1), unless the authorization is terminated or revoked sooner.    Influenza A by PCR NEGATIVE NEGATIVE Final   Influenza B by PCR NEGATIVE NEGATIVE Final    Comment: (NOTE) The Xpert Xpress SARS-CoV-2/FLU/RSV assay is intended as an aid in  the diagnosis of influenza from Nasopharyngeal swab specimens and  should not be used as a sole basis for treatment. Nasal washings and  aspirates are unacceptable for Xpert Xpress SARS-CoV-2/FLU/RSV  testing. Fact Sheet for Patients: PinkCheek.be Fact Sheet for Healthcare Providers: GravelBags.it This test is not yet approved or cleared by the [place] FDA and  has been authorized for detection and/or diagnosis of SARS-CoV-2 by  FDA under an Emergency Use Authorization (EUA). This EUA will remain  in effect (meaning this test can be used) for the duration of the  Covid-19 declaration under Section 564(b)(1) of the Act, 21  U.S.C. section 360bbb-3(b)(1), unless the authorization is  terminated or revoked. Performed at [facility], [place] [address] [place], [place] [address]      Time coordinating discharge: Over 30 minutes  SIGNED:   Dashaun Onstott J [place] DO  [facility] 02/29/2020, 10:49 AM

## 2020-02-29 NOTE — Discharge Instructions (Signed)

## 2020-02-29 NOTE — Progress Notes (Signed)
JP pulled at bedside with no issues.  Dry dressing placed.  Miguel Thomas 1:07 PM 02/29/2020

## 2020-02-29 NOTE — TOC Initial Note (Signed)
Transition of Care [facility]) - Initial/Assessment Note    Patient Details  Name: Miguel Thomas MRN: [id number] Date of Birth: 05/08/1941  Transition of Care [facility]) CM/SW Contact:    Lynnell Catalan, RN Phone Number: 02/29/2020, 10:58 AM  Clinical Narrative:                 This CM spoke with pt at bedside for dc planning. PT recommendations gone over with pt. Pt politely declines HH at this time. Pt states he has a RW and BSC at home currently. Pt states that he lives with his son but he is on his own during the day as son works. Pt states he feels safe being on his own during the day.   Expected Discharge Plan: Home/Self Care Barriers to Discharge: No Barriers Identified   Expected Discharge Plan and Services Expected Discharge Plan: Home/Self Care       Living arrangements for the past 2 months: Single Family Home Expected Discharge Date: 02/29/20                   Prior Living Arrangements/Services Living arrangements for the past 2 months: Single Family Home Lives with:: Adult Children Patient language and need for interpreter reviewed:: Yes Do you feel safe going back to the place where you live?: Yes      Need for Family Participation in Patient Care: Yes (Comment) Care giver support system in place?: Yes (comment)      Activities of Daily Living Home Assistive Devices/Equipment: None ADL Screening (condition at time of admission) Patient's cognitive ability adequate to safely complete daily activities?: Yes Is the patient deaf or have difficulty hearing?: Yes Does the patient have difficulty seeing, even when wearing glasses/contacts?: No Does the patient have difficulty concentrating, remembering, or making decisions?: No Patient able to express need for assistance with ADLs?: Yes Does the patient have difficulty dressing or bathing?: No Independently performs ADLs?: Yes (appropriate for developmental age) Does the patient have difficulty walking or climbing stairs?:  No Weakness of Legs: None Weakness of Arms/Hands: None  Emotional Assessment Appearance:: Appears stated age Attitude/Demeanor/Rapport: Self-Confident Affect (typically observed): Appropriate Orientation: : Oriented to Self, Oriented to Place, Oriented to  Time, Oriented to Situation      Admission diagnosis:  Acute pancreatitis [K85.90] Acute pancreatitis without infection or necrosis, unspecified pancreatitis type [K85.90] Patient Active Problem List   Diagnosis Date Noted  . Elevated LFTs 02/24/2020  . Asbestosis ([place]) 01/29/2020  . Plantar fasciitis, left 05/18/2016  . Osteopenia 07/06/2012  . Skin lesion 06/24/2012  . Abnormal EKG 06/02/2011  . OVERWEIGHT 03/07/2010  . ESOPHAGEAL STRICTURE 03/07/2010  . TESTICULAR DISORDER 08/30/2008  . GERD 04/26/2008  . History of colonic polyps 04/26/2008   PCP:  [name], NP Pharmacy:   [facility] [place] - [id number] [place] [address] Phone: [phone] Fax: [phone]     Social Determinants of Health (SDOH) Interventions    Readmission Risk Interventions No flowsheet data found.

## 2020-03-01 ENCOUNTER — Telehealth: Payer: Self-pay | Admitting: *Deleted

## 2020-03-01 NOTE — Telephone Encounter (Signed)
1st attempt. Unable to reach patient. LVM for pt to call office to schedule hospital follow up appointment.   

## 2020-03-04 ENCOUNTER — Encounter: Payer: Self-pay | Admitting: Podiatry

## 2020-03-04 ENCOUNTER — Ambulatory Visit: Payer: Medicare Other | Admitting: Podiatry

## 2020-03-04 ENCOUNTER — Other Ambulatory Visit: Payer: Self-pay

## 2020-03-04 VITALS — BP 114/64 | HR 76 | Temp 97.9°F | Resp 16

## 2020-03-04 DIAGNOSIS — L84 Corns and callosities: Secondary | ICD-10-CM

## 2020-03-04 DIAGNOSIS — M2042 Other hammer toe(s) (acquired), left foot: Secondary | ICD-10-CM | POA: Diagnosis not present

## 2020-03-04 DIAGNOSIS — M2041 Other hammer toe(s) (acquired), right foot: Secondary | ICD-10-CM | POA: Diagnosis not present

## 2020-03-04 NOTE — Progress Notes (Signed)
Subjective:   Patient ID: Miguel Thomas, male   DOB: 79 y.o.   MRN: [id number]   HPI Patient presents stating he has severe calluses on both feet and digits that are very tender and make it hard to walk.  This is been ongoing and chronic and makes it hard for him to be active.  Patient points the bottom of both feet in the third and fifth digits on both feet.  Patient does not smoke likes to be active   Review of Systems  All other systems reviewed and are negative.       Objective:  Physical Exam Vitals and nursing note reviewed.  Constitutional:      Appearance: He is well-developed.  Pulmonary:     Effort: Pulmonary effort is normal.  Musculoskeletal:        General: Normal range of motion.  Skin:    General: Skin is warm.  Neurological:     Mental Status: He is alert.     Neurovascular status intact muscle strength was found to be within normal limits with range of motion adequate.  Patient is noted to have severe calluses subthird metatarsal bilateral at the third and fifth digits of both feet with all of them being tender and hard for him to walk or wear shoe gear with comfortably.  Rotation of the digits is noted and distal displacement third digit noted     Assessment:  Severe keratotic lesions x6 with 3 on each foot that are painful when pressed and related to structural position of the digits metatarsals     Plan:  H&P reviewed all conditions and debrided the third and fifth digits of both feet and the third metatarsal bilateral with no echogenic bleeding and reappoint for routine care explaining cushion type shoes to wear at this time

## 2020-03-04 NOTE — Telephone Encounter (Signed)
I have made two attempts and have been unable to reach patient. Pt has hospital follow up scheduled w/ PCP 03/06/20 @11 .

## 2020-03-04 NOTE — Progress Notes (Signed)
   Subjective:    Patient ID: Miguel Thomas, male    DOB: 05/14/1941, 79 y.o.   MRN: [id number]  HPI    Review of Systems  All other systems reviewed and are negative.      Objective:   Physical Exam        Assessment & Plan:

## 2020-03-04 NOTE — Progress Notes (Signed)
This visit is being conducted via phone call  - after an attmept to do on video chat - due to the COVID-19 pandemic. This patient has given me verbal consent via phone to conduct this visit, patient states they are participating from their home address. Some vital signs may be absent or patient reported.   Patient identification: identified by name, DOB, and current address.    Subjective:   Miguel Thomas is a 79 y.o. male who presents for Medicare Annual/Subsequent preventive examination.  Enjoys going to church.   Review of Systems:  Cardiac Risk Factors include: advanced age (>53men, >62 women);male gender Home Safety/Smoke Alarms: Feels safe in home. Smoke alarms in place.  Lives w/ adult son.  Wife passed 11/04/2019.  Male:   CCS- No longer doing routine screening due to age.      PSA-   Lab Results  Component Value Date   PSA 0.64--[facility] 12/24/2015   PSA 0.87 01/11/2007       Objective:    Vitals: Unable to assess. This visit is enabled though telemedicine due to Covid 19.   Advanced Directives 03/05/2020 02/24/2020 02/24/2020 12/05/2019 11/29/2019 04/14/2018 04/12/2017  Does Patient Have a Medical Advance Directive? No No No No No No No  Would patient like information on creating a medical advance directive? No - Patient declined No - Patient declined No - Patient declined No - Patient declined No - Patient declined Yes (MAU/Ambulatory/Procedural Areas - Information given) Yes (MAU/Ambulatory/Procedural Areas - Information given)    Tobacco Social History   Tobacco Use  Smoking Status Never Smoker  Smokeless Tobacco Never Used     Counseling given: Not Answered   Clinical Intake: Pain : No/denies pain       Past Medical History:  Diagnosis Date  . Anemia   . Cancer ([place]) 10/2019   melanoma on scalp  . Dyspnea    had positive covid test 11-04-19  . GERD (gastroesophageal reflux disease) 12-12-04   ring like esophageal stricture with reflux  esophagitis, also in 2011  . Hiatal hernia   . History of adenomatous polyp of colon    tubular adenom's in 2006;  2011; 03-05-2015  . History of esophageal stricture    s/p  dilatation 2006  and 2012  . Osteopenia   . Penile ulcer   . Plantar fasciitis   . Prostate nodule   . Pseudophakia of both eyes   . Wears hearing aid in both ears    Past Surgical History:  Procedure Laterality Date  . APPLICATION OF A-CELL OF EXTREMITY N/A 12/05/2019   Procedure: APPLICATION OF A-CELL;  Surgeon: Stark Klein, MD;  Location: [facility];  Service: General;  Laterality: N/A;  . CARDIOVASCULAR STRESS TEST  06/11/2011   normal nuclear study w/ no ischemia/  normal LV function and wall motion, ef 58%  . CATARACT EXTRACTION W/ INTRAOCULAR LENS  IMPLANT, BILATERAL  2008 approx.  . CHOLECYSTECTOMY N/A 02/27/2020   Procedure: LAPAROSCOPIC CHOLECYSTECTOMY;  Surgeon: [name], MD;  Location: [facility];  Service: General;  Laterality: N/A;  . COLONOSCOPY  last one 03-05-2015  . ERCP N/A 02/26/2020   Procedure: ENDOSCOPIC RETROGRADE CHOLANGIOPANCREATOGRAPHY (ERCP);  Surgeon: Irene Shipper, MD;  Location: [facility];  Service: Endoscopy;  Laterality: N/A;  . ESOPHAGOGASTRODUODENOSCOPY  last one 01-06-2011  . INGUINAL HERNIA REPAIR Right 1990's  . MELANOMA EXCISION N/A 12/05/2019   Procedure: WIDE LOCAL EXCISION MELANOMA EXCISION OF SCALP;  Surgeon: Stark Klein, MD;  Location: [facility];  Service: General;  Laterality: N/A;  . PENILE BIOPSY N/A 03/12/2017   Procedure: EXCISIONAL GLANS PENILE BIOPSY;  Surgeon: Festus Aloe, MD;  Location: [facility];  Service: Urology;  Laterality: N/A;  . PROSTATE BIOPSY  11/2011   normal  . REMOVAL OF STONES  02/26/2020   Procedure: REMOVAL OF STONES;  Surgeon: Irene Shipper, MD;  Location: [facility];  Service: Endoscopy;;  . SKIN FULL THICKNESS GRAFT Left 12/05/2019   Procedure: SKIN GRAFT FULL THICKNESS FROM LEFT UPPER  CHEST TO SCALP;  Surgeon: Stark Klein, MD;  Location: [facility];  Service: General;  Laterality: Left;  . SPHINCTEROTOMY  02/26/2020   Procedure: SPHINCTEROTOMY;  Surgeon: Irene Shipper, MD;  Location: [facility];  Service: Endoscopy;;  . TONSILLECTOMY  child   Family History  Problem Relation Age of Onset  . Coronary artery disease Other   . Heart disease Other        rheumatic  . Colon cancer Neg Hx   . Esophageal cancer Neg Hx   . Rectal cancer Neg Hx   . Stomach cancer Neg Hx    Social History   Socioeconomic History  . Marital status: Widowed    Spouse name: Not on file  . Number of children: 1  . Years of education: Not on file  . Highest education level: Not on file  Occupational History  . Occupation: retired  Tobacco Use  . Smoking status: Never Smoker  . Smokeless tobacco: Never Used  Substance and Sexual Activity  . Alcohol use: No    Alcohol/week: 0.0 standard drinks  . Drug use: No  . Sexual activity: Not Currently  Other Topics Concern  . Not on file  Social History Narrative   Retired   Married (2nd marriage)   Daily Caffeine- use up to [employer] daily         Social Determinants of Health   Financial Resource Strain: Low Risk   . Difficulty of Paying Living Expenses: Not hard at all  Food Insecurity: No Food Insecurity  . Worried About Charity fundraiser in the Last Year: Never true  . Ran Out of Food in the Last Year: Never true  Transportation Needs: No Transportation Needs  . Lack of Transportation (Medical): No  . Lack of Transportation (Non-Medical): No  Physical Activity:   . Days of Exercise per Week:   . Minutes of Exercise per Session:   Stress:   . Feeling of Stress :   Social Connections:   . Frequency of Communication with Friends and Family:   . Frequency of Social Gatherings with Friends and Family:   . Attends Religious Services:   . Active Member of Clubs or Organizations:   . Attends Archivist  Meetings:   Marland Kitchen Marital Status:     Outpatient Encounter Medications as of 03/05/2020  Medication Sig  . Ascorbic Acid (VITAMIN C) 1000 MG tablet Take 1,000 mg by mouth daily.  Marland Kitchen omeprazole (PRILOSEC) 40 MG capsule TAKE 1 CAPSULE BY MOUTH ONCE DAILY BEFORE BREAKFAST  . Turmeric 500 MG CAPS Take 1 capsule by mouth daily.   . vitamin B-12 (CYANOCOBALAMIN) 500 MCG tablet Take 500 mcg by mouth daily.  Marland Kitchen zinc gluconate 50 MG tablet Take 50 mg by mouth daily.   No facility-administered encounter medications on file as of 03/05/2020.    Activities of Daily Living In your present state of health, do you have  any difficulty performing the following activities: 03/05/2020 02/24/2020  Hearing? [place]? N -  Difficulty concentrating or making decisions? N -  Walking or climbing stairs? N -  Dressing or bathing? N -  Doing errands, shopping? N N  Preparing Food and eating ? N -  Using the Toilet? N -  In the past six months, have you accidently leaked urine? N -  Do you have problems with loss of bowel control? N -  Managing your Medications? N -  Managing your Finances? N -  Housekeeping or managing your Housekeeping? N -  Some recent data might be hidden    Patient Care Team: [name], NP as PCP - General (Internal Medicine) Gatha Mayer, MD as Consulting Physician (Gastroenterology) Christy Sartorius, MD as Referring Physician (Urology) Stark Klein, MD as Consulting Physician (General Surgery) Druscilla Brownie, MD as Referring Physician (Dermatology) [name], MD as Consulting Physician (General Surgery)   Assessment:   This is a routine wellness examination for [name]. Physical assessment deferred to PCP.  Exercise Activities and Dietary recommendations Current Exercise Habits: The patient does not participate in regular exercise at present, Exercise limited by: None identified   Diet (meal preparation, eat out, water intake, caffeinated beverages, dairy products,  fruits and vegetables): 24 hr recall Breakfast: special K Lunch: snacks Dinner:  Pancakes and bacon.    Goals    . 180lb     Diet and exercise.    . Begin painting artwork again.       Fall Risk Fall Risk  03/05/2020 10/13/2019 06/01/2019 04/14/2018 04/20/2017  Falls in the past year? 0 0 (No Data) Yes No  Comment - - Emmi Telephone Survey: data to providers prior to load - -  Number falls in past yr: 0 0 (No Data) 1 -  Comment - - Emmi Telephone Survey Actual Response =  - -  Injury with Fall? 0 0 - No -  Follow up Education provided;Falls prevention discussed Falls evaluation completed - Education provided;Falls prevention discussed -   Depression Screen PHQ 2/9 Scores 03/05/2020 10/13/2019 04/14/2018 04/20/2017  PHQ - 2 Score 0 0 0 1  PHQ- 9 Score - - - 6    Cognitive Function Ad8 score reviewed for issues:  Issues making decisions:no  Less interest in hobbies / activities:no  Repeats questions, stories (family complaining):no  Trouble using ordinary gadgets (microwave, computer, phone):no  Forgets the month or year: no  Mismanaging finances: no  Remembering appts:no  Daily problems with thinking and/or memory:no Ad8 score is=0    MMSE - Mini Mental State Exam 04/14/2018  Orientation to time 5  Orientation to Place 5  Registration 3  Attention/ Calculation 5  Recall 1  Language- name 2 objects 2  Language- repeat 1  Language- follow 3 step command 3  Language- read & follow direction 1  Write a sentence 1  Copy design 1  Total score 28        Immunization History  Administered Date(s) Administered  . Influenza Whole 08/22/2007, 07/13/2009, 07/19/2010  . Influenza, High Dose Seasonal PF 07/23/2015, 08/11/2016, 08/19/2018  . Influenza-Unspecified 08/02/2014, 08/03/2019  . PFIZER SARS-COV-2 Vaccination 02/10/2020  . Pneumococcal Conjugate-13 01/22/2015  . Pneumococcal Polysaccharide-23 02/14/2007  . Td 05/19/2004, 01/22/2015  . Zoster 11/02/2009     Screening Tests Health Maintenance  Topic Date Due  . COVID-19 Vaccine (2 - Pfizer 2-dose series) 03/02/2020  . INFLUENZA VACCINE  06/02/2020  . TETANUS/TDAP  01/21/2025  .  PNA vac Low Risk Adult  Completed     Plan:    Please schedule your next medicare wellness visit with me in 1 yr.  Continue to eat heart healthy diet (full of fruits, vegetables, whole grains, lean protein, water--limit salt, fat, and sugar intake) and increase physical activity as tolerated.  Continue doing brain stimulating activities (puzzles, reading, adult coloring books, staying active) to keep memory sharp.     I have personally reviewed and noted the following in the patient's chart:   . Medical and social history . Use of alcohol, tobacco or illicit drugs  . Current medications and supplements . Functional ability and status . Nutritional status . Physical activity . Advanced directives . List of other physicians . Hospitalizations, surgeries, and ER visits in previous 12 months . Vitals . Screenings to include cognitive, depression, and falls . Referrals and appointments  In addition, I have reviewed and discussed with patient certain preventive protocols, quality metrics, and best practice recommendations. A written personalized care plan for preventive services as well as general preventive health recommendations were provided to patient.     Shela Nevin, [place]  03/05/2020

## 2020-03-05 ENCOUNTER — Ambulatory Visit (INDEPENDENT_AMBULATORY_CARE_PROVIDER_SITE_OTHER): Payer: Medicare Other | Admitting: *Deleted

## 2020-03-05 ENCOUNTER — Encounter: Payer: Self-pay | Admitting: *Deleted

## 2020-03-05 DIAGNOSIS — Z0000 Encounter for general adult medical examination without abnormal findings: Secondary | ICD-10-CM | POA: Diagnosis not present

## 2020-03-05 NOTE — Patient Instructions (Addendum)
Please schedule your next medicare wellness visit with me in 1 yr.  Continue to eat heart healthy diet (full of fruits, vegetables, whole grains, lean protein, water--limit salt, fat, and sugar intake) and increase physical activity as tolerated.  Continue doing brain stimulating activities (puzzles, reading, adult coloring books, staying active) to keep memory sharp.    Miguel Thomas , Thank you for taking time to come for your Medicare Wellness Visit. I appreciate your ongoing commitment to your health goals. Please review the following plan we discussed and let me know if I can assist you in the future.   These are the goals we discussed: Goals    . Begin painting artwork again.       This is a list of the screening recommended for you and due dates:  Health Maintenance  Topic Date Due  . COVID-19 Vaccine (2 - Pfizer 2-dose series) 03/02/2020  . Flu Shot  06/02/2020  . Tetanus Vaccine  01/21/2025  . Pneumonia vaccines  Completed    Preventive Care 4 Years and Older, Male Preventive care refers to lifestyle choices and visits with your health care provider that can promote health and wellness. This includes:  A yearly physical exam. This is also called an annual well check.  Regular dental and eye exams.  Immunizations.  Screening for certain conditions.  Healthy lifestyle choices, such as diet and exercise. What can I expect for my preventive care visit? Physical exam Your health care provider will check:  Height and weight. These may be used to calculate body mass index (BMI), which is a measurement that tells if you are at a healthy weight.  Heart rate and blood pressure.  Your skin for abnormal spots. Counseling Your health care provider may ask you questions about:  Alcohol, tobacco, and drug use.  Emotional well-being.  Home and relationship well-being.  Sexual activity.  Eating habits.  History of falls.  Memory and ability to understand  (cognition).  Work and work Statistician. What immunizations do I need?  Influenza (flu) vaccine  This is recommended every year. Tetanus, diphtheria, and pertussis (Tdap) vaccine  You may need a Td booster every 10 years. Varicella (chickenpox) vaccine  You may need this vaccine if you have not already been vaccinated. Zoster (shingles) vaccine  You may need this after age 35. Pneumococcal conjugate (PCV13) vaccine  One dose is recommended after age 54. Pneumococcal polysaccharide (PPSV23) vaccine  One dose is recommended after age 61. Measles, mumps, and rubella (MMR) vaccine  You may need at least one dose of MMR if you were born in 1957 or later. You may also need a second dose. Meningococcal conjugate (MenACWY) vaccine  You may need this if you have certain conditions. Hepatitis A vaccine  You may need this if you have certain conditions or if you travel or work in places where you may be exposed to hepatitis A. Hepatitis B vaccine  You may need this if you have certain conditions or if you travel or work in places where you may be exposed to hepatitis B. Haemophilus influenzae type b (Hib) vaccine  You may need this if you have certain conditions. You may receive vaccines as individual doses or as more than one vaccine together in one shot (combination vaccines). Talk with your health care provider about the risks and benefits of combination vaccines. What tests do I need? Blood tests  Lipid and cholesterol levels. These may be checked every 5 years, or more frequently depending on  your overall health.  Hepatitis C test.  Hepatitis B test. Screening  Lung cancer screening. You may have this screening every year starting at age 32 if you have a 30-pack-year history of smoking and currently smoke or have quit within the past 15 years.  Colorectal cancer screening. All adults should have this screening starting at age 78 and continuing until age 69. Your health  care provider may recommend screening at age 54 if you are at increased risk. You will have tests every 1-10 years, depending on your results and the type of screening test.  Prostate cancer screening. Recommendations will vary depending on your family history and other risks.  Diabetes screening. This is done by checking your blood sugar (glucose) after you have not eaten for a while (fasting). You may have this done every 1-3 years.  Abdominal aortic aneurysm (AAA) screening. You may need this if you are a current or former smoker.  Sexually transmitted disease (STD) testing. Follow these instructions at home: Eating and drinking  Eat a diet that includes fresh fruits and vegetables, whole grains, lean protein, and low-fat dairy products. Limit your intake of foods with high amounts of sugar, saturated fats, and salt.  Take vitamin and mineral supplements as recommended by your health care provider.  Do not drink alcohol if your health care provider tells you not to drink.  If you drink alcohol: ? Limit how much you have to 0-2 drinks a day. ? Be aware of how much alcohol is in your drink. In the [place] one drink equals one 12 oz bottle of beer (355 mL), one 5 oz glass of wine (148 mL), or one 1 oz glass of hard liquor (44 mL). Lifestyle  Take daily care of your teeth and gums.  Stay active. Exercise for at least 30 minutes on 5 or more days each week.  Do not use any products that contain nicotine or tobacco, such as cigarettes, e-cigarettes, and chewing tobacco. If you need help quitting, ask your health care provider.  If you are sexually active, practice safe sex. Use a condom or other form of protection to prevent STIs (sexually transmitted infections).  Talk with your health care provider about taking a low-dose aspirin or statin. What's next?  Visit your health care provider once a year for a well check visit.  Ask your health care provider how often you should have your  eyes and teeth checked.  Stay up to date on all vaccines. This information is not intended to replace advice given to you by your health care provider. Make sure you discuss any questions you have with your health care provider. Document Revised: 10/13/2018 Document Reviewed: 10/13/2018 Elsevier Patient Education  2020 [employer].

## 2020-03-06 ENCOUNTER — Other Ambulatory Visit: Payer: Self-pay

## 2020-03-06 ENCOUNTER — Ambulatory Visit (INDEPENDENT_AMBULATORY_CARE_PROVIDER_SITE_OTHER): Payer: Medicare Other | Admitting: Family

## 2020-03-06 ENCOUNTER — Encounter: Payer: Self-pay | Admitting: Family

## 2020-03-06 VITALS — BP 126/62 | HR 73 | Temp 97.3°F | Resp 16 | Ht 70.5 in | Wt 168.0 lb

## 2020-03-06 DIAGNOSIS — D649 Anemia, unspecified: Secondary | ICD-10-CM

## 2020-03-06 DIAGNOSIS — K851 Biliary acute pancreatitis without necrosis or infection: Secondary | ICD-10-CM | POA: Diagnosis not present

## 2020-03-06 LAB — VITAMIN B12: Vitamin B-12: 560 pg/mL (ref 211–911)

## 2020-03-06 LAB — CBC WITH DIFFERENTIAL/PLATELET
Basophils Absolute: 0 10*3/uL (ref 0.0–0.1)
Basophils Relative: 0.4 % (ref 0.0–3.0)
Eosinophils Absolute: 0.1 10*3/uL (ref 0.0–0.7)
Eosinophils Relative: 1.8 % (ref 0.0–5.0)
HCT: 32.8 % — ABNORMAL LOW (ref 39.0–52.0)
Hemoglobin: 11.2 g/dL — ABNORMAL LOW (ref 13.0–17.0)
Lymphocytes Relative: 22.5 % (ref 12.0–46.0)
Lymphs Abs: 1.6 10*3/uL (ref 0.7–4.0)
MCHC: 34.3 g/dL (ref 30.0–36.0)
MCV: 96.7 fl (ref 78.0–100.0)
Monocytes Absolute: 0.7 10*3/uL (ref 0.1–1.0)
Monocytes Relative: 10.1 % (ref 3.0–12.0)
Neutro Abs: 4.7 10*3/uL (ref 1.4–7.7)
Neutrophils Relative %: 65.2 % (ref 43.0–77.0)
Platelets: 230 10*3/uL (ref 150.0–400.0)
RBC: 3.39 Mil/uL — ABNORMAL LOW (ref 4.22–5.81)
RDW: 14.2 % (ref 11.5–15.5)
WBC: 7.2 10*3/uL (ref 4.0–10.5)

## 2020-03-06 LAB — FERRITIN: Ferritin: 332.6 ng/mL — ABNORMAL HIGH (ref 22.0–322.0)

## 2020-03-06 LAB — COMPREHENSIVE METABOLIC PANEL
ALT: 68 U/L — ABNORMAL HIGH (ref 0–53)
AST: 30 U/L (ref 0–37)
Albumin: 3.3 g/dL — ABNORMAL LOW (ref 3.5–5.2)
Alkaline Phosphatase: 170 U/L — ABNORMAL HIGH (ref 39–117)
BUN: 16 mg/dL (ref 6–23)
CO2: 29 mEq/L (ref 19–32)
Calcium: 8.2 mg/dL — ABNORMAL LOW (ref 8.4–10.5)
Chloride: 103 mEq/L (ref 96–112)
Creatinine, Ser: 0.76 mg/dL (ref 0.40–1.50)
GFR: 98.38 mL/min (ref >60.00)
Glucose, Bld: 93 mg/dL (ref 70–99)
Potassium: 4 mEq/L (ref 3.5–5.1)
Sodium: 137 mEq/L (ref 135–145)
Total Bilirubin: 1 mg/dL (ref 0.2–1.2)
Total Protein: 6.5 g/dL (ref 6.0–8.3)

## 2020-03-06 LAB — IRON: Iron: 29 ug/dL — ABNORMAL LOW (ref 42–165)

## 2020-03-06 LAB — LIPASE: Lipase: 54 U/L (ref 11.0–59.0)

## 2020-03-06 NOTE — Progress Notes (Signed)
Subjective:    Patient ID: Miguel Thomas, male    DOB: Jan 16, 1941, 79 y.o.   MRN: [id number]  HPI  Patient is an 79 yr old male who presents today for hospital follow up.   Patient was admitted 02/24/20 with acute pancreatitis. CBD dilation was noted on CT. MRCP was performed showing numerous gallstones. He underwent ERCP with biliary sphincterotomy and CBD stone extraction on 02/26/20.  This was followed by a laparoscopic cholecystectomy on 02/27/20.   He reports that he had some coughing and sore throat following his general anesthesia.  Now improved.    Review of Systems    see HPI  Past Medical History:  Diagnosis Date  . Anemia   . Cancer ([place]) 10/2019   melanoma on scalp  . Dyspnea    had positive covid test 11-04-19  . GERD (gastroesophageal reflux disease) 12-12-04   ring like esophageal stricture with reflux esophagitis, also in 2011  . Hiatal hernia   . History of adenomatous polyp of colon    tubular adenom's in 2006;  2011; 03-05-2015  . History of esophageal stricture    s/p  dilatation 2006  and 2012  . Osteopenia   . Penile ulcer   . Plantar fasciitis   . Prostate nodule   . Pseudophakia of both eyes   . Wears hearing aid in both ears      Social History   Socioeconomic History  . Marital status: Widowed    Spouse name: Not on file  . Number of children: 1  . Years of education: Not on file  . Highest education level: Not on file  Occupational History  . Occupation: retired  Tobacco Use  . Smoking status: Never Smoker  . Smokeless tobacco: Never Used  Substance and Sexual Activity  . Alcohol use: No    Alcohol/week: 0.0 standard drinks  . Drug use: No  . Sexual activity: Not Currently  Other Topics Concern  . Not on file  Social History Narrative   Retired   Married (2nd marriage)   Daily Caffeine- use up to [employer] daily         Social Determinants of Health   Financial Resource Strain: Low Risk   . Difficulty of Paying Living Expenses: Not  hard at all  Food Insecurity: No Food Insecurity  . Worried About Charity fundraiser in the Last Year: Never true  . Ran Out of Food in the Last Year: Never true  Transportation Needs: No Transportation Needs  . Lack of Transportation (Medical): No  . Lack of Transportation (Non-Medical): No  Physical Activity:   . Days of Exercise per Week:   . Minutes of Exercise per Session:   Stress:   . Feeling of Stress :   Social Connections:   . Frequency of Communication with Friends and Family:   . Frequency of Social Gatherings with Friends and Family:   . Attends Religious Services:   . Active Member of Clubs or Organizations:   . Attends Archivist Meetings:   Marland Kitchen Marital Status:   Intimate Partner Violence:   . Fear of Current or Ex-Partner:   . Emotionally Abused:   Marland Kitchen Physically Abused:   . Sexually Abused:     Past Surgical History:  Procedure Laterality Date  . APPLICATION OF A-CELL OF EXTREMITY N/A 12/05/2019   Procedure: APPLICATION OF A-CELL;  Surgeon: Stark Klein, MD;  Location: [place];  Service: General;  Laterality: N/A;  .  CARDIOVASCULAR STRESS TEST  06/11/2011   normal nuclear study w/ no ischemia/  normal LV function and wall motion, ef 58%  . CATARACT EXTRACTION W/ INTRAOCULAR LENS  IMPLANT, BILATERAL  2008 approx.  . CHOLECYSTECTOMY N/A 02/27/2020   Procedure: LAPAROSCOPIC CHOLECYSTECTOMY;  Surgeon: [name], MD;  [facility];  Service: General;  Laterality: N/A;  . COLONOSCOPY  last one 03-05-2015  . ERCP N/A 02/26/2020   Procedure: ENDOSCOPIC RETROGRADE CHOLANGIOPANCREATOGRAPHY (ERCP);  Surgeon: Irene Shipper, MD;  Location: [facility];  Service: Endoscopy;  Laterality: N/A;  . ESOPHAGOGASTRODUODENOSCOPY  last one 01-06-2011  . INGUINAL HERNIA REPAIR Right 1990's  . MELANOMA EXCISION N/A 12/05/2019   Procedure: WIDE LOCAL EXCISION MELANOMA EXCISION OF SCALP;  Surgeon: Stark Klein, MD;  Location: [place];   Service: General;  Laterality: N/A;  . PENILE BIOPSY N/A 03/12/2017   Procedure: EXCISIONAL GLANS PENILE BIOPSY;  Surgeon: Festus Aloe, MD;  Location: [facility];  Service: Urology;  Laterality: N/A;  . PROSTATE BIOPSY  11/2011   normal  . REMOVAL OF STONES  02/26/2020   Procedure: REMOVAL OF STONES;  Surgeon: Irene Shipper, MD;  [facility];  Service: Endoscopy;;  . SKIN FULL THICKNESS GRAFT Left 12/05/2019   Procedure: SKIN GRAFT FULL THICKNESS FROM LEFT UPPER CHEST TO SCALP;  Surgeon: Stark Klein, MD;  Location: [place];  Service: General;  Laterality: Left;  . SPHINCTEROTOMY  02/26/2020   Procedure: SPHINCTEROTOMY;  Surgeon: Irene Shipper, MD;  Location: [facility];  Service: Endoscopy;;  . TONSILLECTOMY  child    Family History  Problem Relation Age of Onset  . Coronary artery disease Other   . Heart disease Other        rheumatic  . Colon cancer Neg Hx   . Esophageal cancer Neg Hx   . Rectal cancer Neg Hx   . Stomach cancer Neg Hx     No Known Allergies  Current Outpatient Medications on File Prior to Visit  Medication Sig Dispense Refill  . Ascorbic Acid (VITAMIN C) 1000 MG tablet Take 1,000 mg by mouth daily.    Marland Kitchen omeprazole (PRILOSEC) 40 MG capsule TAKE 1 CAPSULE BY MOUTH ONCE DAILY BEFORE BREAKFAST 30 capsule 11  . Turmeric 500 MG CAPS Take 1 capsule by mouth daily.     . vitamin B-12 (CYANOCOBALAMIN) 500 MCG tablet Take 500 mcg by mouth daily.    Marland Kitchen zinc gluconate 50 MG tablet Take 50 mg by mouth daily.     No current facility-administered medications on file prior to visit.    BP 126/62 (BP Location: Right Arm, Patient Position: Sitting, Cuff Size: Small)   Pulse 73   Temp (!) 97.3 F (36.3 C) (Temporal)   Resp 16   Ht 5' 10.5" (1.791 m)   Wt 168 lb (76.2 kg)   SpO2 93%   BMI 23.76 kg/m    Objective:   Physical Exam Constitutional:      General: He is not in acute distress.    Appearance: He is  well-developed.  HENT:     Head: Normocephalic and atraumatic.  Cardiovascular:     Rate and Rhythm: Normal rate and regular rhythm.     Heart sounds: No murmur.  Pulmonary:     Effort: Pulmonary effort is normal. No respiratory distress.     Breath sounds: Normal breath sounds. No wheezing or rales.  Abdominal:     General: Bowel sounds are normal.  Palpations: Abdomen is soft.     Tenderness: There is no abdominal tenderness.     Comments: Abdominal incisions clean/dry/intact  Skin:    General: Skin is warm and dry.  Neurological:     Mental Status: He is alert and oriented to person, place, and time.  Psychiatric:        Behavior: Behavior normal.        Thought Content: Thought content normal.           Assessment & Plan:   Gallbladder pancreatitis- s/p cholecystectomy.  Clinically stable.  Obtain follow up lab work.   Anemia- check cbc, iron, b12, ifob

## 2020-03-06 NOTE — Patient Instructions (Signed)
Please complete lab work prior to leaving.   

## 2020-03-07 ENCOUNTER — Other Ambulatory Visit (INDEPENDENT_AMBULATORY_CARE_PROVIDER_SITE_OTHER): Payer: Medicare Other

## 2020-03-07 DIAGNOSIS — D649 Anemia, unspecified: Secondary | ICD-10-CM | POA: Diagnosis not present

## 2020-03-07 LAB — FECAL OCCULT BLOOD, IMMUNOCHEMICAL: Fecal Occult Bld: NEGATIVE

## 2020-03-11 ENCOUNTER — Ambulatory Visit: Payer: Medicare Other | Attending: Internal Medicine

## 2020-03-11 DIAGNOSIS — Z23 Encounter for immunization: Secondary | ICD-10-CM

## 2020-03-11 NOTE — Progress Notes (Signed)
   Covid-19 Vaccination Clinic  Name:  Miguel Thomas    MRN: [id number] DOB: [birth year]  03/11/2020  [name] was observed post Covid-19 immunization for 15 minutes without incident. He was provided with Vaccine Information Sheet and instruction to access the V-Safe system.   [name] was instructed to call 911 with any severe reactions post vaccine: Marland Kitchen Difficulty breathing  . Swelling of face and throat  . A fast heartbeat  . A bad rash all over body  . Dizziness and weakness   Immunizations Administered    Name Date Dose VIS Date Route   Pfizer COVID-19 Vaccine 03/11/2020  9:10 AM 0.3 mL 12/27/2018 Intramuscular   Manufacturer: [place]   Lot: [id number]   [place]: [id number]

## 2020-03-18 DIAGNOSIS — L570 Actinic keratosis: Secondary | ICD-10-CM | POA: Diagnosis not present

## 2020-03-18 DIAGNOSIS — L578 Other skin changes due to chronic exposure to nonionizing radiation: Secondary | ICD-10-CM | POA: Diagnosis not present

## 2020-05-13 DIAGNOSIS — C434 Malignant melanoma of scalp and neck: Secondary | ICD-10-CM | POA: Diagnosis not present

## 2020-05-20 DIAGNOSIS — Z85820 Personal history of malignant melanoma of skin: Secondary | ICD-10-CM | POA: Diagnosis not present

## 2020-05-20 DIAGNOSIS — Z859 Personal history of malignant neoplasm, unspecified: Secondary | ICD-10-CM | POA: Diagnosis not present

## 2020-05-20 DIAGNOSIS — L570 Actinic keratosis: Secondary | ICD-10-CM | POA: Diagnosis not present

## 2020-07-05 IMAGING — CT NM PET TUM IMG INITIAL (PI) WHOLE BODY
2 of 9 series · 4 of 25 positions shown · non-contrast
Comparison: None.

CLINICAL DATA: Initial treatment strategy for melanoma of scalp,
status post excision and skin graft from upper chest.

EXAM:
NUCLEAR MEDICINE PET WHOLE BODY
TECHNIQUE: 9.2 mCi F-18 FDG was injected intravenously. Full-ring PET imaging
was performed from the skull base to thigh after the radiotracer. CT
data was obtained and used for attenuation correction and anatomic
localization.
Fasting blood glucose: 81 mg/dl

[Series 2: ac ct wb 5.0 hd_fov · axial · 5.0mm · 1.52mm/px · z∈[+254,+894]mm · 2 of 476 slices shown]
[im 159/476  soft-tissue]
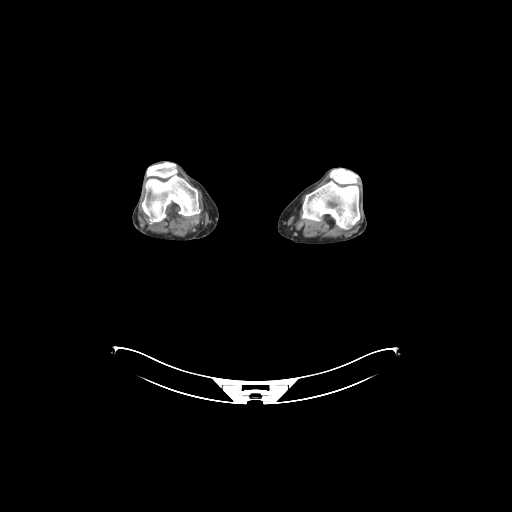
[im 317/476  soft-tissue]
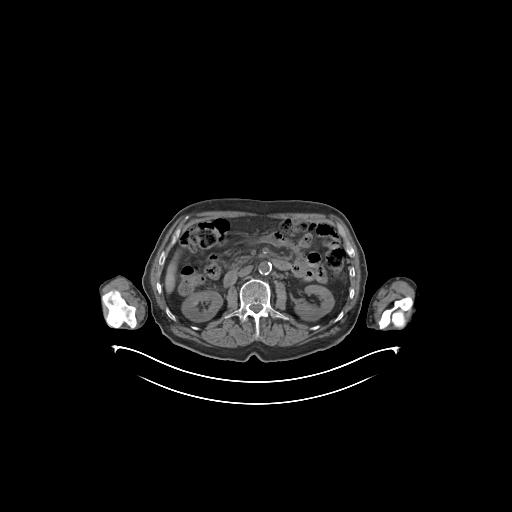

[Series 4: ct wb 5.0 hd_fov · axial · 5.0mm · 1.17mm/px · z∈[+250,+898]mm · 2 of 473 slices shown]
[im 158/473  soft-tissue]
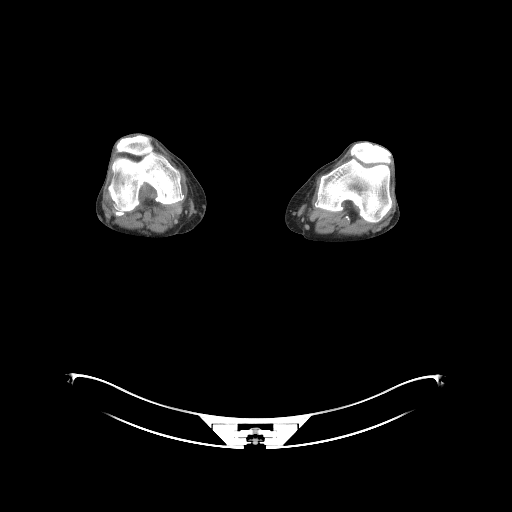
[im 315/473  soft-tissue]
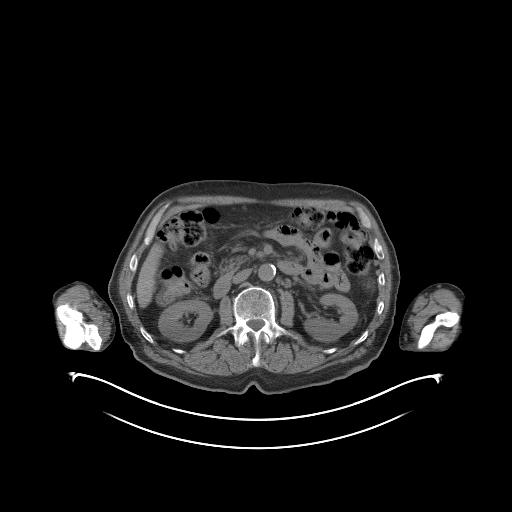

[4 of 25 positions shown; findings below may reference images not displayed]

FINDINGS: Mediastinal blood pool activity: SUV max

HEAD/NECK: Hypermetabolism and soft tissue thickening at the vertex
of the scalp measures a S.U.V. max of 3.4, including on [DATE].

No cervical nodal hypermetabolism.

Incidental CT findings: No cervical adenopathy. Bilateral carotid
atherosclerosis.

CHEST: A node within the azygoesophageal recess measures 6 mm and a
S.U.V. max of 3.0 on 107/2. No pulmonary parenchymal
hypermetabolism.

Incidental CT findings: Aortic and coronary artery atherosclerosis.
Mild cardiomegaly. Bilateral calcified pleural plaques. Right upper
lobe scarring. Interstitial lung disease, as evidenced by subpleural
reticulation, slightly basilar predominant.

ABDOMEN/PELVIS: Minimal left adrenal nodularity and hypermetabolism.
Example at 9 mm and a S.U.V. max of 3.6 on 143/4. Low-density,
favoring an adenoma.

No abdominopelvic nodal hypermetabolism.

Incidental CT findings: Upper pole posterior right renal 6.3 cm
low-density lesion is likely a cyst. Abdominal aortic
atherosclerosis. Mild prostatomegaly. Fat containing left inguinal
hernia.

SKELETON: No abnormal marrow activity.

Incidental CT findings: none

EXTREMITIES: Low-level hypermetabolism within the right hamstring
musculature may be related to muscular strain or prior trauma.

Incidental CT findings: none
IMPRESSION: 1. Hypermetabolism about the scalp at the vertex is presumably the
site of prior resection.
2. No typical findings of hypermetabolic metastasis. Small, mildly
hypermetabolic mediastinal node is likely reactive.
3. Asbestos related pleural disease with interstitial lung disease,
possibly asbestosis. Consider dedicated high-resolution chest CT
follow-up at 1 year.
4. Coronary artery atherosclerosis. Aortic Atherosclerosis
([id number]).

## 2020-07-15 DIAGNOSIS — C434 Malignant melanoma of scalp and neck: Secondary | ICD-10-CM | POA: Diagnosis not present

## 2020-07-31 ENCOUNTER — Other Ambulatory Visit: Payer: Self-pay

## 2020-07-31 ENCOUNTER — Encounter: Payer: Self-pay | Admitting: Family

## 2020-07-31 ENCOUNTER — Ambulatory Visit (INDEPENDENT_AMBULATORY_CARE_PROVIDER_SITE_OTHER): Payer: Medicare Other | Admitting: Family

## 2020-07-31 VITALS — BP 122/62 | HR 83 | Temp 98.0°F | Resp 15 | Ht 70.5 in | Wt 183.0 lb

## 2020-07-31 DIAGNOSIS — Z23 Encounter for immunization: Secondary | ICD-10-CM

## 2020-07-31 DIAGNOSIS — C434 Malignant melanoma of scalp and neck: Secondary | ICD-10-CM | POA: Diagnosis not present

## 2020-07-31 DIAGNOSIS — D509 Iron deficiency anemia, unspecified: Secondary | ICD-10-CM

## 2020-07-31 DIAGNOSIS — K219 Gastro-esophageal reflux disease without esophagitis: Secondary | ICD-10-CM | POA: Diagnosis not present

## 2020-07-31 MED ORDER — SHINGRIX 50 MCG/0.5ML IM SUSR: INTRAMUSCULAR | 1 refills | Status: DC

## 2020-07-31 NOTE — Progress Notes (Signed)
Subjective:    Patient ID: Miguel Thomas, male    DOB: [birth year], 79 y.o.   MRN: [id number]  HPI  Patient is an 79 yr old male who presents today for follow up.  Melanoma- of scalp (diagnosed on 10/17/19). S/p resection. Reports that the wound continues to heal.   GERD-maintained on omeprazole.   Anemia-FOB was negative last visit. b12 normal. Iron level was mildly low.   Lab Results  Component Value Date   WBC 7.2 03/06/2020   HGB 11.2 (L) 03/06/2020   HCT 32.8 (L) 03/06/2020   MCV 96.7 03/06/2020   PLT 230.0 03/06/2020   Review of Systems See HPI  Past Medical History:  Diagnosis Date  . Anemia   . Cancer ([place]) 10/2019   melanoma on scalp  . Dyspnea    had positive covid test 11-04-19  . GERD (gastroesophageal reflux disease) 12-12-04   ring like esophageal stricture with reflux esophagitis, also in 2011  . Hiatal hernia   . History of adenomatous polyp of colon    tubular adenom's in 2006;  2011; 03-05-2015  . History of esophageal stricture    s/p  dilatation 2006  and 2012  . Osteopenia   . Penile ulcer   . Plantar fasciitis   . Prostate nodule   . Pseudophakia of both eyes   . Wears hearing aid in both ears      Social History   Socioeconomic History  . Marital status: Widowed    Spouse name: Not on file  . Number of children: 1  . Years of education: Not on file  . Highest education level: Not on file  Occupational History  . Occupation: retired  Tobacco Use  . Smoking status: Never Smoker  . Smokeless tobacco: Never Used  Substance and Sexual Activity  . Alcohol use: No    Alcohol/week: 0.0 standard drinks  . Drug use: No  . Sexual activity: Not Currently  Other Topics Concern  . Not on file  Social History Narrative   Retired   Married (2nd marriage)   Daily Caffeine- use up to [employer] daily         Social Determinants of Health   Financial Resource Strain: Low Risk   . Difficulty of Paying Living Expenses: Not hard at all  Food  Insecurity: No Food Insecurity  . Worried About Charity fundraiser in the Last Year: Never true  . Ran Out of Food in the Last Year: Never true  Transportation Needs: No Transportation Needs  . Lack of Transportation (Medical): No  . Lack of Transportation (Non-Medical): No  Physical Activity:   . Days of Exercise per Week: Not on file  . Minutes of Exercise per Session: Not on file  Stress:   . Feeling of Stress : Not on file  Social Connections:   . Frequency of Communication with Friends and Family: Not on file  . Frequency of Social Gatherings with Friends and Family: Not on file  . Attends Religious Services: Not on file  . Active Member of Clubs or Organizations: Not on file  . Attends Archivist Meetings: Not on file  . Marital Status: Not on file  Intimate Partner Violence:   . Fear of Current or Ex-Partner: Not on file  . Emotionally Abused: Not on file  . Physically Abused: Not on file  . Sexually Abused: Not on file    Past Surgical History:  Procedure Laterality Date  . APPLICATION OF  A-CELL OF EXTREMITY N/A 12/05/2019   Procedure: APPLICATION OF A-CELL;  Surgeon: Stark Klein, MD;  Location: [place];  Service: General;  Laterality: N/A;  . CARDIOVASCULAR STRESS TEST  06/11/2011   normal nuclear study w/ no ischemia/  normal LV function and wall motion, ef 58%  . CATARACT EXTRACTION W/ INTRAOCULAR LENS  IMPLANT, BILATERAL  2008 approx.  . CHOLECYSTECTOMY N/A 02/27/2020   Procedure: LAPAROSCOPIC CHOLECYSTECTOMY;  Surgeon: [name], MD;  [facility];  Service: General;  Laterality: N/A;  . COLONOSCOPY  last one 03-05-2015  . ERCP N/A 02/26/2020   Procedure: ENDOSCOPIC RETROGRADE CHOLANGIOPANCREATOGRAPHY (ERCP);  Surgeon: Irene Shipper, MD;  Location: [facility];  Service: Endoscopy;  Laterality: N/A;  . ESOPHAGOGASTRODUODENOSCOPY  last one 01-06-2011  . INGUINAL HERNIA REPAIR Right 1990's  . MELANOMA EXCISION N/A 12/05/2019    Procedure: WIDE LOCAL EXCISION MELANOMA EXCISION OF SCALP;  Surgeon: Stark Klein, MD;  Location: [place];  Service: General;  Laterality: N/A;  . PENILE BIOPSY N/A 03/12/2017   Procedure: EXCISIONAL GLANS PENILE BIOPSY;  Surgeon: Festus Aloe, MD;  Location: [facility];  Service: Urology;  Laterality: N/A;  . PROSTATE BIOPSY  11/2011   normal  . REMOVAL OF STONES  02/26/2020   Procedure: REMOVAL OF STONES;  Surgeon: Irene Shipper, MD;  [facility];  Service: Endoscopy;;  . SKIN FULL THICKNESS GRAFT Left 12/05/2019   Procedure: SKIN GRAFT FULL THICKNESS FROM LEFT UPPER CHEST TO SCALP;  Surgeon: Stark Klein, MD;  Location: [place];  Service: General;  Laterality: Left;  . SPHINCTEROTOMY  02/26/2020   Procedure: SPHINCTEROTOMY;  Surgeon: Irene Shipper, MD;  Location: [facility];  Service: Endoscopy;;  . TONSILLECTOMY  child    Family History  Problem Relation Age of Onset  . Coronary artery disease Other   . Heart disease Other        rheumatic  . Colon cancer Neg Hx   . Esophageal cancer Neg Hx   . Rectal cancer Neg Hx   . Stomach cancer Neg Hx     No Known Allergies  Current Outpatient Medications on File Prior to Visit  Medication Sig Dispense Refill  . omeprazole (PRILOSEC) 40 MG capsule TAKE 1 CAPSULE BY MOUTH ONCE DAILY BEFORE BREAKFAST 30 capsule 11   No current facility-administered medications on file prior to visit.    BP 122/62 (BP Location: Left Arm, Patient Position: Sitting, Cuff Size: Normal)   Pulse 83   Temp 98 F (36.7 C) (Temporal)   Resp 15   Ht 5' 10.5" (1.791 m)   Wt 183 lb (83 kg)   SpO2 96%   BMI 25.89 kg/m       Objective:   Physical Exam Constitutional:      General: He is not in acute distress.    Appearance: He is well-developed.  HENT:     Head:     Comments: Some dry scabbing at the top of the scalp Cardiovascular:     Rate and Rhythm: Normal rate and regular rhythm.      Heart sounds: No murmur heard.   Pulmonary:     Effort: Pulmonary effort is normal. No respiratory distress.     Breath sounds: Normal breath sounds. No wheezing or rales.  Skin:    General: Skin is warm and dry.  Neurological:     Mental Status: He is alert and oriented to person, place, and time.  Psychiatric:  Behavior: Behavior normal.        Thought Content: Thought content normal.           Assessment & Plan:  Malignant melanoma- s/p resection (deep margins negative per surgeon).  GERD- stable on omeprazole. Continue 40mg  once daily.  Iron deficiency anemia- will obtain follow up iron level, cbc.    High dose flu shot today.  This visit occurred during the SARS-CoV-2 public health emergency.  Safety protocols were in place, including screening questions prior to the visit, additional usage of staff PPE, and extensive cleaning of exam room while observing appropriate contact time as indicated for disinfecting solutions.

## 2020-07-31 NOTE — Patient Instructions (Signed)
Please complete lab work prior to leaving.   

## 2020-08-01 ENCOUNTER — Encounter: Payer: Self-pay | Admitting: Family

## 2020-08-01 LAB — CBC WITH DIFFERENTIAL/PLATELET
Absolute Monocytes: 536 cells/uL (ref 200–950)
Basophils Absolute: 40 cells/uL (ref 0–200)
Basophils Relative: 0.7 %
Eosinophils Absolute: 473 cells/uL (ref 15–500)
Eosinophils Relative: 8.3 %
HCT: 37.1 % — ABNORMAL LOW (ref 38.5–50.0)
Hemoglobin: 12.7 g/dL — ABNORMAL LOW (ref 13.2–17.1)
Lymphs Abs: 1300 cells/uL (ref 850–3900)
MCH: 33.2 pg — ABNORMAL HIGH (ref 27.0–33.0)
MCHC: 34.2 g/dL (ref 32.0–36.0)
MCV: 96.9 fL (ref 80.0–100.0)
MPV: 9.7 fL (ref 7.5–12.5)
Monocytes Relative: 9.4 %
Neutro Abs: 3352 cells/uL (ref 1500–7800)
Neutrophils Relative %: 58.8 %
Platelets: 205 10*3/uL (ref 140–400)
RBC: 3.83 10*6/uL — ABNORMAL LOW (ref 4.20–5.80)
RDW: 13.7 % (ref 11.0–15.0)
Total Lymphocyte: 22.8 %
WBC: 5.7 10*3/uL (ref 3.8–10.8)

## 2020-08-01 LAB — FERRITIN: Ferritin: 74 ng/mL (ref 24–380)

## 2020-08-01 LAB — IRON: Iron: 93 ug/dL (ref 50–180)

## 2020-08-01 NOTE — Progress Notes (Signed)
Mailed out to patient 

## 2020-10-18 DIAGNOSIS — I471 Supraventricular tachycardia: Secondary | ICD-10-CM | POA: Diagnosis not present

## 2020-11-25 DIAGNOSIS — L821 Other seborrheic keratosis: Secondary | ICD-10-CM | POA: Diagnosis not present

## 2020-11-25 DIAGNOSIS — L570 Actinic keratosis: Secondary | ICD-10-CM | POA: Diagnosis not present

## 2020-11-25 DIAGNOSIS — D225 Melanocytic nevi of trunk: Secondary | ICD-10-CM | POA: Diagnosis not present

## 2020-11-25 DIAGNOSIS — L2089 Other atopic dermatitis: Secondary | ICD-10-CM | POA: Diagnosis not present

## 2020-11-25 DIAGNOSIS — L578 Other skin changes due to chronic exposure to nonionizing radiation: Secondary | ICD-10-CM | POA: Diagnosis not present

## 2020-11-25 DIAGNOSIS — Z85820 Personal history of malignant melanoma of skin: Secondary | ICD-10-CM | POA: Diagnosis not present

## 2020-11-27 ENCOUNTER — Other Ambulatory Visit: Payer: Self-pay | Admitting: General Surgery

## 2020-11-27 DIAGNOSIS — Z77090 Contact with and (suspected) exposure to asbestos: Secondary | ICD-10-CM

## 2020-12-20 ENCOUNTER — Ambulatory Visit
Admission: RE | Admit: 2020-12-20 | Discharge: 2020-12-20 | Disposition: A | Discharge status: Home / Self Care | Payer: Medicare Other | Source: Ambulatory Visit | Attending: General Surgery | Admitting: General Surgery

## 2020-12-20 DIAGNOSIS — Z85820 Personal history of malignant melanoma of skin: Secondary | ICD-10-CM | POA: Diagnosis not present

## 2020-12-20 DIAGNOSIS — Z77090 Contact with and (suspected) exposure to asbestos: Secondary | ICD-10-CM

## 2020-12-20 DIAGNOSIS — K449 Diaphragmatic hernia without obstruction or gangrene: Secondary | ICD-10-CM | POA: Diagnosis not present

## 2020-12-20 DIAGNOSIS — I2510 Atherosclerotic heart disease of native coronary artery without angina pectoris: Secondary | ICD-10-CM | POA: Diagnosis not present

## 2020-12-20 MED ORDER — IOPAMIDOL (ISOVUE-300) INJECTION 61%
75.0000 mL | Freq: Once | INTRAVENOUS | Status: AC | PRN
  Administered 2020-12-20: 75 mL via INTRAVENOUS

## 2021-01-10 DIAGNOSIS — H524 Presbyopia: Secondary | ICD-10-CM | POA: Diagnosis not present

## 2021-01-10 DIAGNOSIS — H5213 Myopia, bilateral: Secondary | ICD-10-CM | POA: Diagnosis not present

## 2021-01-10 DIAGNOSIS — H52203 Unspecified astigmatism, bilateral: Secondary | ICD-10-CM | POA: Diagnosis not present

## 2021-01-10 DIAGNOSIS — Z961 Presence of intraocular lens: Secondary | ICD-10-CM | POA: Diagnosis not present

## 2021-01-13 DIAGNOSIS — C434 Malignant melanoma of scalp and neck: Secondary | ICD-10-CM | POA: Diagnosis not present

## 2021-01-13 DIAGNOSIS — Z77090 Contact with and (suspected) exposure to asbestos: Secondary | ICD-10-CM | POA: Diagnosis not present

## 2021-01-29 ENCOUNTER — Encounter: Payer: Self-pay | Admitting: Family

## 2021-01-29 ENCOUNTER — Ambulatory Visit (INDEPENDENT_AMBULATORY_CARE_PROVIDER_SITE_OTHER): Payer: Medicare Other | Admitting: Family

## 2021-01-29 ENCOUNTER — Telehealth: Payer: Self-pay | Admitting: Family

## 2021-01-29 ENCOUNTER — Other Ambulatory Visit: Payer: Self-pay

## 2021-01-29 VITALS — BP 142/70 | HR 84 | Temp 98.0°F | Resp 17 | Ht 70.5 in | Wt 185.0 lb

## 2021-01-29 DIAGNOSIS — K219 Gastro-esophageal reflux disease without esophagitis: Secondary | ICD-10-CM | POA: Diagnosis not present

## 2021-01-29 DIAGNOSIS — C434 Malignant melanoma of scalp and neck: Secondary | ICD-10-CM

## 2021-01-29 DIAGNOSIS — D509 Iron deficiency anemia, unspecified: Secondary | ICD-10-CM | POA: Diagnosis not present

## 2021-01-29 DIAGNOSIS — J61 Pneumoconiosis due to asbestos and other mineral fibers: Secondary | ICD-10-CM

## 2021-01-29 LAB — CBC WITH DIFFERENTIAL/PLATELET
Basophils Absolute: 0 10*3/uL (ref 0.0–0.1)
Basophils Relative: 0.7 % (ref 0.0–3.0)
Eosinophils Absolute: 0.3 10*3/uL (ref 0.0–0.7)
Eosinophils Relative: 5.4 % — ABNORMAL HIGH (ref 0.0–5.0)
HCT: 38.8 % — ABNORMAL LOW (ref 39.0–52.0)
Hemoglobin: 13.2 g/dL (ref 13.0–17.0)
Lymphocytes Relative: 27.1 % (ref 12.0–46.0)
Lymphs Abs: 1.3 10*3/uL (ref 0.7–4.0)
MCHC: 34 g/dL (ref 30.0–36.0)
MCV: 96.9 fl (ref 78.0–100.0)
Monocytes Absolute: 0.5 10*3/uL (ref 0.1–1.0)
Monocytes Relative: 10.3 % (ref 3.0–12.0)
Neutro Abs: 2.7 10*3/uL (ref 1.4–7.7)
Neutrophils Relative %: 56.5 % (ref 43.0–77.0)
Platelets: 201 10*3/uL (ref 150.0–400.0)
RBC: 4.01 Mil/uL — ABNORMAL LOW (ref 4.22–5.81)
RDW: 15.3 % (ref 11.5–15.5)
WBC: 4.8 10*3/uL (ref 4.0–10.5)

## 2021-01-29 LAB — IRON: Iron: 108 ug/dL (ref 42–165)

## 2021-01-29 LAB — FERRITIN: Ferritin: 68.4 ng/mL (ref 22.0–322.0)

## 2021-01-29 NOTE — Telephone Encounter (Signed)
Can you please call [facility] and request the dates of pt's Shingrix vaccines.

## 2021-01-29 NOTE — Progress Notes (Signed)
Subjective:    Patient ID: Miguel Thomas, male    DOB: 02/27/1941, 80 y.o.   MRN: [id number]  HPI  Patient is an 80 yr old male who presents today for follow up.  Melanoma of the scalp- s/p excision. He continues to follow with dermatology and surgery (Dr. Bradd Burner).  Surgery obtained a CT of the chest which noted the following:   IMPRESSION: 1. Scattered calcified pleural plaques bilaterally, compatible with asbestos related pleural disease. No pleural effusions. 2. Spectrum of findings compatible with basilar predominant fibrotic interstitial lung disease with mild honeycombing. Findings are compatible with UIP pattern due to asbestosis. Findings are consistent with UIP per consensus guidelines: Diagnosis of Idiopathic Pulmonary Fibrosis: An Official ATS/ERS/JRS/ALAT Clinical Practice Guideline. [place], Iss 5, [phone], Jul 03 2017. 3. Two vessel coronary atherosclerosis. 4. Small hiatal hernia. 5. Stable left adrenal adenoma. 6. Aortic Atherosclerosis (ICD10-I70.0).   GERD- on omeprazole 40mg . He denies any gerd symptoms.   Iron deficiency anemia-He is not currently on a supplement.    Reports that he did have the covid booster.     Review of Systems    see HPI  Past Medical History:  Diagnosis Date  . Anemia   . Cancer ([place]) 10/2019   melanoma on scalp  . Dyspnea    had positive covid test 11-04-19  . GERD (gastroesophageal reflux disease) 12-12-04   ring like esophageal stricture with reflux esophagitis, also in 2011  . Hiatal hernia   . History of adenomatous polyp of colon    tubular adenom's in 2006;  2011; 03-05-2015  . History of esophageal stricture    s/p  dilatation 2006  and 2012  . Osteopenia   . Penile ulcer   . Plantar fasciitis   . Prostate nodule   . Pseudophakia of both eyes   . Wears hearing aid in both ears      Social History   Socioeconomic History  . Marital status: Widowed    Spouse name: Not on file  .  Number of children: 1  . Years of education: Not on file  . Highest education level: Not on file  Occupational History  . Occupation: retired  Tobacco Use  . Smoking status: Never Smoker  . Smokeless tobacco: Never Used  Substance and Sexual Activity  . Alcohol use: No    Alcohol/week: 0.0 standard drinks  . Drug use: No  . Sexual activity: Not Currently  Other Topics Concern  . Not on file  Social History Narrative   Retired   Married (2nd marriage)   Daily Caffeine- use up to [employer] daily         Social Determinants of Health   Financial Resource Strain: Low Risk   . Difficulty of Paying Living Expenses: Not hard at all  Food Insecurity: No Food Insecurity  . Worried About Charity fundraiser in the Last Year: Never true  . Ran Out of Food in the Last Year: Never true  Transportation Needs: No Transportation Needs  . Lack of Transportation (Medical): No  . Lack of Transportation (Non-Medical): No  Physical Activity: Not on file  Stress: Not on file  Social Connections: Not on file  Intimate Partner Violence: Not on file    Past Surgical History:  Procedure Laterality Date  . APPLICATION OF A-CELL OF EXTREMITY N/A 12/05/2019   Procedure: APPLICATION OF A-CELL;  Surgeon: Stark Klein, MD;  Location: [place];  Service: General;  Laterality: N/A;  . CARDIOVASCULAR STRESS TEST  06/11/2011   normal nuclear study w/ no ischemia/  normal LV function and wall motion, ef 58%  . CATARACT EXTRACTION W/ INTRAOCULAR LENS  IMPLANT, BILATERAL  2008 approx.  . CHOLECYSTECTOMY N/A 02/27/2020   Procedure: LAPAROSCOPIC CHOLECYSTECTOMY;  Surgeon: [name], MD;  [facility];  Service: General;  Laterality: N/A;  . COLONOSCOPY  last one 03-05-2015  . ERCP N/A 02/26/2020   Procedure: ENDOSCOPIC RETROGRADE CHOLANGIOPANCREATOGRAPHY (ERCP);  Surgeon: Irene Shipper, MD;  Location: [facility];  Service: Endoscopy;  Laterality: N/A;  . ESOPHAGOGASTRODUODENOSCOPY  last one  01-06-2011  . INGUINAL HERNIA REPAIR Right 1990's  . MELANOMA EXCISION N/A 12/05/2019   Procedure: WIDE LOCAL EXCISION MELANOMA EXCISION OF SCALP;  Surgeon: Stark Klein, MD;  Location: [place];  Service: General;  Laterality: N/A;  . PENILE BIOPSY N/A 03/12/2017   Procedure: EXCISIONAL GLANS PENILE BIOPSY;  Surgeon: Festus Aloe, MD;  Location: [facility];  Service: Urology;  Laterality: N/A;  . PROSTATE BIOPSY  11/2011   normal  . REMOVAL OF STONES  02/26/2020   Procedure: REMOVAL OF STONES;  Surgeon: Irene Shipper, MD;  [facility];  Service: Endoscopy;;  . SKIN FULL THICKNESS GRAFT Left 12/05/2019   Procedure: SKIN GRAFT FULL THICKNESS FROM LEFT UPPER CHEST TO SCALP;  Surgeon: Stark Klein, MD;  Location: [place];  Service: General;  Laterality: Left;  . SPHINCTEROTOMY  02/26/2020   Procedure: SPHINCTEROTOMY;  Surgeon: Irene Shipper, MD;  Location: [facility];  Service: Endoscopy;;  . TONSILLECTOMY  child    Family History  Problem Relation Age of Onset  . Coronary artery disease Other   . Heart disease Other        rheumatic  . Colon cancer Neg Hx   . Esophageal cancer Neg Hx   . Rectal cancer Neg Hx   . Stomach cancer Neg Hx     No Known Allergies  Current Outpatient Medications on File Prior to Visit  Medication Sig Dispense Refill  . omeprazole (PRILOSEC) 40 MG capsule TAKE 1 CAPSULE BY MOUTH ONCE DAILY BEFORE BREAKFAST 30 capsule 11   No current facility-administered medications on file prior to visit.    BP (!) 142/70 (BP Location: Right Arm, Patient Position: Sitting, Cuff Size: Normal)   Pulse 84   Temp 98 F (36.7 C)   Resp 17   Ht 5' 10.5" (1.791 m)   Wt 185 lb (83.9 kg)   SpO2 98%   BMI 26.17 kg/m    Objective:   Physical Exam Constitutional:      General: He is not in acute distress.    Appearance: He is well-developed.  HENT:     Head: Normocephalic and atraumatic.   Cardiovascular:     Rate and Rhythm: Normal rate and regular rhythm.     Heart sounds: No murmur heard.   Pulmonary:     Effort: Pulmonary effort is normal. No respiratory distress.     Breath sounds: Normal breath sounds. No wheezing or rales.  Skin:    General: Skin is warm and dry.  Neurological:     Mental Status: He is alert and oriented to person, place, and time.  Psychiatric:        Behavior: Behavior normal.        Thought Content: Thought content normal.           Assessment & Plan:  Asbestosis- he believes that he was exposed when he was in the [employer].  Due to this finding as well as pulmonary fibrosis will refer to pulmonology for consultation.  GERD- stable on omeprazole.  Iron deficiency anemia- check follow up iron, ferritin, cbc.  Melanoma- stable, management per dermatology/general surgery.   This visit occurred during the SARS-CoV-2 public health emergency.  Safety protocols were in place, including screening questions prior to the visit, additional usage of staff PPE, and extensive cleaning of exam room while observing appropriate contact time as indicated for disinfecting solutions.

## 2021-01-29 NOTE — Patient Instructions (Signed)
Please complete lab work prior to leaving. Call us with the date of your third covid vaccine.

## 2021-01-29 NOTE — Progress Notes (Signed)
Mailed out to pt 

## 2021-01-30 ENCOUNTER — Telehealth: Payer: Self-pay | Admitting: *Deleted

## 2021-01-30 NOTE — Telephone Encounter (Signed)
Patient just wanted to let us know the dates of his 3 covid vaccine.  02/10/20, 03/11/20, 10/05/20

## 2021-01-30 NOTE — Telephone Encounter (Signed)
Patient Name Ariez Neilan Patient DOB [birth year] Call Type Message Only Information Provided Reason for Call Request for General Office Information Initial Comment Caller's wanting to speak with Kennyth Lose. He had his first covid. Additional Comment Would like a call back. Provided hours. Disp. Time Disposition Final User 01/30/2021 7:48:05 AM General Information Provided Yes Francee Gentile

## 2021-01-30 NOTE — Telephone Encounter (Signed)
Only found one in [facility] give in 12/2020. Will call [facility] to verify if he had two. -Jma

## 2021-02-04 NOTE — Telephone Encounter (Signed)
Were you able to confirm with [facility]? Thanks

## 2021-02-05 NOTE — Telephone Encounter (Signed)
Yes, 1st one he  received on 08/21/20. I documented it in his chart. - Jma

## 2021-02-18 ENCOUNTER — Other Ambulatory Visit: Payer: Self-pay

## 2021-02-18 ENCOUNTER — Other Ambulatory Visit: Payer: Self-pay | Admitting: Internal Medicine

## 2021-02-18 MED ORDER — OMEPRAZOLE 40 MG PO CPDR: DELAYED_RELEASE_CAPSULE | ORAL | 2 refills | Status: DC

## 2021-02-22 ENCOUNTER — Other Ambulatory Visit: Payer: Self-pay | Admitting: Internal Medicine

## 2021-02-24 ENCOUNTER — Telehealth: Payer: Self-pay | Admitting: Internal Medicine

## 2021-02-24 MED ORDER — OMEPRAZOLE 40 MG PO CPDR: DELAYED_RELEASE_CAPSULE | ORAL | 5 refills | Status: DC

## 2021-02-24 NOTE — Telephone Encounter (Signed)
I refilled his omeprazole as requested. Up to date on his visits.

## 2021-02-24 NOTE — Telephone Encounter (Signed)
Patient called to request refill on Omeprazole.

## 2021-05-01 ENCOUNTER — Encounter: Payer: Self-pay | Admitting: Pulmonary Disease

## 2021-05-01 ENCOUNTER — Other Ambulatory Visit: Payer: Self-pay

## 2021-05-01 ENCOUNTER — Ambulatory Visit: Payer: Medicare Other | Admitting: Pulmonary Disease

## 2021-05-01 VITALS — BP 124/70 | HR 80 | Ht 71.0 in | Wt 188.6 lb

## 2021-05-01 DIAGNOSIS — J61 Pneumoconiosis due to asbestos and other mineral fibers: Secondary | ICD-10-CM

## 2021-05-01 NOTE — Progress Notes (Signed)
Synopsis: Referred in June 2022 for Asbestosis by [name], NP  Subjective:   PATIENT ID: Miguel Thomas GENDER: male DOB: [birth year], MRN: [id number]   HPI  Chief Complaint  Patient presents with   Consult    Asbestosis.   Miguel Thomas is an 80 year old male, never smoker with GERD and hiatal hernia who is referred to pulmonary clinic for concern of asbestosis.   He had a CT Chest scan done 12/2020 which showed scattered calcified pleural plaques bilaterally. No pleural effusions. Moderate patchy confluent subpleural reticulation and ground-glass attenuation throughout both lungs with associated mild traction bronchiectasis and architectural distortion.   He was in the [employer] for 8 years. He worked as a Chiropodist. He is not aware of any overt asbestos exposure. He tested positive for covid 19 11/2020 and reports having a mild case quarantining at home.   He denies any dyspnea, cough or wheezing. He continues to take care of the yard work at his home and denies any limitations to his day to day activities.   Past Medical History:  Diagnosis Date   Anemia    Cancer ([place]) 10/2019   melanoma on scalp   Dyspnea    had positive covid test 11-04-19   GERD (gastroesophageal reflux disease) 12-12-04   ring like esophageal stricture with reflux esophagitis, also in 2011   Hiatal hernia    History of adenomatous polyp of colon    tubular adenom's in 2006;  2011; 03-05-2015   History of esophageal stricture    s/p  dilatation 2006  and 2012   Osteopenia    Penile ulcer    Plantar fasciitis    Prostate nodule    Pseudophakia of both eyes    Wears hearing aid in both ears      Family History  Problem Relation Age of Onset   Coronary artery disease Other    Heart disease Other        rheumatic   Colon cancer Neg Hx    Esophageal cancer Neg Hx    Rectal cancer Neg Hx    Stomach cancer Neg Hx      Social History   Socioeconomic History   Marital status:  Widowed    Spouse name: Not on file   Number of children: 1   Years of education: Not on file   Highest education level: Not on file  Occupational History   Occupation: retired  Tobacco Use   Smoking status: Never   Smokeless tobacco: Never  Substance and Sexual Activity   Alcohol use: No    Alcohol/week: 0.0 standard drinks   Drug use: No   Sexual activity: Not Currently  Other Topics Concern   Not on file  Social History Narrative   Retired   Married (2nd marriage)   Daily Caffeine- use up to [employer] daily         Social Determinants of Radio broadcast assistant Strain: Not on file  Food Insecurity: Not on file  Transportation Needs: Not on file  Physical Activity: Not on file  Stress: Not on file  Social Connections: Not on file  Intimate Partner Violence: Not on file     No Known Allergies   Outpatient Medications Prior to Visit  Medication Sig Dispense Refill   omeprazole (PRILOSEC) 40 MG capsule TAKE 1 CAPSULE BY MOUTH ONCE DAILY BEFORE BREAKFAST 30 capsule 5   No facility-administered medications prior to visit.    Review  of Systems  Constitutional:  Negative for chills, fever, malaise/fatigue and weight loss.  HENT:  Positive for hearing loss. Negative for congestion, sinus pain and sore throat.   Eyes: Negative.   Respiratory:  Negative for cough, hemoptysis, sputum production, shortness of breath and wheezing.   Cardiovascular:  Negative for chest pain, palpitations, orthopnea, claudication and leg swelling.  Gastrointestinal:  Negative for abdominal pain, heartburn, nausea and vomiting.  Genitourinary: Negative.   Musculoskeletal:  Negative for joint pain and myalgias.  Skin:  Negative for rash.  Neurological:  Negative for weakness.  Endo/Heme/Allergies: Negative.   Psychiatric/Behavioral: Negative.     Objective:   Vitals:   05/01/21 1601  BP: 124/70  Pulse: 80  SpO2: 98%  Weight: 188 lb 9.6 oz (85.5 kg)  Height: 5\' 11"  (1.803 m)    Physical Exam Constitutional:      General: He is not in acute distress. HENT:     Head: Normocephalic and atraumatic.  Eyes:     Extraocular Movements: Extraocular movements intact.     Conjunctiva/sclera: Conjunctivae normal.     Pupils: Pupils are equal, round, and reactive to light.  Cardiovascular:     Rate and Rhythm: Normal rate and regular rhythm.     Pulses: Normal pulses.     Heart sounds: Normal heart sounds. No murmur heard. Pulmonary:     Breath sounds: Rales (scattered at bilateral bases) present.  Abdominal:     General: Bowel sounds are normal.     Palpations: Abdomen is soft.  Musculoskeletal:     Right lower leg: No edema.     Left lower leg: No edema.  Lymphadenopathy:     Cervical: No cervical adenopathy.  Skin:    General: Skin is warm and dry.  Neurological:     General: No focal deficit present.     Mental Status: He is alert.  Psychiatric:        Mood and Affect: Mood normal.        Behavior: Behavior normal.        Thought Content: Thought content normal.        Judgment: Judgment normal.    CBC    Component Value Date/Time   WBC 4.8 01/29/2021 0735   RBC 4.01 (L) 01/29/2021 0735   HGB 13.2 01/29/2021 0735   HCT 38.8 (L) 01/29/2021 0735   PLT 201.0 01/29/2021 0735   MCV 96.9 01/29/2021 0735   MCH 33.2 (H) 07/31/2020 0738   MCHC 34.0 01/29/2021 0735   RDW 15.3 01/29/2021 0735   LYMPHSABS 1.3 01/29/2021 0735   MONOABS 0.5 01/29/2021 0735   EOSABS 0.3 01/29/2021 0735   BASOSABS 0.0 01/29/2021 0735   BMP Latest Ref Rng & Units 03/06/2020 02/29/2020 02/28/2020  Glucose 70 - 99 mg/dL 93 77 130(H)  BUN 6 - 23 mg/dL 16 18 14   Creatinine 0.40 - 1.50 mg/dL 0.76 0.76 0.70  Sodium 135 - 145 mEq/L 137 137 139  Potassium 3.5 - 5.1 mEq/L 4.0 3.3(L) 3.8  Chloride 96 - 112 mEq/L 103 105 108  CO2 19 - 32 mEq/L 29 25 25   Calcium 8.4 - 10.5 mg/dL 8.2(L) 7.7(L) 8.1(L)   Chest imaging: CT Chest w contrast 12/20/20 Mediastinum/Nodes: No discrete  thyroid nodules. Unremarkable esophagus. No pathologically enlarged axillary, mediastinal or hilar lymph nodes.   Lungs/Pleura: No pneumothorax. A few scattered calcified pleural plaques are noted bilaterally anteriorly, posteriorly and along the hemidiaphragms. No pleural effusions. No acute consolidative airspace disease, lung masses or  significant pulmonary nodules. Moderate patchy confluent subpleural reticulation and ground-glass attenuation throughout both lungs with associated mild traction bronchiectasis and architectural distortion. No clear apicobasilar gradient to these findings. Mild honeycombing scattered in dependent lower lobes bilaterally.  Barium Swallow 02/2020 1. Severe gastroesophageal reflux. 2. Small reducible hiatal hernia. 3. No evidence of esophageal stricture or ulceration.  PFT: No flowsheet data found.   Assessment & Plan:   Pulmonary asbestosis ([place]) - Plan: Pulmonary Function Test  Discussion: Miguel Thomas is an 80 year old male, never smoker with GERD and hiatal hernia who is referred to pulmonary clinic for concern of asbestosis.   Patient has evidence of pulmonary fibrosis and pleural plaques on CT chest scan from 12/2020 which is indicative of pulmonary asbestosis.   We discussed potential treatment options with antifibrotic medications but I did not recommend this at this time due to the side effect profile creating more risks than benefits.  The patient continues to remain active on a daily basis without any limitations from a respiratory standpoint.  We will monitor his respiratory status and we will have him follow-up in 6 months with pulmonary function testing.  Freda Jackson, MD [facility] Office: [phone]   Current Outpatient Medications:    omeprazole (PRILOSEC) 40 MG capsule, TAKE 1 CAPSULE BY MOUTH ONCE DAILY BEFORE BREAKFAST, Disp: 30 capsule, Rfl: 5

## 2021-05-01 NOTE — Patient Instructions (Addendum)
You have pulmonary fibrosis due to asbestos exposure in the past.   We will have you follow up in 6 months to monitor your breathing with pulmonary function tests  If you develop worsening breathing symptoms or cough please call us.

## 2021-05-26 DIAGNOSIS — L578 Other skin changes due to chronic exposure to nonionizing radiation: Secondary | ICD-10-CM | POA: Diagnosis not present

## 2021-05-26 DIAGNOSIS — L570 Actinic keratosis: Secondary | ICD-10-CM | POA: Diagnosis not present

## 2021-05-26 DIAGNOSIS — Z859 Personal history of malignant neoplasm, unspecified: Secondary | ICD-10-CM | POA: Diagnosis not present

## 2021-05-26 DIAGNOSIS — D692 Other nonthrombocytopenic purpura: Secondary | ICD-10-CM | POA: Diagnosis not present

## 2021-05-26 DIAGNOSIS — L821 Other seborrheic keratosis: Secondary | ICD-10-CM | POA: Diagnosis not present

## 2021-05-26 DIAGNOSIS — Z85820 Personal history of malignant melanoma of skin: Secondary | ICD-10-CM | POA: Diagnosis not present

## 2021-05-26 DIAGNOSIS — D045 Carcinoma in situ of skin of trunk: Secondary | ICD-10-CM | POA: Diagnosis not present

## 2021-07-01 ENCOUNTER — Other Ambulatory Visit: Payer: Self-pay

## 2021-07-01 ENCOUNTER — Ambulatory Visit (INDEPENDENT_AMBULATORY_CARE_PROVIDER_SITE_OTHER): Payer: Medicare Other

## 2021-07-01 VITALS — BP 144/80 | HR 76 | Temp 98.7°F | Resp 16 | Ht 70.5 in | Wt 186.0 lb

## 2021-07-01 DIAGNOSIS — Z0000 Encounter for general adult medical examination without abnormal findings: Secondary | ICD-10-CM | POA: Diagnosis not present

## 2021-07-01 NOTE — Progress Notes (Signed)
Subjective:   Miguel Thomas is a 80 y.o. male who presents for Medicare Annual/Subsequent preventive examination.  Review of Systems     Cardiac Risk Factors include: advanced age (>72mn, >>75women);male gender     Objective:    Today's Vitals   07/01/21 0811  BP: (!) 144/80  Pulse: 76  Resp: 16  Temp: 98.7 F (37.1 C)  TempSrc: Temporal  SpO2: 96%  Weight: 186 lb (84.4 kg)  Height: 5' 10.5" (1.791 m)   Body mass index is 26.31 kg/m.  Advanced Directives 07/01/2021 03/05/2020 02/24/2020 02/24/2020 12/05/2019 11/29/2019 04/14/2018  Does Patient Have a Medical Advance Directive? No No No No No No No  Would patient like information on creating a medical advance directive? No - Patient declined No - Patient declined No - Patient declined No - Patient declined No - Patient declined No - Patient declined Yes (MAU/Ambulatory/Procedural Areas - Information given)    Current Medications (verified) Outpatient Encounter Medications as of 07/01/2021  Medication Sig   omeprazole (PRILOSEC) 40 MG capsule TAKE 1 CAPSULE BY MOUTH ONCE DAILY BEFORE BREAKFAST   No facility-administered encounter medications on file as of 07/01/2021.    Allergies (verified) Patient has no known allergies.   History: Past Medical History:  Diagnosis Date   Anemia    [place] 10[place]   melanoma on scalp   Dyspnea    had positive covid test 11-04-19   GERD (gastroesophageal reflux disease) 12-12-04   ring like esophageal stricture with reflux esophagitis, also in 2011   Hiatal hernia    History of adenomatous polyp of colon    tubular adenom's in 2006;  2011; 03-05-2015   History of esophageal stricture    s/p  dilatation 2006  and 2012   Osteopenia    Penile ulcer    Plantar fasciitis    Prostate nodule    Pseudophakia of both eyes    Wears hearing aid in both ears    Past Surgical History:  Procedure Laterality Date   APPLICATION OF A-CELL OF EXTREMITY N/A 12/05/2019   Procedure: APPLICATION OF  A-CELL;  Surgeon: BStark Klein MD;  [facility]  Service: General;  Laterality: N/A;   CARDIOVASCULAR STRESS TEST  06/11/2011   normal nuclear study w/ no ischemia/  normal LV function and wall motion, ef 58%   CATARACT EXTRACTION W/ INTRAOCULAR LENS  IMPLANT, BILATERAL  2008 approx.   CHOLECYSTECTOMY N/A 02/27/2020   Procedure: LAPAROSCOPIC CHOLECYSTECTOMY;  Surgeon: GArmandina Gemma MD;  Location: [facility];  Service: General;  Laterality: N/A;   COLONOSCOPY  last one 03-05-2015   ERCP N/A 02/26/2020   Procedure: ENDOSCOPIC RETROGRADE CHOLANGIOPANCREATOGRAPHY (ERCP);  Surgeon: PIrene Shipper MD;  [facility];  Service: Endoscopy;  Laterality: N/A;   ESOPHAGOGASTRODUODENOSCOPY  last one 01-06-2011   INGUINAL HERNIA REPAIR Right 1990's   MELANOMA EXCISION N/A 12/05/2019   Procedure: WIDE LOCAL EXCISION MELANOMA EXCISION OF SCALP;  Surgeon: BStark Klein MD;  [facility]  Service: General;  Laterality: N/A;   PENILE BIOPSY N/A 03/12/2017   Procedure: EXCISIONAL GLANS PENILE BIOPSY;  Surgeon: EFestus Aloe MD;  [facility]  Service: Urology;  Laterality: N/A;   PROSTATE BIOPSY  11/2011   normal   REMOVAL OF STONES  02/26/2020   Procedure: REMOVAL OF STONES;  Surgeon: PIrene Shipper MD;  [facility];  Service: Endoscopy;;   SKIN FULL THICKNESS GRAFT Left 12/05/2019   Procedure:  SKIN GRAFT FULL THICKNESS FROM LEFT UPPER CHEST TO SCALP;  Surgeon: Stark Klein, MD;  Location: [facility];  Service: General;  Laterality: Left;   SPHINCTEROTOMY  02/26/2020   Procedure: SPHINCTEROTOMY;  Surgeon: Irene Shipper, MD;  [facility]: [facility];  Service: Endoscopy;;   TONSILLECTOMY  child   Family History  Problem Relation Age of Onset   Coronary artery disease Other    Heart disease Other        rheumatic   Colon cancer Neg Hx    Esophageal cancer Neg Hx    Rectal cancer Neg Hx    Stomach  cancer Neg Hx    Social History   Socioeconomic History   Marital status: Widowed    Spouse name: Not on file   Number of children: 1   Years of education: Not on file   Highest education level: Not on file  Occupational History   Occupation: retired  Tobacco Use   Smoking status: Never   Smokeless tobacco: Never  Substance and Sexual Activity   Alcohol use: No    Alcohol/week: 0.0 standard drinks   Drug use: No   Sexual activity: Not Currently  Other Topics Concern   Not on file  Social History Narrative   Retired   Married (2nd marriage)   Daily Caffeine- use up to [employer] daily         Social Determinants of Radio broadcast assistant Strain: Low Risk    Difficulty of Paying Living Expenses: Not hard at all  Food Insecurity: No Food Insecurity   Worried About Charity fundraiser in the Last Year: Never true   Arboriculturist in the Last Year: Never true  Transportation Needs: No Transportation Needs   Lack of Transportation (Medical): No   Lack of Transportation (Non-Medical): No  Physical Activity: Inactive   Days of Exercise per Week: 0 days   Minutes of Exercise per Session: 0 min  Stress: No Stress Concern Present   Feeling of Stress : Not at all  Social Connections: Moderately Isolated   Frequency of Communication with Friends and Family: More than three times a week   Frequency of Social Gatherings with Friends and Family: More than three times a week   Attends Religious Services: More than 4 times per year   Active Member of [employer] or Organizations: No   Attends Archivist Meetings: Never   Marital Status: Widowed    Tobacco Counseling Counseling given: Not Answered   Clinical Intake:  Pre-visit preparation completed: Yes  Pain : No/denies pain     Nutritional Status: BMI 25 -29 Overweight Nutritional Risks: None Diabetes: No  How often do you need to have someone help you when you read instructions, pamphlets, or other written  materials from your doctor or pharmacy?: 1 - Never  Diabetic?No  Interpreter Needed?: No  Information entered by :: Caroleen Hamman LPN   Activities of Daily Living In your present state of health, do you have any difficulty performing the following activities: 07/01/2021  Hearing? N  Vision? N  Difficulty concentrating or making decisions? N  Walking or climbing stairs? N  Dressing or bathing? N  Doing errands, shopping? N  Preparing Food and eating ? N  Using the Toilet? N  In the past six months, have you accidently leaked urine? N  Do you have problems with loss of bowel control? N  Managing your Medications? N  Managing your Finances? N  Housekeeping or managing your Housekeeping? N  Some recent data might be hidden    Patient Care Team: [name], NP as PCP - General (Internal Medicine) Gatha Mayer, MD as Consulting Physician (Gastroenterology) Christy Sartorius, MD as Referring Physician (Urology) Stark Klein, MD as Consulting Physician (General Surgery) Druscilla Brownie, MD as Referring Physician (Dermatology) [name], MD as Consulting Physician (General Surgery) [place], [place] any recent Medical Services you may have received from other than [facility] providers in the past year (date may be approximate).     Assessment:   This is a routine wellness examination for [name].  Hearing/Vision screen Hearing Screening - Comments:: Patient had a hearing test a few weeks ago &amp; is in the process of getting hearing aids Vision Screening - Comments:: Last eye exam-12/2020-Dr. Gershon Crane  Dietary issues and exercise activities discussed: Current Exercise Habits: The patient does not participate in regular exercise at present, Exercise limited by: None identified   Goals Addressed             This Visit's Progress    Patient Stated       Maintain current health       Depression Screen PHQ 2/9 Scores 07/01/2021 03/05/2020 10/13/2019  04/14/2018 04/20/2017 01/24/2016 02/01/2015  PHQ - 2 Score 0 0 0 0 1 0 0  PHQ- 9 Score - - - - 6 - -    Fall Risk Fall Risk  07/01/2021 03/05/2020 10/13/2019 06/01/2019 04/14/2018  Falls in the past year? 0 0 0 (No Data) Yes  Comment - - - Emmi Telephone Survey: data to providers prior to load -  Number falls in past yr: 0 0 0 (No Data) 1  Comment - - - Emmi Telephone Survey Actual Response =  -  Injury with Fall? 0 0 0 - No  Follow up Falls prevention discussed Education provided;Falls prevention discussed Falls evaluation completed - Education provided;Falls prevention discussed    FALL RISK PREVENTION PERTAINING TO THE HOME:  Any stairs in or around the home? No  Home free of loose throw rugs in walkways, pet beds, electrical cords, etc? Yes  Adequate lighting in your home to reduce risk of falls? Yes   ASSISTIVE DEVICES UTILIZED TO PREVENT FALLS:  Life alert? No  Use of a cane, walker or w/c? No  Grab bars in the bathroom? No  Shower chair or bench in shower? No  Elevated toilet seat or a handicapped toilet? No   TIMED UP AND GO:  Was the test performed? Yes .  Length of time to ambulate 10 feet: 11 sec.   Gait steady and fast without use of assistive device  Cognitive Function: MMSE - Mini Mental State Exam 04/14/2018  Orientation to time 5  Orientation to Place 5  Registration 3  Attention/ Calculation 5  Recall 1  Language- name 2 objects 2  Language- repeat 1  Language- follow 3 step command 3  Language- read & follow direction 1  Write a sentence 1  Copy design 1  Total score 28        Immunizations Immunization History  Administered Date(s) Administered   Fluad Quad(high Dose 65+) 07/31/2020   Influenza Whole 08/22/2007, 07/13/2009, 07/19/2010   Influenza, High Dose Seasonal PF 07/23/2015, 08/11/2016, 08/19/2018   Influenza-Unspecified 08/02/2014, 08/03/2019   PFIZER(Purple Top)SARS-COV-2 Vaccination 02/10/2020, 03/11/2020, 10/05/2020   Pneumococcal  Conjugate-13 01/22/2015   Pneumococcal Polysaccharide-23 02/14/2007   Td 05/19/2004, 01/22/2015   Zoster Recombinat (Shingrix) 08/21/2020, 12/09/2020  Zoster, Live 11/02/2009    TDAP status: Up to date  Flu Vaccine status: Up to date  Pneumococcal vaccine status: Up to date  Covid-19 vaccine status: Information provided on how to obtain vaccines. 2nd booster due  Qualifies for Shingles Vaccine? No   Zostavax completed Yes   Shingrix Completed?: Yes  Screening Tests Health Maintenance  Topic Date Due   COVID-19 Vaccine (4 - Booster for Pfizer series) 01/03/2021   INFLUENZA VACCINE  06/02/2021   TETANUS/TDAP  01/21/2025   PNA vac Low Risk Adult  Completed   Zoster Vaccines- Shingrix  Completed   HPV VACCINES  Aged Out    Health Maintenance  Health Maintenance Due  Topic Date Due   COVID-19 Vaccine (4 - Booster for Pfizer series) 01/03/2021   INFLUENZA VACCINE  06/02/2021    Colorectal cancer screening: No longer required.   Lung Cancer Screening: (Low Dose CT Chest recommended if Age 55-80 years, 30 pack-year currently smoking OR have quit w/in 15years.) does not qualify.     Additional Screening:  Hepatitis C Screening: does not qualify  Vision Screening: Recommended annual ophthalmology exams for early detection of glaucoma and other disorders of the eye. Is the patient up to date with their annual eye exam?  Yes  Who is the provider or what is the name of the office in which the patient attends annual eye exams? Dr. Gershon Crane    Dental Screening: Recommended annual dental exams for proper oral hygiene  Community Resource Referral / Chronic Care Management: CRR required this visit?  No   CCM required this visit?  No      Plan:     I have personally reviewed and noted the following in the patient's chart:   Medical and social history Use of alcohol, tobacco or illicit drugs  Current medications and supplements including opioid prescriptions. Patient  is not currently taking opioid prescriptions. Functional ability and status Nutritional status Physical activity Advanced directives List of other physicians Hospitalizations, surgeries, and ER visits in previous 12 months Vitals Screenings to include cognitive, depression, and falls Referrals and appointments  In addition, I have reviewed and discussed with patient certain preventive protocols, quality metrics, and best practice recommendations. A written personalized care plan for preventive services as well as general preventive health recommendations were provided to patient.     Marta Antu, LPN   [id number]  Nurse Health Advisor  Nurse Notes: None

## 2021-07-01 NOTE — Patient Instructions (Signed)
[name] , Thank you for taking time to come for your Medicare Wellness Visit. I appreciate your ongoing commitment to your health goals. Please review the following plan we discussed and let me know if I can assist you in the future.   Screening recommendations/referrals: Colonoscopy: No longer required Recommended yearly ophthalmology/optometry visit for glaucoma screening and checkup Recommended yearly dental visit for hygiene and checkup  Vaccinations: Influenza vaccine: Due 07/2021 Pneumococcal vaccine: Up to date Tdap vaccine: Up to date-Due 01/21/2025 Shingles vaccine: Completed vaccines   Covid-19: Booster due-May obtain vaccine at your local pharmacy  Advanced directives: Information given today  Conditions/risks identified: See problem list  Next appointment: Follow up in one year for your annual wellness visit. 07/08/2022 @ 8:20  Preventive Care 65 Years and Older, Male Preventive care refers to lifestyle choices and visits with your health care provider that can promote health and wellness. What does preventive care include? A yearly physical exam. This is also called an annual well check. Dental exams once or twice a year. Routine eye exams. Ask your health care provider how often you should have your eyes checked. Personal lifestyle choices, including: Daily care of your teeth and gums. Regular physical activity. Eating a healthy diet. Avoiding tobacco and drug use. Limiting alcohol use. Practicing safe sex. Taking low doses of aspirin every day. Taking vitamin and mineral supplements as recommended by your health care provider. What happens during an annual well check? The services and screenings done by your health care provider during your annual well check will depend on your age, overall health, lifestyle risk factors, and family history of disease. Counseling  Your health care provider may ask you questions about your: Alcohol use. Tobacco use. Drug  use. Emotional well-being. Home and relationship well-being. Sexual activity. Eating habits. History of falls. Memory and ability to understand (cognition). Work and work Statistician. Screening  You may have the following tests or measurements: Height, weight, and BMI. Blood pressure. Lipid and cholesterol levels. These may be checked every 5 years, or more frequently if you are over 52 years old. Skin check. Lung cancer screening. You may have this screening every year starting at age 3 if you have a 30-pack-year history of smoking and currently smoke or have quit within the past 15 years. Fecal occult blood test (FOBT) of the stool. You may have this test every year starting at age 62. Flexible sigmoidoscopy or colonoscopy. You may have a sigmoidoscopy every 5 years or a colonoscopy every 10 years starting at age 28. Prostate cancer screening. Recommendations will vary depending on your family history and other risks. Hepatitis C blood test. Hepatitis B blood test. Sexually transmitted disease (STD) testing. Diabetes screening. This is done by checking your blood sugar (glucose) after you have not eaten for a while (fasting). You may have this done every 1-3 years. Abdominal aortic aneurysm (AAA) screening. You may need this if you are a current or former smoker. Osteoporosis. You may be screened starting at age 27 if you are at high risk. Talk with your health care provider about your test results, treatment options, and if necessary, the need for more tests. Vaccines  Your health care provider may recommend certain vaccines, such as: Influenza vaccine. This is recommended every year. Tetanus, diphtheria, and acellular pertussis (Tdap, Td) vaccine. You may need a Td booster every 10 years. Zoster vaccine. You may need this after age 58. Pneumococcal 13-valent conjugate (PCV13) vaccine. One dose is recommended after age 34. Pneumococcal  polysaccharide (PPSV23) vaccine. One dose is  recommended after age 50. Talk to your health care provider about which screenings and vaccines you need and how often you need them. This information is not intended to replace advice given to you by your health care provider. Make sure you discuss any questions you have with your health care provider. Document Released: 11/15/2015 Document Revised: 07/08/2016 Document Reviewed: 08/20/2015 Elsevier Interactive Patient Education  2017 [place] Prevention in the Home Falls can cause injuries. They can happen to people of all ages. There are many things you can do to make your home safe and to help prevent falls. What can I do on the outside of my home? Regularly fix the edges of walkways and driveways and fix any cracks. Remove anything that might make you trip as you walk through a door, such as a raised step or threshold. Trim any bushes or trees on the path to your home. Use bright outdoor lighting. Clear any walking paths of anything that might make someone trip, such as rocks or tools. Regularly check to see if handrails are loose or broken. Make sure that both sides of any steps have handrails. Any raised decks and porches should have guardrails on the edges. Have any leaves, snow, or ice cleared regularly. Use sand or salt on walking paths during winter. Clean up any spills in your garage right away. This includes oil or grease spills. What can I do in the bathroom? Use night lights. Install grab bars by the toilet and in the tub and shower. Do not use towel bars as grab bars. Use non-skid mats or decals in the tub or shower. If you need to sit down in the shower, use a plastic, non-slip stool. Keep the floor dry. Clean up any water that spills on the floor as soon as it happens. Remove soap buildup in the tub or shower regularly. Attach bath mats securely with double-sided non-slip rug tape. Do not have throw rugs and other things on the floor that can make you  trip. What can I do in the bedroom? Use night lights. Make sure that you have a light by your bed that is easy to reach. Do not use any sheets or blankets that are too big for your bed. They should not hang down onto the floor. Have a firm chair that has side arms. You can use this for support while you get dressed. Do not have throw rugs and other things on the floor that can make you trip. What can I do in the kitchen? Clean up any spills right away. Avoid walking on wet floors. Keep items that you use a lot in easy-to-reach places. If you need to reach something above you, use a strong step stool that has a grab bar. Keep electrical cords out of the way. Do not use floor polish or wax that makes floors slippery. If you must use wax, use non-skid floor wax. Do not have throw rugs and other things on the floor that can make you trip. What can I do with my stairs? Do not leave any items on the stairs. Make sure that there are handrails on both sides of the stairs and use them. Fix handrails that are broken or loose. Make sure that handrails are as long as the stairways. Check any carpeting to make sure that it is firmly attached to the stairs. Fix any carpet that is loose or worn. Avoid having throw rugs at the top  or bottom of the stairs. If you do have throw rugs, attach them to the floor with carpet tape. Make sure that you have a light switch at the top of the stairs and the bottom of the stairs. If you do not have them, ask someone to add them for you. What else can I do to help prevent falls? Wear shoes that: Do not have high heels. Have rubber bottoms. Are comfortable and fit you well. Are closed at the toe. Do not wear sandals. If you use a stepladder: Make sure that it is fully opened. Do not climb a closed stepladder. Make sure that both sides of the stepladder are locked into place. Ask someone to hold it for you, if possible. Clearly [place] and make sure that you can  see: Any grab bars or handrails. First and last steps. Where the edge of each step is. Use tools that help you move around (mobility aids) if they are needed. These include: Canes. Walkers. Scooters. Crutches. Turn on the lights when you go into a dark area. Replace any light bulbs as soon as they burn out. Set up your furniture so you have a clear path. Avoid moving your furniture around. If any of your floors are uneven, fix them. If there are any pets around you, be aware of where they are. Review your medicines with your doctor. Some medicines can make you feel dizzy. This can increase your chance of falling. Ask your doctor what other things that you can do to help prevent falls. This information is not intended to replace advice given to you by your health care provider. Make sure you discuss any questions you have with your health care provider. Document Released: 08/15/2009 Document Revised: 03/26/2016 Document Reviewed: 11/23/2014 Elsevier Interactive Patient Education  2017 [employer].

## 2021-07-29 DIAGNOSIS — C434 Malignant melanoma of scalp and neck: Secondary | ICD-10-CM | POA: Diagnosis not present

## 2021-08-01 ENCOUNTER — Ambulatory Visit: Payer: Medicare Other | Attending: Internal Medicine

## 2021-08-01 ENCOUNTER — Other Ambulatory Visit: Payer: Self-pay

## 2021-08-01 ENCOUNTER — Ambulatory Visit (INDEPENDENT_AMBULATORY_CARE_PROVIDER_SITE_OTHER): Payer: Medicare Other | Admitting: Family

## 2021-08-01 VITALS — BP 131/60 | HR 84 | Temp 97.9°F | Resp 16 | Wt 182.0 lb

## 2021-08-01 DIAGNOSIS — M8580 Other specified disorders of bone density and structure, unspecified site: Secondary | ICD-10-CM

## 2021-08-01 DIAGNOSIS — J61 Pneumoconiosis due to asbestos and other mineral fibers: Secondary | ICD-10-CM

## 2021-08-01 DIAGNOSIS — Z23 Encounter for immunization: Secondary | ICD-10-CM

## 2021-08-01 DIAGNOSIS — K219 Gastro-esophageal reflux disease without esophagitis: Secondary | ICD-10-CM | POA: Diagnosis not present

## 2021-08-01 DIAGNOSIS — R7989 Other specified abnormal findings of blood chemistry: Secondary | ICD-10-CM | POA: Diagnosis not present

## 2021-08-01 DIAGNOSIS — D509 Iron deficiency anemia, unspecified: Secondary | ICD-10-CM | POA: Diagnosis not present

## 2021-08-01 LAB — COMPREHENSIVE METABOLIC PANEL
ALT: 15 U/L (ref 0–53)
AST: 21 U/L (ref 0–37)
Albumin: 4 g/dL (ref 3.5–5.2)
Alkaline Phosphatase: 82 U/L (ref 39–117)
BUN: 16 mg/dL (ref 6–23)
CO2: 27 mEq/L (ref 19–32)
Calcium: 9 mg/dL (ref 8.4–10.5)
Chloride: 103 mEq/L (ref 96–112)
Creatinine, Ser: 0.95 mg/dL (ref 0.40–1.50)
GFR: 74.47 mL/min (ref >60.00)
Glucose, Bld: 145 mg/dL — ABNORMAL HIGH (ref 70–99)
Potassium: 3.9 mEq/L (ref 3.5–5.1)
Sodium: 139 mEq/L (ref 135–145)
Total Bilirubin: 0.9 mg/dL (ref 0.2–1.2)
Total Protein: 7.2 g/dL (ref 6.0–8.3)

## 2021-08-01 LAB — CBC WITH DIFFERENTIAL/PLATELET
Basophils Absolute: 0.1 10*3/uL (ref 0.0–0.1)
Basophils Relative: 1.4 % (ref 0.0–3.0)
Eosinophils Absolute: 0.3 10*3/uL (ref 0.0–0.7)
Eosinophils Relative: 7.3 % — ABNORMAL HIGH (ref 0.0–5.0)
HCT: 37.9 % — ABNORMAL LOW (ref 39.0–52.0)
Hemoglobin: 12.7 g/dL — ABNORMAL LOW (ref 13.0–17.0)
Lymphocytes Relative: 26.7 % (ref 12.0–46.0)
Lymphs Abs: 1.2 10*3/uL (ref 0.7–4.0)
MCHC: 33.4 g/dL (ref 30.0–36.0)
MCV: 98.1 fl (ref 78.0–100.0)
Monocytes Absolute: 0.5 10*3/uL (ref 0.1–1.0)
Monocytes Relative: 10.9 % (ref 3.0–12.0)
Neutro Abs: 2.5 10*3/uL (ref 1.4–7.7)
Neutrophils Relative %: 53.7 % (ref 43.0–77.0)
Platelets: 204 10*3/uL (ref 150.0–400.0)
RBC: 3.87 Mil/uL — ABNORMAL LOW (ref 4.22–5.81)
RDW: 14.8 % (ref 11.5–15.5)
WBC: 4.6 10*3/uL (ref 4.0–10.5)

## 2021-08-01 LAB — IRON: Iron: 112 ug/dL (ref 42–165)

## 2021-08-01 NOTE — Assessment & Plan Note (Signed)
Continue surveillance per pulmonology.

## 2021-08-01 NOTE — Progress Notes (Signed)
   Covid-19 Vaccination Clinic  Name:  Miguel Thomas    MRN: [id number] DOB: September 04, 1941  08/01/2021  [name] was observed post Covid-19 immunization for 15 minutes without incident. He was provided with Vaccine Information Sheet and instruction to access the V-Safe system.   [name] was instructed to call 911 with any severe reactions post vaccine: Difficulty breathing  Swelling of face and throat  A fast heartbeat  A bad rash all over body  Dizziness and weakness

## 2021-08-01 NOTE — Assessment & Plan Note (Signed)
Repeat CMET.

## 2021-08-01 NOTE — Progress Notes (Signed)
Subjective:     Patient ID: Miguel Thomas, male    DOB: December 20, 1940, 80 y.o.   MRN: [id number]  Chief Complaint  Patient presents with   Gastroesophageal Reflux    Here for follow up    Gastroesophageal Reflux  Patient is in today for follow up.   Melanoma- scalp. Continues to follow with dermatology.   GERD- pt continues omeprazole 40mg . Reports that his gerd symptoms are well controlled. If he misses 2 days in a row, his symptoms return. BP Readings from Last 3 Encounters:  08/01/21 131/60  07/01/21 (!) 144/80  05/01/21 124/70   Asbestosis- pt is following every 6 months with pulmonology.  Health Maintenance Due  Topic Date Due   COVID-19 Vaccine (4 - Booster for [employer] series) 12/28/2020    Past Medical History:  Diagnosis Date   Anemia    Cancer ([place]) 10/2019   melanoma on scalp   Dyspnea    had positive covid test 11-04-19   GERD (gastroesophageal reflux disease) 12-12-04   ring like esophageal stricture with reflux esophagitis, also in 2011   Hiatal hernia    History of adenomatous polyp of colon    tubular adenom's in 2006;  2011; 03-05-2015   History of esophageal stricture    s/p  dilatation 2006  and 2012   Osteopenia    Penile ulcer    Plantar fasciitis    Prostate nodule    Pseudophakia of both eyes    Wears hearing aid in both ears     Past Surgical History:  Procedure Laterality Date   APPLICATION OF A-CELL OF EXTREMITY N/A 12/05/2019   Procedure: APPLICATION OF A-CELL;  Surgeon: Stark Klein, MD;  Location: [facility];  Service: General;  Laterality: N/A;   CARDIOVASCULAR STRESS TEST  06/11/2011   normal nuclear study w/ no ischemia/  normal LV function and wall motion, ef 58%   CATARACT EXTRACTION W/ INTRAOCULAR LENS  IMPLANT, BILATERAL  2008 approx.   CHOLECYSTECTOMY N/A 02/27/2020   Procedure: LAPAROSCOPIC CHOLECYSTECTOMY;  Surgeon: [name], MD;  Location: [facility];  Service: General;  Laterality: N/A;   COLONOSCOPY  last one  03-05-2015   ERCP N/A 02/26/2020   Procedure: ENDOSCOPIC RETROGRADE CHOLANGIOPANCREATOGRAPHY (ERCP);  Surgeon: Irene Shipper, MD;  Location: [facility];  Service: Endoscopy;  Laterality: N/A;   ESOPHAGOGASTRODUODENOSCOPY  last one 01-06-2011   INGUINAL HERNIA REPAIR Right 1990's   MELANOMA EXCISION N/A 12/05/2019   Procedure: WIDE LOCAL EXCISION MELANOMA EXCISION OF SCALP;  Surgeon: Stark Klein, MD;  Location: [facility];  Service: General;  Laterality: N/A;   PENILE BIOPSY N/A 03/12/2017   Procedure: EXCISIONAL GLANS PENILE BIOPSY;  Surgeon: Festus Aloe, MD;  Location: [facility];  Service: Urology;  Laterality: N/A;   PROSTATE BIOPSY  11/2011   normal   REMOVAL OF STONES  02/26/2020   Procedure: REMOVAL OF STONES;  Surgeon: Irene Shipper, MD;  Location: [facility];  Service: Endoscopy;;   SKIN FULL THICKNESS GRAFT Left 12/05/2019   Procedure: SKIN GRAFT FULL THICKNESS FROM LEFT UPPER CHEST TO SCALP;  Surgeon: Stark Klein, MD;  Location: [facility];  Service: General;  Laterality: Left;   SPHINCTEROTOMY  02/26/2020   Procedure: SPHINCTEROTOMY;  Surgeon: Irene Shipper, MD;  Location: [facility];  Service: Endoscopy;;   TONSILLECTOMY  child    Family History  Problem Relation Age of Onset   Coronary artery disease Other    Heart  disease Other        rheumatic   Colon cancer Neg Hx    Esophageal cancer Neg Hx    Rectal cancer Neg Hx    Stomach cancer Neg Hx     Social History   Socioeconomic History   Marital status: Widowed    Spouse name: Not on file   Number of children: 1   Years of education: Not on file   Highest education level: Not on file  Occupational History   Occupation: retired  Tobacco Use   Smoking status: Never   Smokeless tobacco: Never  Substance and Sexual Activity   Alcohol use: No    Alcohol/week: 0.0 standard drinks   Drug use: No   Sexual activity: Not Currently  Other Topics Concern   Not on  file  Social History Narrative   Retired   Married (2nd marriage)   Daily Caffeine- use up to [employer] daily         Social Determinants of Radio broadcast assistant Strain: Low Risk    Difficulty of Paying Living Expenses: Not hard at all  Food Insecurity: No Food Insecurity   Worried About Charity fundraiser in the Last Year: Never true   Arboriculturist in the Last Year: Never true  Transportation Needs: No Transportation Needs   Lack of Transportation (Medical): No   Lack of Transportation (Non-Medical): No  Physical Activity: Inactive   Days of Exercise per Week: 0 days   Minutes of Exercise per Session: 0 min  Stress: No Stress Concern Present   Feeling of Stress : Not at all  Social Connections: Moderately Isolated   Frequency of Communication with Friends and Family: More than three times a week   Frequency of Social Gatherings with Friends and Family: More than three times a week   Attends Religious Services: More than 4 times per year   Active Member of [employer] or Organizations: No   Attends Archivist Meetings: Never   Marital Status: Widowed  Human resources officer Violence: Not At Risk   Fear of Current or Ex-Partner: No   Emotionally Abused: No   Physically Abused: No   Sexually Abused: No    Outpatient Medications Prior to Visit  Medication Sig Dispense Refill   omeprazole (PRILOSEC) 40 MG capsule TAKE 1 CAPSULE BY MOUTH ONCE DAILY BEFORE BREAKFAST 30 capsule 5   No facility-administered medications prior to visit.    No Known Allergies  ROS See HPI    Objective:    Physical Exam Constitutional:      General: He is not in acute distress.    Appearance: He is well-developed.  HENT:     Head: Normocephalic and atraumatic.  Cardiovascular:     Rate and Rhythm: Normal rate and regular rhythm.     Heart sounds: No murmur heard. Pulmonary:     Effort: Pulmonary effort is normal. No respiratory distress.     Breath sounds: Rales (fine bibasilar  rales) present. No wheezing.  Skin:    General: Skin is warm and dry.  Neurological:     Mental Status: He is alert and oriented to person, place, and time.  Psychiatric:        Behavior: Behavior normal.        Thought Content: Thought content normal.    BP 131/60 (BP Location: Left Arm, Patient Position: Sitting, Cuff Size: Small)   Pulse 84   Temp 97.9 F (36.6 C) (Oral)  Resp 16   Wt 182 lb (82.6 kg)   SpO2 100%   BMI 25.75 kg/m  Wt Readings from Last 3 Encounters:  08/01/21 182 lb (82.6 kg)  07/01/21 186 lb (84.4 kg)  05/01/21 188 lb 9.6 oz (85.5 kg)       Assessment & Plan:   Problem List Items Addressed This Visit       Unprioritized   Osteopenia - Primary    It has been quite some time since a bone density test was checked. Will update.       Relevant Orders   DG Bone Density   GERD    Stable on omeprazole $RemoveBefor'40mg'[id number]$  once daily.       Elevated LFTs    Repeat CMET.       Relevant Orders   Comp Met (CMET)   Asbestosis ([place])    Continue surveillance per pulmonology.      Other Visit Diagnoses     Iron deficiency anemia, unspecified iron deficiency anemia type       Relevant Orders   CBC with Differential/Platelet   Iron       I am having Miguel Thomas maintain his omeprazole.  No orders of the defined types were placed in this encounter.

## 2021-08-01 NOTE — Patient Instructions (Signed)
Please get your covid booster.  

## 2021-08-01 NOTE — Assessment & Plan Note (Signed)
It has been quite some time since a bone density test was checked. Will update.

## 2021-08-01 NOTE — Assessment & Plan Note (Signed)
Stable on omeprazole 40mg once daily. 

## 2021-08-03 ENCOUNTER — Telehealth: Payer: Self-pay | Admitting: Family

## 2021-08-03 NOTE — Telephone Encounter (Signed)
Could you please ask lab to add on A1C if possible, dx hyperglycemia?

## 2021-08-04 ENCOUNTER — Ambulatory Visit (HOSPITAL_BASED_OUTPATIENT_CLINIC_OR_DEPARTMENT_OTHER)
Admission: RE | Admit: 2021-08-04 | Discharge: 2021-08-04 | Disposition: A | Discharge status: Home / Self Care | Payer: Medicare Other | Source: Ambulatory Visit | Attending: Family | Admitting: Family

## 2021-08-04 ENCOUNTER — Other Ambulatory Visit: Payer: Self-pay

## 2021-08-04 DIAGNOSIS — M8588 Other specified disorders of bone density and structure, other site: Secondary | ICD-10-CM | POA: Diagnosis not present

## 2021-08-04 DIAGNOSIS — Z13820 Encounter for screening for osteoporosis: Secondary | ICD-10-CM | POA: Diagnosis not present

## 2021-08-04 DIAGNOSIS — M8580 Other specified disorders of bone density and structure, unspecified site: Secondary | ICD-10-CM

## 2021-08-07 ENCOUNTER — Encounter: Payer: Self-pay | Admitting: Family

## 2021-08-08 ENCOUNTER — Telehealth: Payer: Self-pay | Admitting: Family

## 2021-08-08 ENCOUNTER — Other Ambulatory Visit: Payer: Self-pay

## 2021-08-08 DIAGNOSIS — E1065 Type 1 diabetes mellitus with hyperglycemia: Secondary | ICD-10-CM

## 2021-08-08 NOTE — Progress Notes (Signed)
Mailed out to patient 

## 2021-08-08 NOTE — Telephone Encounter (Signed)
Please advise pt that he is still mildly anemic but his iron levels are OK.    His sugar was elevated. I would like him to return to the lab at his convenience please for A1C dx hyperglycemia.

## 2021-08-08 NOTE — Telephone Encounter (Signed)
Patient advised of results, scheduled to come in for additional labs next week

## 2021-08-11 ENCOUNTER — Other Ambulatory Visit (INDEPENDENT_AMBULATORY_CARE_PROVIDER_SITE_OTHER): Payer: Medicare Other

## 2021-08-11 ENCOUNTER — Other Ambulatory Visit: Payer: Self-pay

## 2021-08-11 DIAGNOSIS — E1065 Type 1 diabetes mellitus with hyperglycemia: Secondary | ICD-10-CM | POA: Diagnosis not present

## 2021-08-11 LAB — HEMOGLOBIN A1C: Hgb A1c MFr Bld: 5.4 % (ref 4.6–6.5)

## 2021-08-11 NOTE — Progress Notes (Signed)
Patient and son advised of normal results.

## 2021-08-12 ENCOUNTER — Other Ambulatory Visit (HOSPITAL_BASED_OUTPATIENT_CLINIC_OR_DEPARTMENT_OTHER): Payer: Self-pay

## 2021-08-12 ENCOUNTER — Other Ambulatory Visit: Payer: Medicare Other

## 2021-08-12 MED ORDER — COVID-19MRNA BIVAL VACC PFIZER 30 MCG/0.3ML IM SUSP
INTRAMUSCULAR | 0 refills | Status: DC
  Filled 2021-08-12: qty 0.3, 1d supply, fill #0

## 2021-09-22 DIAGNOSIS — C434 Malignant melanoma of scalp and neck: Secondary | ICD-10-CM | POA: Diagnosis not present

## 2021-10-15 ENCOUNTER — Other Ambulatory Visit: Payer: Self-pay | Admitting: Internal Medicine

## 2021-11-10 ENCOUNTER — Ambulatory Visit: Payer: Medicare Other | Admitting: Family

## 2021-11-14 ENCOUNTER — Other Ambulatory Visit: Payer: Self-pay | Admitting: Internal Medicine

## 2021-11-24 DIAGNOSIS — C434 Malignant melanoma of scalp and neck: Secondary | ICD-10-CM | POA: Diagnosis not present

## 2021-11-26 DIAGNOSIS — Z859 Personal history of malignant neoplasm, unspecified: Secondary | ICD-10-CM | POA: Diagnosis not present

## 2021-11-26 DIAGNOSIS — L2089 Other atopic dermatitis: Secondary | ICD-10-CM | POA: Diagnosis not present

## 2021-11-26 DIAGNOSIS — L820 Inflamed seborrheic keratosis: Secondary | ICD-10-CM | POA: Diagnosis not present

## 2021-11-26 DIAGNOSIS — Z85820 Personal history of malignant melanoma of skin: Secondary | ICD-10-CM | POA: Diagnosis not present

## 2021-11-26 DIAGNOSIS — L570 Actinic keratosis: Secondary | ICD-10-CM | POA: Diagnosis not present

## 2021-11-26 DIAGNOSIS — L578 Other skin changes due to chronic exposure to nonionizing radiation: Secondary | ICD-10-CM | POA: Diagnosis not present

## 2021-11-26 DIAGNOSIS — L821 Other seborrheic keratosis: Secondary | ICD-10-CM | POA: Diagnosis not present

## 2021-12-15 ENCOUNTER — Ambulatory Visit (INDEPENDENT_AMBULATORY_CARE_PROVIDER_SITE_OTHER): Payer: Medicare Other | Admitting: Pulmonary Disease

## 2021-12-15 ENCOUNTER — Other Ambulatory Visit: Payer: Self-pay | Admitting: Internal Medicine

## 2021-12-15 ENCOUNTER — Ambulatory Visit: Payer: Medicare Other | Admitting: Pulmonary Disease

## 2021-12-15 ENCOUNTER — Other Ambulatory Visit: Payer: Self-pay

## 2021-12-15 ENCOUNTER — Encounter: Payer: Self-pay | Admitting: Pulmonary Disease

## 2021-12-15 VITALS — BP 124/76 | HR 78 | Ht 70.0 in | Wt 179.6 lb

## 2021-12-15 DIAGNOSIS — J61 Pneumoconiosis due to asbestos and other mineral fibers: Secondary | ICD-10-CM

## 2021-12-15 NOTE — Patient Instructions (Signed)
Your breathing tests are within normal limits.  We will check a high resolution CT chest scan and call you with the results  Follow up in 1 year with pulmonary function tests.

## 2021-12-15 NOTE — Progress Notes (Signed)
PFT done today. 

## 2021-12-15 NOTE — Progress Notes (Signed)
Synopsis: Referred in June 2022 for Asbestosis by [name], NP  Subjective:   PATIENT ID: Miguel Thomas GENDER: male DOB: [birth year], MRN: [id number]   HPI  Chief Complaint  Patient presents with   Follow-up    F/U after PFT. States his breathing has been stable since last visit.    Miguel Thomas is an 81 year old male, never smoker with GERD and hiatal hernia who returns to pulmonary clinic for pulmonary asbestosis.   He is accompanied by his son. He remains active around his house and has no limitations with his activities of daily living.   Pulmonary function tests today are within normal limits.  OV 05/01/21 He had a CT Chest scan done 12/2020 which showed scattered calcified pleural plaques bilaterally. No pleural effusions. Moderate patchy confluent subpleural reticulation and ground-glass attenuation throughout both lungs with associated mild traction bronchiectasis and architectural distortion.   He was in the [employer] for 8 years. He worked as a Chiropodist. He is not aware of any overt asbestos exposure. He tested positive for covid 19 11/2020 and reports having a mild case quarantining at home.   He denies any dyspnea, cough or wheezing. He continues to take care of the yard work at his home and denies any limitations to his day to day activities.   Past Medical History:  Diagnosis Date   Anemia    Cancer ([place]) 10/2019   melanoma on scalp   Dyspnea    had positive covid test 11-04-19   GERD (gastroesophageal reflux disease) 12/12/2004   ring like esophageal stricture with reflux esophagitis, also in 2011   Hiatal hernia    History of adenomatous polyp of colon    tubular adenom's in 2006;  2011; 03-05-2015   History of esophageal stricture    s/p  dilatation 2006  and 2012   Penile ulcer    Plantar fasciitis    Prostate nodule    Pseudophakia of both eyes    Wears hearing aid in both ears      Family History  Problem Relation Age of Onset    Coronary artery disease Other    Heart disease Other        rheumatic   Colon cancer Neg Hx    Esophageal cancer Neg Hx    Rectal cancer Neg Hx    Stomach cancer Neg Hx      Social History   Socioeconomic History   Marital status: Widowed    Spouse name: Not on file   Number of children: 1   Years of education: Not on file   Highest education level: Not on file  Occupational History   Occupation: retired  Tobacco Use   Smoking status: Never   Smokeless tobacco: Never  Substance and Sexual Activity   Alcohol use: No    Alcohol/week: 0.0 standard drinks   Drug use: No   Sexual activity: Not Currently  Other Topics Concern   Not on file  Social History Narrative   Retired   Married (2nd marriage)   Daily Caffeine- use up to [employer] daily         Social Determinants of Radio broadcast assistant Strain: Low Risk    Difficulty of Paying Living Expenses: Not hard at all  Food Insecurity: No Food Insecurity   Worried About Charity fundraiser in the Last Year: Never true   Arboriculturist in the Last Year: Never true  Transportation  Needs: No Transportation Needs   Lack of Transportation (Medical): No   Lack of Transportation (Non-Medical): No  Physical Activity: Inactive   Days of Exercise per Week: 0 days   Minutes of Exercise per Session: 0 min  Stress: No Stress Concern Present   Feeling of Stress : Not at all  Social Connections: Moderately Isolated   Frequency of Communication with Friends and Family: More than three times a week   Frequency of Social Gatherings with Friends and Family: More than three times a week   Attends Religious Services: More than 4 times per year   Active Member of [employer] or Organizations: No   Attends Archivist Meetings: Never   Marital Status: Widowed  Human resources officer Violence: Not At Risk   Fear of Current or Ex-Partner: No   Emotionally Abused: No   Physically Abused: No   Sexually Abused: No     No Known Allergies    Outpatient Medications Prior to Visit  Medication Sig Dispense Refill   COVID-19 mRNA bivalent vaccine, Pfizer, injection Inject into the muscle. 0.3 mL 0   omeprazole (PRILOSEC) 40 MG capsule TAKE 1 CAPSULE BY MOUTH ONCE DAILY BEFORE BREAKFAST 30 capsule 0   No facility-administered medications prior to visit.    Review of Systems  Constitutional:  Negative for chills, fever, malaise/fatigue and weight loss.  HENT:  Positive for hearing loss. Negative for congestion, sinus pain and sore throat.   Eyes: Negative.   Respiratory:  Negative for cough, hemoptysis, sputum production, shortness of breath and wheezing.   Cardiovascular:  Negative for chest pain, palpitations, orthopnea, claudication and leg swelling.  Gastrointestinal:  Negative for abdominal pain, heartburn, nausea and vomiting.  Genitourinary: Negative.   Musculoskeletal:  Negative for joint pain and myalgias.  Skin:  Negative for rash.  Neurological:  Negative for weakness.  Endo/Heme/Allergies: Negative.   Psychiatric/Behavioral: Negative.     Objective:   Vitals:   12/15/21 1606  BP: 124/76  Pulse: 78  SpO2: 98%  Weight: 179 lb 9.6 oz (81.5 kg)  Height: 5\' 10"  (1.778 m)   Physical Exam Constitutional:      General: He is not in acute distress. HENT:     Head: Normocephalic and atraumatic.  Eyes:     Extraocular Movements: Extraocular movements intact.     Conjunctiva/sclera: Conjunctivae normal.     Pupils: Pupils are equal, round, and reactive to light.  Cardiovascular:     Rate and Rhythm: Normal rate and regular rhythm.     Pulses: Normal pulses.     Heart sounds: Normal heart sounds. No murmur heard. Pulmonary:     Breath sounds: Rales (scattered at bilateral bases) present.  Abdominal:     General: Bowel sounds are normal.     Palpations: Abdomen is soft.  Musculoskeletal:     Right lower leg: No edema.     Left lower leg: No edema.  Lymphadenopathy:     Cervical: No cervical adenopathy.   Skin:    General: Skin is warm and dry.  Neurological:     General: No focal deficit present.     Mental Status: He is alert.  Psychiatric:        Mood and Affect: Mood normal.        Behavior: Behavior normal.        Thought Content: Thought content normal.        Judgment: Judgment normal.    CBC    Component Value Date/Time  WBC 4.6 08/01/2021 0809   RBC 3.87 (L) 08/01/2021 0809   HGB 12.7 (L) 08/01/2021 0809   HCT 37.9 (L) 08/01/2021 0809   PLT 204.0 08/01/2021 0809   MCV 98.1 08/01/2021 0809   MCH 33.2 (H) 07/31/2020 0738   MCHC 33.4 08/01/2021 0809   RDW 14.8 08/01/2021 0809   LYMPHSABS 1.2 08/01/2021 0809   MONOABS 0.5 08/01/2021 0809   EOSABS 0.3 08/01/2021 0809   BASOSABS 0.1 08/01/2021 0809   BMP Latest Ref Rng & Units 08/01/2021 03/06/2020 02/29/2020  Glucose 70 - 99 mg/dL 145(H) 93 77  BUN 6 - 23 mg/dL 16 16 18   Creatinine 0.40 - 1.50 mg/dL 0.95 0.76 0.76  Sodium 135 - 145 mEq/L 139 137 137  Potassium 3.5 - 5.1 mEq/L 3.9 4.0 3.3(L)  Chloride 96 - 112 mEq/L 103 103 105  CO2 19 - 32 mEq/L 27 29 25   Calcium 8.4 - 10.5 mg/dL 9.0 8.2(L) 7.7(L)   Chest imaging: CT Chest w contrast 12/20/20 Mediastinum/Nodes: No discrete thyroid nodules. Unremarkable esophagus. No pathologically enlarged axillary, mediastinal or hilar lymph nodes.   Lungs/Pleura: No pneumothorax. A few scattered calcified pleural plaques are noted bilaterally anteriorly, posteriorly and along the hemidiaphragms. No pleural effusions. No acute consolidative airspace disease, lung masses or significant pulmonary nodules. Moderate patchy confluent subpleural reticulation and ground-glass attenuation throughout both lungs with associated mild traction bronchiectasis and architectural distortion. No clear apicobasilar gradient to these findings. Mild honeycombing scattered in dependent lower lobes bilaterally.  Barium Swallow 02/2020 1. Severe gastroesophageal reflux. 2. Small reducible hiatal  hernia. 3. No evidence of esophageal stricture or ulceration.  PFT: PFT Results Latest Ref Rng & Units 12/15/2021  FVC-Pre L 4.10  FVC-Predicted Pre % 105  FVC-Post L 4.03  FVC-Predicted Post % 103  Pre FEV1/FVC % % 79  Post FEV1/FCV % % 81  FEV1-Pre L 3.26  FEV1-Predicted Pre % 118  FEV1-Post L 3.27  DLCO uncorrected ml/min/mmHg 19.39  DLCO UNC% % 80  DLCO corrected ml/min/mmHg 19.39  DLCO COR %Predicted % 80  DLVA Predicted % 85  2023: PFTs within normal limits   Assessment & Plan:   Pulmonary asbestosis ([place]) - Plan: CT Chest High Resolution, Pulmonary Function Test  Discussion: GASPAR FOWLE is an 81 year old male, never smoker with GERD and hiatal hernia who returns  to pulmonary clinic for pulmonary asbestosis.   Patient has evidence of pulmonary fibrosis and pleural plaques on CT chest scan from 12/2020 which is indicative of pulmonary asbestosis. Fortunately pulmonary function tests today are within normal limits and he remains active on a daily basis.   We again discussed potential treatment options with antifibrotic medications with the patient and his son, but I did not recommend this at this time due to the side effect profile creating more risks than benefits.  The patient continues to remain active on a daily basis without any limitations from a respiratory standpoint.  Follow-up in 1 year.  Freda Jackson, MD [facility] Office: [phone]   Current Outpatient Medications:    COVID-19 mRNA bivalent vaccine, Pfizer, injection, Inject into the muscle., Disp: 0.3 mL, Rfl: 0   omeprazole (PRILOSEC) 40 MG capsule, TAKE 1 CAPSULE BY MOUTH ONCE DAILY BEFORE BREAKFAST, Disp: 30 capsule, Rfl: 0

## 2021-12-23 ENCOUNTER — Encounter: Payer: Self-pay | Admitting: Pulmonary Disease

## 2021-12-23 LAB — PULMONARY FUNCTION TEST
DL/VA % pred: 85 %
DL/VA: 3.27 ml/min/mmHg/L
DLCO cor % pred: 80 %
DLCO cor: 19.39 ml/min/mmHg
DLCO unc % pred: 80 %
DLCO unc: 19.39 ml/min/mmHg
FEF 25-75 Post: 3.06 L/sec
FEF 25-75 Pre: 3.21 L/sec
FEF2575-%Change-Post: -4 %
FEF2575-%Pred-Post: 167 %
FEF2575-%Pred-Pre: 175 %
FEV1-%Change-Post: 0 %
FEV1-%Pred-Post: 119 %
FEV1-%Pred-Pre: 118 %
FEV1-Post: 3.27 L
FEV1-Pre: 3.26 L
FEV1FVC-%Change-Post: 2 %
FEV1FVC-%Pred-Pre: 112 %
FEV6-%Change-Post: 0 %
FEV6-%Pred-Post: 110 %
FEV6-%Pred-Pre: 110 %
FEV6-Post: 4.01 L
FEV6-Pre: 4.02 L
FEV6FVC-%Change-Post: 0 %
FEV6FVC-%Pred-Post: 106 %
FEV6FVC-%Pred-Pre: 106 %
FVC-%Change-Post: -1 %
FVC-%Pred-Post: 103 %
FVC-%Pred-Pre: 105 %
FVC-Post: 4.03 L
FVC-Pre: 4.1 L
Post FEV1/FVC ratio: 81 %
Post FEV6/FVC ratio: 99 %
Pre FEV1/FVC ratio: 79 %
Pre FEV6/FVC Ratio: 99 %

## 2022-01-07 ENCOUNTER — Other Ambulatory Visit: Payer: Self-pay

## 2022-01-07 ENCOUNTER — Ambulatory Visit (INDEPENDENT_AMBULATORY_CARE_PROVIDER_SITE_OTHER)
Admission: RE | Admit: 2022-01-07 | Discharge: 2022-01-07 | Disposition: A | Discharge status: Home / Self Care | Payer: Medicare Other | Source: Ambulatory Visit | Attending: Pulmonary Disease | Admitting: Pulmonary Disease

## 2022-01-07 DIAGNOSIS — Z77090 Contact with and (suspected) exposure to asbestos: Secondary | ICD-10-CM | POA: Diagnosis not present

## 2022-01-07 DIAGNOSIS — J61 Pneumoconiosis due to asbestos and other mineral fibers: Secondary | ICD-10-CM | POA: Diagnosis not present

## 2022-01-07 DIAGNOSIS — J949 Pleural condition, unspecified: Secondary | ICD-10-CM | POA: Diagnosis not present

## 2022-01-07 DIAGNOSIS — I2510 Atherosclerotic heart disease of native coronary artery without angina pectoris: Secondary | ICD-10-CM | POA: Diagnosis not present

## 2022-01-07 DIAGNOSIS — J849 Interstitial pulmonary disease, unspecified: Secondary | ICD-10-CM | POA: Diagnosis not present

## 2022-01-12 ENCOUNTER — Other Ambulatory Visit: Payer: Self-pay | Admitting: Internal Medicine

## 2022-01-12 DIAGNOSIS — H52203 Unspecified astigmatism, bilateral: Secondary | ICD-10-CM | POA: Diagnosis not present

## 2022-01-12 DIAGNOSIS — Z961 Presence of intraocular lens: Secondary | ICD-10-CM | POA: Diagnosis not present

## 2022-01-12 DIAGNOSIS — H5213 Myopia, bilateral: Secondary | ICD-10-CM | POA: Diagnosis not present

## 2022-01-12 DIAGNOSIS — H524 Presbyopia: Secondary | ICD-10-CM | POA: Diagnosis not present

## 2022-01-19 DIAGNOSIS — C434 Malignant melanoma of scalp and neck: Secondary | ICD-10-CM | POA: Diagnosis not present

## 2022-02-09 ENCOUNTER — Ambulatory Visit: Payer: Medicare Other | Admitting: Family

## 2022-02-09 ENCOUNTER — Other Ambulatory Visit: Payer: Self-pay | Admitting: Internal Medicine

## 2022-02-11 ENCOUNTER — Ambulatory Visit (INDEPENDENT_AMBULATORY_CARE_PROVIDER_SITE_OTHER): Payer: Medicare Other | Admitting: Family

## 2022-02-11 VITALS — BP 135/65 | HR 76 | Temp 98.3°F | Resp 16 | Wt 182.0 lb

## 2022-02-11 DIAGNOSIS — K219 Gastro-esophageal reflux disease without esophagitis: Secondary | ICD-10-CM

## 2022-02-11 DIAGNOSIS — R7989 Other specified abnormal findings of blood chemistry: Secondary | ICD-10-CM

## 2022-02-11 DIAGNOSIS — J61 Pneumoconiosis due to asbestos and other mineral fibers: Secondary | ICD-10-CM | POA: Diagnosis not present

## 2022-02-11 DIAGNOSIS — I2510 Atherosclerotic heart disease of native coronary artery without angina pectoris: Secondary | ICD-10-CM | POA: Diagnosis not present

## 2022-02-11 DIAGNOSIS — R739 Hyperglycemia, unspecified: Secondary | ICD-10-CM

## 2022-02-11 LAB — COMPREHENSIVE METABOLIC PANEL
ALT: 15 U/L (ref 0–53)
AST: 20 U/L (ref 0–37)
Albumin: 3.9 g/dL (ref 3.5–5.2)
Alkaline Phosphatase: 75 U/L (ref 39–117)
BUN: 14 mg/dL (ref 6–23)
CO2: 27 mEq/L (ref 19–32)
Calcium: 8.5 mg/dL (ref 8.4–10.5)
Chloride: 105 mEq/L (ref 96–112)
Creatinine, Ser: 0.85 mg/dL (ref 0.40–1.50)
GFR: 80.55 mL/min (ref >60.00)
Glucose, Bld: 117 mg/dL — ABNORMAL HIGH (ref 70–99)
Potassium: 3.6 mEq/L (ref 3.5–5.1)
Sodium: 140 mEq/L (ref 135–145)
Total Bilirubin: 0.8 mg/dL (ref 0.2–1.2)
Total Protein: 6.7 g/dL (ref 6.0–8.3)

## 2022-02-11 LAB — HEMOGLOBIN A1C: Hgb A1c MFr Bld: 5.3 % (ref 4.6–6.5)

## 2022-02-11 MED ORDER — OMEPRAZOLE 40 MG PO CPDR: 40.0000 mg | DELAYED_RELEASE_CAPSULE | Freq: Every day | ORAL | 1 refills | Status: DC

## 2022-02-11 NOTE — Assessment & Plan Note (Signed)
Incidental finding- will refer to cardiology for further evaluation.  ?

## 2022-02-11 NOTE — Progress Notes (Addendum)
? ?Subjective:  ? ?By signing my name below, I, Shehryar Baig, attest that this documentation has been prepared under the direction and in the presence of [name], NP. 02/11/2022 ? ? ? Patient ID: Miguel Thomas, male    DOB: 22-Oct-1941, 81 y.o.   MRN: [id number] ? ?Chief Complaint  ?Patient presents with  ? Gastroesophageal Reflux  ?  "Doing well on prilosec"  ? Anemia  ?  History of iron deficiency anemia  ? ? ?HPI ?Patient is in today for a follow up visit.  ? ?Omeprazole- His pharmacy did not have his 40 mg omeprazole refilled so he did not take them recently. He is requesting a refill on it.  ?Melanoma- He reports seeing a general surgeon to remove a lesion on his scalp for biopsy and found it was malignant melanoma. He seen his dermatologist and reports his scab from the procedure is healing well.  ?Pulmonologist- February 13 he seen his pulmonologist. He had an CT scan ordered on the 3rd of march but did not review the results with his provider.  ?Cardiology- He is interested in seeing a cardiologist.  ?Exercise- He exercises by doing yard work. He reports spending 3 hours last week doing yard work and his oxygen levels measured normal after he was done.  ?Immunizations- He reports receiving his bivalent Covid-19 vaccine after his last visit. He notes feeling slightly dizzy following his last Covid-19 vaccine.  ?Dental- He is UTD on dental care.  ?Vision- He is UTD on vision care. He reports no new issues.  ? ? ?Health Maintenance Due  ?Topic Date Due  ? URINE MICROALBUMIN  Never done  ? ? ?Past Medical History:  ?Diagnosis Date  ? Anemia   ? Cancer ([place]) 10/2019  ? melanoma on scalp  ? Dyspnea   ? had positive covid test 11-04-19  ? GERD (gastroesophageal reflux disease) 12/12/2004  ? ring like esophageal stricture with reflux esophagitis, also in 2011  ? Hiatal hernia   ? History of adenomatous polyp of colon   ? tubular adenom's in 2006;  2011; 03-05-2015  ? History of esophageal stricture   ? s/p   dilatation 2006  and 2012  ? Penile ulcer   ? Plantar fasciitis   ? Prostate nodule   ? Pseudophakia of both eyes   ? Wears hearing aid in both ears   ? ? ?Past Surgical History:  ?Procedure Laterality Date  ? APPLICATION OF A-CELL OF EXTREMITY N/A 12/05/2019  ? Procedure: APPLICATION OF A-CELL;  Surgeon: Stark Klein, MD;  Location: [facility];  Service: General;  Laterality: N/A;  ? CARDIOVASCULAR STRESS TEST  06/11/2011  ? normal nuclear study w/ no ischemia/  normal LV function and wall motion, ef 58%  ? CATARACT EXTRACTION W/ INTRAOCULAR LENS  IMPLANT, BILATERAL  2008 approx.  ? CHOLECYSTECTOMY N/A 02/27/2020  ? Procedure: LAPAROSCOPIC CHOLECYSTECTOMY;  Surgeon: [name], MD;  Location: [facility];  Service: General;  Laterality: N/A;  ? COLONOSCOPY  last one 03-05-2015  ? ERCP N/A 02/26/2020  ? Procedure: ENDOSCOPIC RETROGRADE CHOLANGIOPANCREATOGRAPHY (ERCP);  Surgeon: Irene Shipper, MD;  Location: [facility];  Service: Endoscopy;  Laterality: N/A;  ? ESOPHAGOGASTRODUODENOSCOPY  last one 01-06-2011  ? INGUINAL HERNIA REPAIR Right 1990's  ? MELANOMA EXCISION N/A 12/05/2019  ? Procedure: WIDE LOCAL EXCISION MELANOMA EXCISION OF SCALP;  Surgeon: Stark Klein, MD;  Location: [facility];  Service: General;  Laterality: N/A;  ? PENILE BIOPSY N/A 03/12/2017  ?  Procedure: EXCISIONAL GLANS PENILE BIOPSY;  Surgeon: Festus Aloe, MD;  Location: [facility];  Service: Urology;  Laterality: N/A;  ? PROSTATE BIOPSY  11/2011  ? normal  ? REMOVAL OF STONES  02/26/2020  ? Procedure: REMOVAL OF STONES;  Surgeon: Irene Shipper, MD;  Location: [facility];  Service: Endoscopy;;  ? SKIN FULL THICKNESS GRAFT Left 12/05/2019  ? Procedure: SKIN GRAFT FULL THICKNESS FROM LEFT UPPER CHEST TO SCALP;  Surgeon: Stark Klein, MD;  Location: [facility];  Service: General;  Laterality: Left;  ? SPHINCTEROTOMY  02/26/2020  ? Procedure: SPHINCTEROTOMY;  Surgeon: Irene Shipper, MD;   Location: [facility];  Service: Endoscopy;;  ? TONSILLECTOMY  child  ? ? ?Family History  ?Problem Relation Age of Onset  ? Coronary artery disease Other   ? Heart disease Other   ?     rheumatic  ? Colon cancer Neg Hx   ? Esophageal cancer Neg Hx   ? Rectal cancer Neg Hx   ? Stomach cancer Neg Hx   ? ? ?Social History  ? ?Socioeconomic History  ? Marital status: Widowed  ?  Spouse name: Not on file  ? Number of children: 1  ? Years of education: Not on file  ? Highest education level: Not on file  ?Occupational History  ? Occupation: retired  ?Tobacco Use  ? Smoking status: Never  ? Smokeless tobacco: Never  ?Substance and Sexual Activity  ? Alcohol use: No  ?  Alcohol/week: 0.0 standard drinks  ? Drug use: No  ? Sexual activity: Not Currently  ?Other Topics Concern  ? Not on file  ?Social History Narrative  ? Retired  ? Married (2nd marriage)  ? Daily Caffeine- use up to 6/ daily  ?   ?   ? ?Social Determinants of Health  ? ?Financial Resource Strain: Low Risk   ? Difficulty of Paying Living Expenses: Not hard at all  ?Food Insecurity: No Food Insecurity  ? Worried About Charity fundraiser in the Last Year: Never true  ? Ran Out of Food in the Last Year: Never true  ?Transportation Needs: No Transportation Needs  ? Lack of Transportation (Medical): No  ? Lack of Transportation (Non-Medical): No  ?Physical Activity: Inactive  ? Days of Exercise per Week: 0 days  ? Minutes of Exercise per Session: 0 min  ?Stress: No Stress Concern Present  ? Feeling of Stress : Not at all  ?Social Connections: Moderately Isolated  ? Frequency of Communication with Friends and Family: More than three times a week  ? Frequency of Social Gatherings with Friends and Family: More than three times a week  ? Attends Religious Services: More than 4 times per year  ? Active Member of Clubs or Organizations: No  ? Attends Archivist Meetings: Never  ? Marital Status: Widowed  ?Intimate Partner Violence: Not At Risk  ? Fear of  Current or Ex-Partner: No  ? Emotionally Abused: No  ? Physically Abused: No  ? Sexually Abused: No  ? ? ?Outpatient Medications Prior to Visit  ?Medication Sig Dispense Refill  ? COVID-19 mRNA bivalent vaccine, Pfizer, injection Inject into the muscle. 0.3 mL 0  ? omeprazole (PRILOSEC) 40 MG capsule TAKE 1 CAPSULE BY MOUTH ONCE DAILY BEFORE BREAKFAST 30 capsule 0  ? ?No facility-administered medications prior to visit.  ? ? ?No Known Allergies ? ?Review of Systems  ?Skin:   ?     (+)scab on  top of scalp  ? ?   ?Objective:  ?  ?Physical Exam ?Constitutional:   ?   General: He is not in acute distress. ?   Appearance: Normal appearance. He is not ill-appearing.  ?HENT:  ?   Head: Normocephalic and atraumatic.  ?   Right Ear: External ear normal.  ?   Left Ear: External ear normal.  ?Eyes:  ?   Extraocular Movements: Extraocular movements intact.  ?   Pupils: Pupils are equal, round, and reactive to light.  ?Cardiovascular:  ?   Rate and Rhythm: Normal rate and regular rhythm.  ?   Heart sounds: Normal heart sounds. No murmur heard. ?  No gallop.  ?Pulmonary:  ?   Effort: Pulmonary effort is normal. No respiratory distress.  ?   Breath sounds: Normal breath sounds. No wheezing or rales.  ?Skin: ?   General: Skin is warm and dry.  ?   Comments: Large scab on top of scalp  ?Neurological:  ?   Mental Status: He is alert and oriented to person, place, and time.  ?Psychiatric:     ?   Judgment: Judgment normal.  ? ? ?BP 135/65 (BP Location: Right Arm, Patient Position: Sitting, Cuff Size: Small)   Pulse 76   Temp 98.3 ?F (36.8 ?C) (Oral)   Resp 16   Wt 182 lb (82.6 kg)   SpO2 100%   BMI 26.11 kg/m?  ?Wt Readings from Last 3 Encounters:  ?02/11/22 182 lb (82.6 kg)  ?12/15/21 179 lb 9.6 oz (81.5 kg)  ?08/01/21 182 lb (82.6 kg)  ? ? ?   ?Assessment & Plan:  ? ?Problem List Items Addressed This Visit   ? ?  ? Unprioritized  ? GERD  ?  Stable. Continue omeprazole.  ?  ?  ? Relevant Medications  ? omeprazole (PRILOSEC) 40  MG capsule  ? Elevated LFTs  ? Relevant Orders  ? Comp Met (CMET)  ? Coronary artery calcification seen on CT scan - Primary  ?  Incidental finding- will refer to cardiology for further evaluation.  ?  ?  ? Releva

## 2022-02-11 NOTE — Assessment & Plan Note (Signed)
Stable.  Continue omeprazole.

## 2022-02-11 NOTE — Assessment & Plan Note (Signed)
Following with pulmonology. Clinically stable. ?

## 2022-02-12 ENCOUNTER — Encounter: Payer: Self-pay | Admitting: Family

## 2022-02-12 NOTE — Progress Notes (Signed)
Mailed out to pt 

## 2022-02-16 NOTE — Progress Notes (Signed)
CARDIOLOGY CONSULT NOTE  ? ? ? ? ? ?Patient ID: ?Miguel Thomas ?MRN: [id number] ?DOB/AGE: 07/22/1941 81 y.o. ? ?Admit date: (Not on file) ?Referring Physician: Inda Castle ?Primary Physician: [name], NP ?Primary Cardiologist: New ?Reason for Consultation: CAD ? ? ?HPI:  81 y.o. referred by Dr Inda Castle for coronary calcium seen on CT scan History of anemia GERD, Hiatal Hernia He is retired / widowed. Does yard work and ADL's No history of vascular dx or CAD. History of asbestosis exposure CT chest 01/08/22 noted LM/3 vessel coronary calcium Abnormal lung parenchyma with asbestosis related pleural dx and advanced interstitial lung disease ? UIP progressive since CT done 12/22/20 Seen by Dr Erin Fulling 12/15/21 indicated normal PFTls  ? ?He was in the [employer] for 8 years. He worked as a Chiropodist. He is not aware of any overt asbestos exposure. He tested positive for covid 19 11/2020 and reports having a mild case quarantining at home.Antifibrotic RX deferred at this time given activity level and normal PFTls ? ?Seen by Dr Mare Ferrari in 2012 with normal myovue  ? ?He is widowed since 2021 Lives independently Does all ADL's and and mow the lawn. He has no chest pain mild dyspnea from lung dx. No palpitations or syncope   ? ?He is very hard of hearing He has a eschar on top of his head from melanoma and sees dermatology  ? ?ROS ?All other systems reviewed and negative except as noted above ? ?Past Medical History:  ?Diagnosis Date  ? Anemia   ? Cancer ([place]) 10/2019  ? melanoma on scalp  ? Dyspnea   ? had positive covid test 11-04-19  ? GERD (gastroesophageal reflux disease) 12/12/2004  ? ring like esophageal stricture with reflux esophagitis, also in 2011  ? Hiatal hernia   ? History of adenomatous polyp of colon   ? tubular adenom's in 2006;  2011; 03-05-2015  ? History of esophageal stricture   ? s/p  dilatation 2006  and 2012  ? Penile ulcer   ? Plantar fasciitis   ? Prostate nodule   ? Pseudophakia of both  eyes   ? Wears hearing aid in both ears   ?  ?Family History  ?Problem Relation Age of Onset  ? Coronary artery disease Other   ? Heart disease Other   ?     rheumatic  ? Colon cancer Neg Hx   ? Esophageal cancer Neg Hx   ? Rectal cancer Neg Hx   ? Stomach cancer Neg Hx   ?  ?Social History  ? ?Socioeconomic History  ? Marital status: Widowed  ?  Spouse name: Not on file  ? Number of children: 1  ? Years of education: Not on file  ? Highest education level: Not on file  ?Occupational History  ? Occupation: retired  ?Tobacco Use  ? Smoking status: Never  ? Smokeless tobacco: Never  ?Substance and Sexual Activity  ? Alcohol use: No  ?  Alcohol/week: 0.0 standard drinks  ? Drug use: No  ? Sexual activity: Not Currently  ?Other Topics Concern  ? Not on file  ?Social History Narrative  ? Retired  ? Married (2nd marriage)  ? Daily Caffeine- use up to 6/ daily  ?   ?   ? ?Social Determinants of Health  ? ?Financial Resource Strain: Low Risk   ? Difficulty of Paying Living Expenses: Not hard at all  ?Food Insecurity: No Food Insecurity  ? Worried About Charity fundraiser in  the Last Year: Never true  ? Ran Out of Food in the Last Year: Never true  ?Transportation Needs: No Transportation Needs  ? Lack of Transportation (Medical): No  ? Lack of Transportation (Non-Medical): No  ?Physical Activity: Inactive  ? Days of Exercise per Week: 0 days  ? Minutes of Exercise per Session: 0 min  ?Stress: No Stress Concern Present  ? Feeling of Stress : Not at all  ?Social Connections: Moderately Isolated  ? Frequency of Communication with Friends and Family: More than three times a week  ? Frequency of Social Gatherings with Friends and Family: More than three times a week  ? Attends Religious Services: More than 4 times per year  ? Active Member of Clubs or Organizations: No  ? Attends Archivist Meetings: Never  ? Marital Status: Widowed  ?Intimate Partner Violence: Not At Risk  ? Fear of Current or Ex-Partner: No  ?  Emotionally Abused: No  ? Physically Abused: No  ? Sexually Abused: No  ?  ?Past Surgical History:  ?Procedure Laterality Date  ? APPLICATION OF A-CELL OF EXTREMITY N/A 12/05/2019  ? Procedure: APPLICATION OF A-CELL;  Surgeon: Stark Klein, MD;  Location: [place];  Service: General;  Laterality: N/A;  ? CARDIOVASCULAR STRESS TEST  06/11/2011  ? normal nuclear study w/ no ischemia/  normal LV function and wall motion, ef 58%  ? CATARACT EXTRACTION W/ INTRAOCULAR LENS  IMPLANT, BILATERAL  2008 approx.  ? CHOLECYSTECTOMY N/A 02/27/2020  ? Procedure: LAPAROSCOPIC CHOLECYSTECTOMY;  Surgeon: [name], MD;  [facility];  Service: General;  Laterality: N/A;  ? COLONOSCOPY  last one 03-05-2015  ? ERCP N/A 02/26/2020  ? Procedure: ENDOSCOPIC RETROGRADE CHOLANGIOPANCREATOGRAPHY (ERCP);  Surgeon: Irene Shipper, MD;  [facility];  Service: Endoscopy;  Laterality: N/A;  ? ESOPHAGOGASTRODUODENOSCOPY  last one 01-06-2011  ? INGUINAL HERNIA REPAIR Right 1990's  ? MELANOMA EXCISION N/A 12/05/2019  ? Procedure: WIDE LOCAL EXCISION MELANOMA EXCISION OF SCALP;  Surgeon: Stark Klein, MD;  Location: [place];  Service: General;  Laterality: N/A;  ? PENILE BIOPSY N/A 03/12/2017  ? Procedure: EXCISIONAL GLANS PENILE BIOPSY;  Surgeon: Festus Aloe, MD;  Location[facility];  Service: Urology;  Laterality: N/A;  ? PROSTATE BIOPSY  11/2011  ? normal  ? REMOVAL OF STONES  02/26/2020  ? Procedure: REMOVAL OF STONES;  Surgeon: Irene Shipper, MD;  [facility];  Service: Endoscopy;;  ? SKIN FULL THICKNESS GRAFT Left 12/05/2019  ? Procedure: SKIN GRAFT FULL THICKNESS FROM LEFT UPPER CHEST TO SCALP;  Surgeon: Stark Klein, MD;  Location: [place];  Service: General;  Laterality: Left;  ? SPHINCTEROTOMY  02/26/2020  ? Procedure: SPHINCTEROTOMY;  Surgeon: Irene Shipper, MD;  [facility];  Service: Endoscopy;;  ? TONSILLECTOMY  child  ?   ? ? ?Current Outpatient Medications:  ?  omeprazole (PRILOSEC) 40 MG capsule, Take 1 capsule (40 mg total) by mouth daily., Disp: 90 capsule, Rfl: 1 ?  COVID-19 mRNA bivalent vaccine, Pfizer, injection, Inject into the muscle. (Patient not taking: Reported on 02/20/2022), Disp: 0.3 mL, Rfl: 0 ? ? ? ?Physical Exam: ?Blood pressure 122/68, pulse 75, height '5\' 10"'$  (1.778 m), weight 178 lb (80.7 kg), SpO2 100 %.   ? ?Affect appropriate ?Elderly male  ?HEENT: normal ?Neck supple with no adenopathy ?JVP normal no bruits no thyromegaly ?Lungs clear  ?Heart:  S1/S2 no murmur, no rub, gallop or  click ?PMI normal ?Abdomen: benighn, BS positve, no tenderness, no AAA ?no bruit.  No HSM or HJR ?Distal pulses intact with no bruits ?No edema ?Neuro non-focal ?Skin Eschar on top of head from melanoma  ?No muscular weakness ? ? ?Labs: ?  ?Lab Results  ?Component Value Date  ? WBC 4.6 08/01/2021  ? HGB 12.7 (L) 08/01/2021  ? HCT 37.9 (L) 08/01/2021  ? MCV 98.1 08/01/2021  ? PLT 204.0 08/01/2021  ?  ?No results for input(s): NA, K, CL, CO2, BUN, CREATININE, CALCIUM, PROT, BILITOT, ALKPHOS, ALT, AST, GLUCOSE in the last 168 hours. ? ?Invalid input(s): LABALBU ? ?Lab Results  ?Component Value Date  ? CKTOTAL 73 05/09/2011  ? CKMB 2.1 05/09/2011  ? TROPONINI <0.30 05/09/2011  ?  ?Lab Results  ?Component Value Date  ? CHOL 190 02/25/2020  ? CHOL 178 01/22/2015  ? CHOL 184 08/09/2013  ? ?Lab Results  ?Component Value Date  ? HDL 69 02/25/2020  ? HDL 45.30 01/22/2015  ? HDL 52 08/09/2013  ? ?Lab Results  ?Component Value Date  ? LDLCALC 111 (H) 02/25/2020  ? LDLCALC 117 (H) 01/22/2015  ? LDLCALC 120 (H) 08/09/2013  ? ?Lab Results  ?Component Value Date  ? TRIG 49 02/25/2020  ? TRIG 77.0 01/22/2015  ? TRIG 58 08/09/2013  ? ?Lab Results  ?Component Value Date  ? CHOLHDL 2.8 02/25/2020  ? CHOLHDL 4 01/22/2015  ? CHOLHDL 3.5 08/09/2013  ? ?Lab Results  ?Component Value Date  ? LDLDIRECT 133.3 01/11/2007  ?  ?  ?Radiology: ?No results  found. ? ?EKG: SR RSR' rate 78 02/27/20  ? ? ?ASSESSMENT AND PLAN:  ? ?CAD: sub clinical seen on CT chest ECG benign no chest pain with ADl's  No indication for further ischemic evaluation in 81 yo with no angina  ?UIP/Asbestosi

## 2022-02-20 ENCOUNTER — Ambulatory Visit: Payer: Medicare Other | Admitting: Cardiovascular Disease

## 2022-02-20 ENCOUNTER — Encounter: Payer: Self-pay | Admitting: Cardiovascular Disease

## 2022-02-20 VITALS — BP 122/68 | HR 75 | Ht 70.0 in | Wt 178.0 lb

## 2022-02-20 DIAGNOSIS — J61 Pneumoconiosis due to asbestos and other mineral fibers: Secondary | ICD-10-CM

## 2022-02-20 DIAGNOSIS — K2100 Gastro-esophageal reflux disease with esophagitis, without bleeding: Secondary | ICD-10-CM | POA: Diagnosis not present

## 2022-02-20 DIAGNOSIS — R9431 Abnormal electrocardiogram [ECG] [EKG]: Secondary | ICD-10-CM

## 2022-02-20 DIAGNOSIS — I2510 Atherosclerotic heart disease of native coronary artery without angina pectoris: Secondary | ICD-10-CM

## 2022-02-20 NOTE — Patient Instructions (Signed)
Medication Instructions:  ?Your physician recommends that you continue on your current medications as directed. Please refer to the Current Medication list given to you today. ? ?*If you need a refill on your cardiac medications before your next appointment, please call your pharmacy* ? ?Lab Work: ?If you have labs (blood work) drawn today and your tests are completely normal, you will receive your results only by: ?MyChart Message (if you have MyChart) OR ?A paper copy in the mail ?If you have any lab test that is abnormal or we need to change your treatment, we will call you to review the results. ? ?Testing/Procedures: ?None ordered today. ? ?Follow-Up: ?At [facility], you and your health needs are our priority.  As part of our continuing mission to provide you with exceptional heart care, we have created designated Provider Care Teams.  These Care Teams include your primary Cardiologist (physician) and Advanced Practice Providers (APPs -  Physician Assistants and Nurse Practitioners) who all work together to provide you with the care you need, when you need it. ? ?We recommend signing up for the patient portal called "MyChart".  Sign up information is provided on this After Visit Summary.  MyChart is used to connect with patients for Virtual Visits (Telemedicine).  Patients are able to view lab/test results, encounter notes, upcoming appointments, etc.  Non-urgent messages can be sent to your provider as well.   ?To learn more about what you can do with MyChart, go to [email or web address].   ? ?Your next appointment:   ?As needed ? ?The format for your next appointment:   ?In Person ? ?Provider:   ?Jenkins Rouge, MD { ? ? ?Important Information About Sugar ? ? ? ? ?  ?

## 2022-05-26 DIAGNOSIS — H905 Unspecified sensorineural hearing loss: Secondary | ICD-10-CM | POA: Diagnosis not present

## 2022-05-27 DIAGNOSIS — L578 Other skin changes due to chronic exposure to nonionizing radiation: Secondary | ICD-10-CM | POA: Diagnosis not present

## 2022-05-27 DIAGNOSIS — L570 Actinic keratosis: Secondary | ICD-10-CM | POA: Diagnosis not present

## 2022-05-27 DIAGNOSIS — Z859 Personal history of malignant neoplasm, unspecified: Secondary | ICD-10-CM | POA: Diagnosis not present

## 2022-05-27 DIAGNOSIS — Z85820 Personal history of malignant melanoma of skin: Secondary | ICD-10-CM | POA: Diagnosis not present

## 2022-05-27 DIAGNOSIS — L821 Other seborrheic keratosis: Secondary | ICD-10-CM | POA: Diagnosis not present

## 2022-06-08 DIAGNOSIS — C434 Malignant melanoma of scalp and neck: Secondary | ICD-10-CM | POA: Diagnosis not present

## 2022-07-08 ENCOUNTER — Ambulatory Visit (INDEPENDENT_AMBULATORY_CARE_PROVIDER_SITE_OTHER): Payer: Medicare Other | Admitting: *Deleted

## 2022-07-08 VITALS — BP 137/68 | HR 75 | Ht 70.0 in | Wt 178.4 lb

## 2022-07-08 DIAGNOSIS — Z0000 Encounter for general adult medical examination without abnormal findings: Secondary | ICD-10-CM | POA: Diagnosis not present

## 2022-07-08 NOTE — Progress Notes (Signed)
Subjective:   Miguel Thomas is a 81 y.o. male who presents for Medicare Annual/Subsequent preventive examination.  Review of Systems    Defer to PCP Cardiac Risk Factors include: advanced age (>55mn, >>41women);male gender     Objective:    Today's Vitals   07/08/22 0814  BP: 137/68  Pulse: 75  Weight: 178 lb 6.4 oz (80.9 kg)  Height: '5\' 10"'$  (1.778 m)   Body mass index is 25.6 kg/m.     07/08/2022    8:21 AM 07/01/2021    8:22 AM 03/05/2020    9:40 AM 02/24/2020    4:35 PM 02/24/2020    9:29 AM 12/05/2019    7:50 AM 11/29/2019   11:57 AM  Advanced Directives  Does Patient Have a Medical Advance Directive? No No No No No No No  Would patient like information on creating a medical advance directive? No - Patient declined No - Patient declined No - Patient declined No - Patient declined No - Patient declined No - Patient declined No - Patient declined    Current Medications (verified) Outpatient Encounter Medications as of 07/08/2022  Medication Sig   omeprazole (PRILOSEC) 40 MG capsule Take 1 capsule (40 mg total) by mouth daily.   [DISCONTINUED] COVID-19 mRNA bivalent vaccine, Pfizer, injection Inject into the muscle. (Patient not taking: Reported on 02/20/2022)   No facility-administered encounter medications on file as of 07/08/2022.    Allergies (verified) Patient has no known allergies.   History: Past Medical History:  Diagnosis Date   Anemia    [place]   melanoma on scalp   Dyspnea    had positive covid test 11-04-19   GERD (gastroesophageal reflux disease) 12/12/2004   ring like esophageal stricture with reflux esophagitis, also in 2011   Hiatal hernia    History of adenomatous polyp of colon    tubular adenom's in 2006;  2011; 03-05-2015   History of esophageal stricture    s/p  dilatation 2006  and 2012   Penile ulcer    Plantar fasciitis    Prostate nodule    Pseudophakia of both eyes    Wears hearing aid in both ears    Past Surgical  History:  Procedure Laterality Date   APPLICATION OF A-CELL OF EXTREMITY N/A 12/05/2019   Procedure: APPLICATION OF A-CELL;  Surgeon: BStark Klein MD;  [facility]  Service: General;  Laterality: N/A;   CARDIOVASCULAR STRESS TEST  06/11/2011   normal nuclear study w/ no ischemia/  normal LV function and wall motion, ef 58%   CATARACT EXTRACTION W/ INTRAOCULAR LENS  IMPLANT, BILATERAL  2008 approx.   CHOLECYSTECTOMY N/A 02/27/2020   Procedure: LAPAROSCOPIC CHOLECYSTECTOMY;  Surgeon: GArmandina Gemma MD;  [facility];  Service: General;  Laterality: N/A;   COLONOSCOPY  last one 03-05-2015   ERCP N/A 02/26/2020   Procedure: ENDOSCOPIC RETROGRADE CHOLANGIOPANCREATOGRAPHY (ERCP);  Surgeon: PIrene Shipper MD;  [facility];  Service: Endoscopy;  Laterality: N/A;   ESOPHAGOGASTRODUODENOSCOPY  last one 01-06-2011   INGUINAL HERNIA REPAIR Right 1990's   MELANOMA EXCISION N/A 12/05/2019   Procedure: WIDE LOCAL EXCISION MELANOMA EXCISION OF SCALP;  Surgeon: BStark Klein MD;  [place]  Service: General;  Laterality: N/A;   PENILE BIOPSY N/A 03/12/2017   Procedure: EXCISIONAL GLANS PENILE BIOPSY;  Surgeon: EFestus Aloe MD;  [facility]  Service: Urology;  Laterality: N/A;   PROSTATE BIOPSY  11/2011  normal   REMOVAL OF STONES  02/26/2020   Procedure: REMOVAL OF STONES;  Surgeon: Irene Shipper, MD;  [facility];  Service: Endoscopy;;   SKIN FULL THICKNESS GRAFT Left 12/05/2019   Procedure: SKIN GRAFT FULL THICKNESS FROM LEFT UPPER CHEST TO SCALP;  Surgeon: Stark Klein, MD;  Location: [place];  Service: General;  Laterality: Left;   SPHINCTEROTOMY  02/26/2020   Procedure: SPHINCTEROTOMY;  Surgeon: Irene Shipper, MD;  [facility];  Service: Endoscopy;;   TONSILLECTOMY  child   Family History  Problem Relation Age of Onset   Coronary artery disease Other    Heart disease  Other        rheumatic   Colon cancer Neg Hx    Esophageal cancer Neg Hx    Rectal cancer Neg Hx    Stomach cancer Neg Hx    Social History   Socioeconomic History   Marital status: Widowed    Spouse name: Not on file   Number of children: 1   Years of education: Not on file   Highest education level: Not on file  Occupational History   Occupation: retired  Tobacco Use   Smoking status: Never   Smokeless tobacco: Never  Substance and Sexual Activity   Alcohol use: No    Alcohol/week: 0.0 standard drinks of alcohol   Drug use: No   Sexual activity: Not Currently  Other Topics Concern   Not on file  Social History Narrative   Retired   Married (2nd marriage)   Daily Caffeine- use up to [employer] daily         Social Determinants of Health   Financial Resource Strain: [place]  (07/01/2021)   Overall Financial Resource Strain (CARDIA)    Difficulty of Paying Living Expenses: Not hard at all  Food Insecurity: No [place] (07/01/2021)   Hunger Vital Sign    Worried About Running Out of Food in the Last Year: Never true    [place] in the Last Year: Never true  Transportation Needs: No Transportation Needs (07/01/2021)   PRAPARE - Hydrologist (Medical): No    Lack of Transportation (Non-Medical): No  Physical Activity: Inactive (07/01/2021)   Exercise Vital Sign    Days of Exercise per Week: 0 days    Minutes of Exercise per Session: 0 min  Stress: No Stress Concern Present (07/01/2021)   [place]    Feeling of Stress : Not at all  Social Connections: Moderately Isolated (07/01/2021)   Social Connection and Isolation Panel [NHANES]    Frequency of Communication with Friends and Family: More than three times a week    Frequency of Social Gatherings with Friends and Family: More than three times a week    Attends Religious Services: More than 4 times per year    Active  Member of [employer] or Organizations: No    Attends Archivist Meetings: Never    Marital Status: Widowed    Tobacco Counseling Counseling given: Not Answered   Clinical Intake:  Pre-visit preparation completed: Yes  Pain : No/denies pain     Diabetes: No  How often do you need to have someone help you when you read instructions, pamphlets, or other written materials from your doctor or pharmacy?: 1 - Never  Diabetic? No  Interpreter Needed?: No  Information entered by :: Beatris Ship, [place]   Activities  of Daily Living    07/08/2022    8:24 AM  In your present state of health, do you have any difficulty performing the following activities:  Hearing? 1  Comment wears hearing aids  Vision? 0  Difficulty concentrating or making decisions? 0  Walking or climbing stairs? 0  Dressing or bathing? 0  Doing errands, shopping? 0  Preparing Food and eating ? N  Using the Toilet? N  In the past six months, have you accidently leaked urine? N  Do you have problems with loss of bowel control? N  Managing your Medications? N  Managing your Finances? N  Housekeeping or managing your Housekeeping? N    Patient Care Team: [name], NP as PCP - General (Internal Medicine) Josue Hector, MD as PCP - Cardiology (Cardiology) Gatha Mayer, MD as Consulting Physician (Gastroenterology) Christy Sartorius, MD as Referring Physician (Urology) Stark Klein, MD as Consulting Physician (General Surgery) Druscilla Brownie, MD as Referring Physician (Dermatology) [name], MD as Consulting Physician (General Surgery) [place], [place] any recent Medical Services you may have received from other than [facility] providers in the past year (date may be approximate).     Assessment:   This is a routine wellness examination for [name].  Hearing/Vision screen No results found.  Dietary issues and exercise activities discussed: Current Exercise Habits: Home  exercise routine, Time (Minutes): > 60, Frequency (Times/Week): 1, Weekly Exercise (Minutes/Week): 0, Intensity: Mild, Exercise limited by: None identified   Goals Addressed             This Visit's Progress    Patient Stated   On track    Maintain current health       Depression Screen    07/08/2022    8:23 AM 07/01/2021    8:25 AM 03/05/2020    9:48 AM 10/13/2019   10:38 AM 04/14/2018   10:12 AM 04/20/2017   11:40 AM 01/24/2016    9:27 AM  PHQ 2/9 Scores  PHQ - 2 Score 0 0 0 0 0 1 0  PHQ- 9 Score      6     Fall Risk    07/08/2022    8:23 AM 07/01/2021    8:24 AM 03/05/2020    9:48 AM 10/13/2019   10:42 AM 06/01/2019    1:44 PM  Fall Risk   Falls in the past year? 0 0 0 0   Comment     Emmi Telephone Survey: data to providers prior to load  Number falls in past yr: 0 0 0 0   Comment     Emmi Telephone Survey Actual Response =   Injury with Fall? 0 0 0 0   Risk for fall due to : No Fall Risks      Follow up Falls evaluation completed Falls prevention discussed Education provided;Falls prevention discussed Falls evaluation completed     FALL RISK PREVENTION PERTAINING TO THE HOME:  Any stairs in or around the home? No  If so, are there any without handrails?  No stairs Home free of loose throw rugs in walkways, pet beds, electrical cords, etc? Yes  Adequate lighting in your home to reduce risk of falls? Yes   ASSISTIVE DEVICES UTILIZED TO PREVENT FALLS:  Life alert? No  Use of a cane, walker or w/c? No  Grab bars in the bathroom? No  Shower chair or bench in shower? No  Elevated toilet seat or a handicapped toilet? Yes  TIMED UP AND GO:  Was the test performed? Yes .  Length of time to ambulate 10 feet: 10 sec.   Gait steady and fast without use of assistive device  Cognitive Function:    04/14/2018   10:14 AM  MMSE - Mini Mental State Exam  Orientation to time 5  Orientation to Place 5  Registration 3  Attention/ Calculation 5  Recall 1  Language-  name 2 objects 2  Language- repeat 1  Language- follow 3 step command 3  Language- read & follow direction 1  Write a sentence 1  Copy design 1  Total score 28        Immunizations Immunization History  Administered Date(s) Administered   Fluad Quad(high Dose 65+) 07/31/2020, 07/09/2021   Influenza Whole 08/22/2007, 07/13/2009, 07/19/2010   Influenza, High Dose Seasonal PF 07/23/2015, 08/11/2016, 08/19/2018   Influenza-Unspecified 08/02/2014, 08/03/2019   PFIZER(Purple Top)SARS-COV-2 Vaccination 02/10/2020, 03/11/2020, 10/05/2020   Pfizer Covid-19 Vaccine Bivalent Booster 4yr & up 08/01/2021   Pneumococcal Conjugate-13 01/22/2015   Pneumococcal Polysaccharide-23 02/14/2007   Td 05/19/2004, 01/22/2015   Zoster Recombinat (Shingrix) 08/21/2020, 12/09/2020   Zoster, Live 11/02/2009    TDAP status: Up to date  Flu Vaccine status: Up to date  Pneumococcal vaccine status: Up to date  Covid-19 vaccine status: Information provided on how to obtain vaccines.   Qualifies for Shingles Vaccine? Yes   Zostavax completed No   Shingrix Completed?: Yes  Screening Tests Health Maintenance  Topic Date Due   URINE MICROALBUMIN  Never done   COVID-19 Vaccine (5 - Pfizer risk series) 09/26/2021   INFLUENZA VACCINE  06/02/2022   TETANUS/TDAP  01/21/2025   Pneumonia Vaccine 81 Years old  Completed   Zoster Vaccines- Shingrix  Completed   HPV VACCINES  Aged Out    Health Maintenance  Health Maintenance Due  Topic Date Due   URINE MICROALBUMIN  Never done   COVID-19 Vaccine (5 - Pfizer risk series) 09/26/2021   INFLUENZA VACCINE  06/02/2022    Colorectal cancer screening: No longer required.   Lung Cancer Screening: (Low Dose CT Chest recommended if Age 83-80years, 30 pack-year currently smoking OR have quit w/in 15years.) does not qualify.   Lung Cancer Screening Referral: N/a  Additional Screening:  Hepatitis C Screening: does qualify; Completed N/a  Vision  Screening: Recommended annual ophthalmology exams for early detection of glaucoma and other disorders of the eye. Is the patient up to date with their annual eye exam?  Yes  Who is the provider or what is the name of the office in which the patient attends annual eye exams? Dr. MRutherford GuysIf pt is not established with a provider, would they like to be referred to a provider to establish care? No .   Dental Screening: Recommended annual dental exams for proper oral hygiene  Community Resource Referral / Chronic Care Management: CRR required this visit?  No   CCM required this visit?  No      Plan:     I have personally reviewed and noted the following in the patient's chart:   Medical and social history Use of alcohol, tobacco or illicit drugs  Current medications and supplements including opioid prescriptions. Patient is not currently taking opioid prescriptions. Functional ability and status Nutritional status Physical activity Advanced directives List of other physicians Hospitalizations, surgeries, and ER visits in previous 12 months Vitals Screenings to include cognitive, depression, and falls Referrals and appointments  In addition, I have reviewed and  discussed with patient certain preventive protocols, quality metrics, and best practice recommendations. A written personalized care plan for preventive services as well as general preventive health recommendations were provided to patient.     Beatris Ship, [place]   07/08/2022   Nurse Notes: None

## 2022-07-08 NOTE — Patient Instructions (Signed)
[name] , Thank you for taking time to come for your Medicare Wellness Visit. I appreciate your ongoing commitment to your health goals. Please review the following plan we discussed and let me know if I can assist you in the future.   These are the goals we discussed:  Goals      Begin painting artwork again.     Patient Stated     Maintain current health        This is a list of the screening recommended for you and due dates:  Health Maintenance  Topic Date Due   Urine Protein Check  Never done   COVID-19 Vaccine (5 - Pfizer risk series) 09/26/2021   Flu Shot  06/02/2022   Tetanus Vaccine  01/21/2025   Pneumonia Vaccine  Completed   Zoster (Shingles) Vaccine  Completed   HPV Vaccine  Aged Out       Next appointment: Follow up in one year for your annual wellness visit.   Preventive Care 10 Years and Older, Male Preventive care refers to lifestyle choices and visits with your health care provider that can promote health and wellness. What does preventive care include? A yearly physical exam. This is also called an annual well check. Dental exams once or twice a year. Routine eye exams. Ask your health care provider how often you should have your eyes checked. Personal lifestyle choices, including: Daily care of your teeth and gums. Regular physical activity. Eating a healthy diet. Avoiding tobacco and drug use. Limiting alcohol use. Practicing safe sex. Taking low doses of aspirin every day. Taking vitamin and mineral supplements as recommended by your health care provider. What happens during an annual well check? The services and screenings done by your health care provider during your annual well check will depend on your age, overall health, lifestyle risk factors, and family history of disease. Counseling  Your health care provider may ask you questions about your: Alcohol use. Tobacco use. Drug use. Emotional well-being. Home and relationship  well-being. Sexual activity. Eating habits. History of falls. Memory and ability to understand (cognition). Work and work Statistician. Screening  You may have the following tests or measurements: Height, weight, and BMI. Blood pressure. Lipid and cholesterol levels. These may be checked every 5 years, or more frequently if you are over 64 years old. Skin check. Lung cancer screening. You may have this screening every year starting at age 86 if you have a 30-pack-year history of smoking and currently smoke or have quit within the past 15 years. Fecal occult blood test (FOBT) of the stool. You may have this test every year starting at age 21. Flexible sigmoidoscopy or colonoscopy. You may have a sigmoidoscopy every 5 years or a colonoscopy every 10 years starting at age 35. Prostate cancer screening. Recommendations will vary depending on your family history and other risks. Hepatitis C blood test. Hepatitis B blood test. Sexually transmitted disease (STD) testing. Diabetes screening. This is done by checking your blood sugar (glucose) after you have not eaten for a while (fasting). You may have this done every 1-3 years. Abdominal aortic aneurysm (AAA) screening. You may need this if you are a current or former smoker. Osteoporosis. You may be screened starting at age 52 if you are at high risk. Talk with your health care provider about your test results, treatment options, and if necessary, the need for more tests. Vaccines  Your health care provider may recommend certain vaccines, such as: Influenza vaccine. This  is recommended every year. Tetanus, diphtheria, and acellular pertussis (Tdap, Td) vaccine. You may need a Td booster every 10 years. Zoster vaccine. You may need this after age 28. Pneumococcal 13-valent conjugate (PCV13) vaccine. One dose is recommended after age 77. Pneumococcal polysaccharide (PPSV23) vaccine. One dose is recommended after age 41. Talk to your health care  provider about which screenings and vaccines you need and how often you need them. This information is not intended to replace advice given to you by your health care provider. Make sure you discuss any questions you have with your health care provider. Document Released: 11/15/2015 Document Revised: 07/08/2016 Document Reviewed: 08/20/2015 Elsevier Interactive Patient Education  2017 [place] Prevention in the Home Falls can cause injuries. They can happen to people of all ages. There are many things you can do to make your home safe and to help prevent falls. What can I do on the outside of my home? Regularly fix the edges of walkways and driveways and fix any cracks. Remove anything that might make you trip as you walk through a door, such as a raised step or threshold. Trim any bushes or trees on the path to your home. Use bright outdoor lighting. Clear any walking paths of anything that might make someone trip, such as rocks or tools. Regularly check to see if handrails are loose or broken. Make sure that both sides of any steps have handrails. Any raised decks and porches should have guardrails on the edges. Have any leaves, snow, or ice cleared regularly. Use sand or salt on walking paths during winter. Clean up any spills in your garage right away. This includes oil or grease spills. What can I do in the bathroom? Use night lights. Install grab bars by the toilet and in the tub and shower. Do not use towel bars as grab bars. Use non-skid mats or decals in the tub or shower. If you need to sit down in the shower, use a plastic, non-slip stool. Keep the floor dry. Clean up any water that spills on the floor as soon as it happens. Remove soap buildup in the tub or shower regularly. Attach bath mats securely with double-sided non-slip rug tape. Do not have throw rugs and other things on the floor that can make you trip. What can I do in the bedroom? Use night lights. Make  sure that you have a light by your bed that is easy to reach. Do not use any sheets or blankets that are too big for your bed. They should not hang down onto the floor. Have a firm chair that has side arms. You can use this for support while you get dressed. Do not have throw rugs and other things on the floor that can make you trip. What can I do in the kitchen? Clean up any spills right away. Avoid walking on wet floors. Keep items that you use a lot in easy-to-reach places. If you need to reach something above you, use a strong step stool that has a grab bar. Keep electrical cords out of the way. Do not use floor polish or wax that makes floors slippery. If you must use wax, use non-skid floor wax. Do not have throw rugs and other things on the floor that can make you trip. What can I do with my stairs? Do not leave any items on the stairs. Make sure that there are handrails on both sides of the stairs and use them. Fix handrails that  are broken or loose. Make sure that handrails are as long as the stairways. Check any carpeting to make sure that it is firmly attached to the stairs. Fix any carpet that is loose or worn. Avoid having throw rugs at the top or bottom of the stairs. If you do have throw rugs, attach them to the floor with carpet tape. Make sure that you have a light switch at the top of the stairs and the bottom of the stairs. If you do not have them, ask someone to add them for you. What else can I do to help prevent falls? Wear shoes that: Do not have high heels. Have rubber bottoms. Are comfortable and fit you well. Are closed at the toe. Do not wear sandals. If you use a stepladder: Make sure that it is fully opened. Do not climb a closed stepladder. Make sure that both sides of the stepladder are locked into place. Ask someone to hold it for you, if possible. Clearly [place] and make sure that you can see: Any grab bars or handrails. First and last steps. Where the  edge of each step is. Use tools that help you move around (mobility aids) if they are needed. These include: Canes. Walkers. Scooters. Crutches. Turn on the lights when you go into a dark area. Replace any light bulbs as soon as they burn out. Set up your furniture so you have a clear path. Avoid moving your furniture around. If any of your floors are uneven, fix them. If there are any pets around you, be aware of where they are. Review your medicines with your doctor. Some medicines can make you feel dizzy. This can increase your chance of falling. Ask your doctor what other things that you can do to help prevent falls. This information is not intended to replace advice given to you by your health care provider. Make sure you discuss any questions you have with your health care provider. Document Released: 08/15/2009 Document Revised: 03/26/2016 Document Reviewed: 11/23/2014 Elsevier Interactive Patient Education  2017 [employer].

## 2022-07-15 NOTE — Progress Notes (Signed)
Subjective:  **New note with 6CIT pulled in   Miguel Thomas is a 81 y.o. male who presents for Medicare Annual/Subsequent preventive examination.  Review of Systems    Defer to PCP Cardiac Risk Factors include: advanced age (>50mn, >>15women);male gender     Objective:    Today's Vitals   07/08/22 0814  BP: 137/68  Pulse: 75  Weight: 178 lb 6.4 oz (80.9 kg)  Height: '5\' 10"'$  (1.778 m)   Body mass index is 25.6 kg/m.     07/08/2022    8:21 AM 07/01/2021    8:22 AM 03/05/2020    9:40 AM 02/24/2020    4:35 PM 02/24/2020    9:29 AM 12/05/2019    7:50 AM 11/29/2019   11:57 AM  Advanced Directives  Does Patient Have a Medical Advance Directive? No No No No No No No  Would patient like information on creating a medical advance directive? No - Patient declined No - Patient declined No - Patient declined No - Patient declined No - Patient declined No - Patient declined No - Patient declined    Current Medications (verified) Outpatient Encounter Medications as of 07/08/2022  Medication Sig   omeprazole (PRILOSEC) 40 MG capsule Take 1 capsule (40 mg total) by mouth daily.   [DISCONTINUED] COVID-19 mRNA bivalent vaccine, Pfizer, injection Inject into the muscle. (Patient not taking: Reported on 02/20/2022)   No facility-administered encounter medications on file as of 07/08/2022.    Allergies (verified) Patient has no known allergies.   History: Past Medical History:  Diagnosis Date   Anemia    Cancer ([facility] 10/2019   melanoma on scalp   Dyspnea    had positive covid test 11-04-19   GERD (gastroesophageal reflux disease) 12/12/2004   ring like esophageal stricture with reflux esophagitis, also in 2011   Hiatal hernia    History of adenomatous polyp of colon    tubular adenom's in 2006;  2011; 03-05-2015   History of esophageal stricture    s/p  dilatation 2006  and 2012   Penile ulcer    Plantar fasciitis    Prostate nodule    Pseudophakia of both eyes    Wears hearing aid in  both ears    Past Surgical History:  Procedure Laterality Date   APPLICATION OF A-CELL OF EXTREMITY N/A 12/05/2019   Procedure: APPLICATION OF A-CELL;  Surgeon: BStark Klein MD;  [facility]  Service: General;  Laterality: N/A;   CARDIOVASCULAR STRESS TEST  06/11/2011   normal nuclear study w/ no ischemia/  normal LV function and wall motion, ef 58%   CATARACT EXTRACTION W/ INTRAOCULAR LENS  IMPLANT, BILATERAL  2008 approx.   CHOLECYSTECTOMY N/A 02/27/2020   Procedure: LAPAROSCOPIC CHOLECYSTECTOMY;  Surgeon: GArmandina Gemma MD;  [facility];  Service: General;  Laterality: N/A;   COLONOSCOPY  last one 03-05-2015   ERCP N/A 02/26/2020   Procedure: ENDOSCOPIC RETROGRADE CHOLANGIOPANCREATOGRAPHY (ERCP);  Surgeon: PIrene Shipper MD;  [facility];  Service: Endoscopy;  Laterality: N/A;   ESOPHAGOGASTRODUODENOSCOPY  last one 01-06-2011   INGUINAL HERNIA REPAIR Right 1990's   MELANOMA EXCISION N/A 12/05/2019   Procedure: WIDE LOCAL EXCISION MELANOMA EXCISION OF SCALP;  Surgeon: BStark Klein MD;  [facility]  Service: General;  Laterality: N/A;   PENILE BIOPSY N/A 03/12/2017   Procedure: EXCISIONAL GLANS PENILE BIOPSY;  Surgeon: EFestus Aloe MD;  [facility]  Service: Urology;  Laterality: N/A;  PROSTATE BIOPSY  11/2011   normal   REMOVAL OF STONES  02/26/2020   Procedure: REMOVAL OF STONES;  Surgeon: Irene Shipper, MD;  [facility];  Service: Endoscopy;;   SKIN FULL THICKNESS GRAFT Left 12/05/2019   Procedure: SKIN GRAFT FULL THICKNESS FROM LEFT UPPER CHEST TO SCALP;  Surgeon: Stark Klein, MD;  Location: [place];  Service: General;  Laterality: Left;   SPHINCTEROTOMY  02/26/2020   Procedure: SPHINCTEROTOMY;  Surgeon: Irene Shipper, MD;  [facility];  Service: Endoscopy;;   TONSILLECTOMY  child   Family History  Problem Relation Age of Onset   Coronary artery  disease Other    Heart disease Other        rheumatic   Colon cancer Neg Hx    Esophageal cancer Neg Hx    Rectal cancer Neg Hx    Stomach cancer Neg Hx    Social History   Socioeconomic History   Marital status: Widowed    Spouse name: Not on file   Number of children: 1   Years of education: Not on file   Highest education level: Not on file  Occupational History   Occupation: retired  Tobacco Use   Smoking status: Never   Smokeless tobacco: Never  Substance and Sexual Activity   Alcohol use: No    Alcohol/week: 0.0 standard drinks of alcohol   Drug use: No   Sexual activity: Not Currently  Other Topics Concern   Not on file  Social History Narrative   Retired   Married (2nd marriage)   Daily Caffeine- use up to [employer] daily         Social Determinants of Health   Financial Resource Strain: [place]  (07/01/2021)   Overall Financial Resource Strain (CARDIA)    Difficulty of Paying Living Expenses: Not hard at all  Food Insecurity: No [place] (07/01/2021)   Hunger Vital Sign    Worried About Running Out of Food in the Last Year: Never true    [place] in the Last Year: Never true  Transportation Needs: No Transportation Needs (07/01/2021)   PRAPARE - Hydrologist (Medical): No    Lack of Transportation (Non-Medical): No  Physical Activity: Inactive (07/01/2021)   Exercise Vital Sign    Days of Exercise per Week: 0 days    Minutes of Exercise per Session: 0 min  Stress: No Stress Concern Present (07/01/2021)   [place]    Feeling of Stress : Not at all  Social Connections: Moderately Isolated (07/01/2021)   Social Connection and Isolation Panel [NHANES]    Frequency of Communication with Friends and Family: More than three times a week    Frequency of Social Gatherings with Friends and Family: More than three times a week    Attends Religious Services: More  than 4 times per year    Active Member of [employer] or Organizations: No    Attends Archivist Meetings: Never    Marital Status: Widowed    Tobacco Counseling Counseling given: Not Answered   Clinical Intake:  Pre-visit preparation completed: Yes  Pain : No/denies pain     Diabetes: No  How often do you need to have someone help you when you read instructions, pamphlets, or other written materials from your doctor or pharmacy?: 1 - Never  Diabetic? No  Interpreter Needed?: No  Information entered by ::  Beatris Ship, [place]   Activities of Daily Living    07/08/2022    8:24 AM  In your present state of health, do you have any difficulty performing the following activities:  Hearing? 1  Comment wears hearing aids  Vision? 0  Difficulty concentrating or making decisions? 0  Walking or climbing stairs? 0  Dressing or bathing? 0  Doing errands, shopping? 0  Preparing Food and eating ? N  Using the Toilet? N  In the past six months, have you accidently leaked urine? N  Do you have problems with loss of bowel control? N  Managing your Medications? N  Managing your Finances? N  Housekeeping or managing your Housekeeping? N    Patient Care Team: [name], NP as PCP - General (Internal Medicine) Josue Hector, MD as PCP - Cardiology (Cardiology) Gatha Mayer, MD as Consulting Physician (Gastroenterology) Christy Sartorius, MD as Referring Physician (Urology) Stark Klein, MD as Consulting Physician (General Surgery) Druscilla Brownie, MD as Referring Physician (Dermatology) [name], MD as Consulting Physician (General Surgery) [place], [place] any recent Medical Services you may have received from other than [facility] providers in the past year (date may be approximate).     Assessment:   This is a routine wellness examination for [name].  Hearing/Vision screen No results found.  Dietary issues and exercise activities  discussed: Current Exercise Habits: Home exercise routine, Time (Minutes): > 60, Frequency (Times/Week): 1, Weekly Exercise (Minutes/Week): 0, Intensity: Mild, Exercise limited by: None identified   Goals Addressed             This Visit's Progress    Patient Stated   On track    Maintain current health      Depression Screen    07/08/2022    8:23 AM 07/01/2021    8:25 AM 03/05/2020    9:48 AM 10/13/2019   10:38 AM 04/14/2018   10:12 AM 04/20/2017   11:40 AM 01/24/2016    9:27 AM  PHQ 2/9 Scores  PHQ - 2 Score 0 0 0 0 0 1 0  PHQ- 9 Score      6     Fall Risk    07/08/2022    8:23 AM 07/01/2021    8:24 AM 03/05/2020    9:48 AM 10/13/2019   10:42 AM 06/01/2019    1:44 PM  Fall Risk   Falls in the past year? 0 0 0 0   Comment     Emmi Telephone Survey: data to providers prior to load  Number falls in past yr: 0 0 0 0   Comment     Emmi Telephone Survey Actual Response =   Injury with Fall? 0 0 0 0   Risk for fall due to : No Fall Risks      Follow up Falls evaluation completed Falls prevention discussed Education provided;Falls prevention discussed Falls evaluation completed     FALL RISK PREVENTION PERTAINING TO THE HOME:  Any stairs in or around the home? No  If so, are there any without handrails?  No stairs Home free of loose throw rugs in walkways, pet beds, electrical cords, etc? Yes  Adequate lighting in your home to reduce risk of falls? Yes   ASSISTIVE DEVICES UTILIZED TO PREVENT FALLS:  Life alert? No  Use of a cane, walker or w/c? No  Grab bars in the bathroom? No  Shower chair or bench in shower? No  Elevated toilet seat  or a handicapped toilet? Yes   TIMED UP AND GO:  Was the test performed? Yes .  Length of time to ambulate 10 feet: 10 sec.   Gait steady and fast without use of assistive device  Cognitive Function:    04/14/2018   10:14 AM  MMSE - Mini Mental State Exam  Orientation to time 5  Orientation to Place 5  Registration 3  Attention/  Calculation 5  Recall 1  Language- name 2 objects 2  Language- repeat 1  Language- follow 3 step command 3  Language- read & follow direction 1  Write a sentence 1  Copy design 1  Total score 28        07/08/2022    8:29 AM  6CIT Screen  What Year? 0 points  What month? 0 points  What time? 0 points  Count back from 20 0 points  Months in reverse 0 points  Repeat phrase 8 points  Total Score 8 points   Immunizations Immunization History  Administered Date(s) Administered   Fluad Quad(high Dose 65+) 07/31/2020, 07/09/2021   Influenza Whole 08/22/2007, 07/13/2009, 07/19/2010   Influenza, High Dose Seasonal PF 07/23/2015, 08/11/2016, 08/19/2018   Influenza-Unspecified 08/02/2014, 08/03/2019   PFIZER(Purple Top)SARS-COV-2 Vaccination 02/10/2020, 03/11/2020, 10/05/2020   Pfizer Covid-19 Vaccine Bivalent Booster 38yr & up 08/01/2021   Pneumococcal Conjugate-13 01/22/2015   Pneumococcal Polysaccharide-23 02/14/2007   Td 05/19/2004, 01/22/2015   Zoster Recombinat (Shingrix) 08/21/2020, 12/09/2020   Zoster, Live 11/02/2009    TDAP status: Up to date  Flu Vaccine status: Up to date  Pneumococcal vaccine status: Up to date  Covid-19 vaccine status: Information provided on how to obtain vaccines.   Qualifies for Shingles Vaccine? Yes   Zostavax completed No   Shingrix Completed?: Yes  Screening Tests Health Maintenance  Topic Date Due   URINE MICROALBUMIN  Never done   COVID-19 Vaccine (5 - Pfizer risk series) 09/26/2021   INFLUENZA VACCINE  06/02/2022   TETANUS/TDAP  01/21/2025   Pneumonia Vaccine 81 Years old  Completed   Zoster Vaccines- Shingrix  Completed   HPV VACCINES  Aged Out    Health Maintenance  Health Maintenance Due  Topic Date Due   URINE MICROALBUMIN  Never done   COVID-19 Vaccine (5 - Pfizer risk series) 09/26/2021   INFLUENZA VACCINE  06/02/2022    Colorectal cancer screening: No longer required.   Lung Cancer Screening: (Low Dose CT  Chest recommended if Age 83-80years, 30 pack-year currently smoking OR have quit w/in 15years.) does not qualify.   Lung Cancer Screening Referral: N/a  Additional Screening:  Hepatitis C Screening: does qualify; Completed N/a  Vision Screening: Recommended annual ophthalmology exams for early detection of glaucoma and other disorders of the eye. Is the patient up to date with their annual eye exam?  Yes  Who is the provider or what is the name of the office in which the patient attends annual eye exams? Dr. MRutherford GuysIf pt is not established with a provider, would they like to be referred to a provider to establish care? No .   Dental Screening: Recommended annual dental exams for proper oral hygiene  Community Resource Referral / Chronic Care Management: CRR required this visit?  No   CCM required this visit?  No      Plan:     I have personally reviewed and noted the following in the patient's chart:   Medical and social history Use of alcohol, tobacco or illicit drugs  Current medications and supplements including opioid prescriptions. Patient is not currently taking opioid prescriptions. Functional ability and status Nutritional status Physical activity Advanced directives List of other physicians Hospitalizations, surgeries, and ER visits in previous 12 months Vitals Screenings to include cognitive, depression, and falls Referrals and appointments  In addition, I have reviewed and discussed with patient certain preventive protocols, quality metrics, and best practice recommendations. A written personalized care plan for preventive services as well as general preventive health recommendations were provided to patient.     Beatris Ship, [place]   07/15/2022   Nurse Notes: None

## 2022-08-12 ENCOUNTER — Ambulatory Visit (INDEPENDENT_AMBULATORY_CARE_PROVIDER_SITE_OTHER): Payer: Medicare Other | Admitting: Family

## 2022-08-12 ENCOUNTER — Encounter: Payer: Self-pay | Admitting: Family

## 2022-08-12 ENCOUNTER — Ambulatory Visit: Payer: Medicare Other | Admitting: Family

## 2022-08-12 VITALS — BP 135/70 | HR 65 | Temp 97.9°F | Resp 16 | Wt 175.0 lb

## 2022-08-12 DIAGNOSIS — K2100 Gastro-esophageal reflux disease with esophagitis, without bleeding: Secondary | ICD-10-CM | POA: Diagnosis not present

## 2022-08-12 DIAGNOSIS — I2510 Atherosclerotic heart disease of native coronary artery without angina pectoris: Secondary | ICD-10-CM

## 2022-08-12 DIAGNOSIS — M8580 Other specified disorders of bone density and structure, unspecified site: Secondary | ICD-10-CM | POA: Diagnosis not present

## 2022-08-12 DIAGNOSIS — J61 Pneumoconiosis due to asbestos and other mineral fibers: Secondary | ICD-10-CM

## 2022-08-12 DIAGNOSIS — R195 Other fecal abnormalities: Secondary | ICD-10-CM | POA: Diagnosis not present

## 2022-08-12 DIAGNOSIS — Z23 Encounter for immunization: Secondary | ICD-10-CM

## 2022-08-12 MED ORDER — OMEPRAZOLE 40 MG PO CPDR: 40.0000 mg | DELAYED_RELEASE_CAPSULE | Freq: Every day | ORAL | 4 refills | Status: DC

## 2022-08-12 NOTE — Assessment & Plan Note (Signed)
Patient is followed by Dr. Erin Fulling.

## 2022-08-12 NOTE — Progress Notes (Signed)
Subjective:     Patient ID: Miguel Thomas, male    DOB: 1941/02/18, 81 y.o.   MRN: [id number]  Chief Complaint  Patient presents with   Gastroesophageal Reflux    Doing well on medication   Diarrhea    Complains of occasional loose stools.     HPI Patient is in today for follow up.  GERD- maintained on omeprazole once daily. Reports well controlled on this medication.   Diarrhea- reports occasional loose stools.  Notes cereal will sometimes cause diarrhea.    Would like flu shot today.   Health Maintenance Due  Topic Date Due   COVID-19 Vaccine (5 - Pfizer risk series) 09/26/2021   INFLUENZA VACCINE  06/02/2022    Past Medical History:  Diagnosis Date   Anemia    Cancer ([facility]) 10/2019   melanoma on scalp   Dyspnea    had positive covid test 11-04-19   GERD (gastroesophageal reflux disease) 12/12/2004   ring like esophageal stricture with reflux esophagitis, also in 2011   Hiatal hernia    History of adenomatous polyp of colon    tubular adenom's in 2006;  2011; 03-05-2015   History of esophageal stricture    s/p  dilatation 2006  and 2012   Penile ulcer    Plantar fasciitis    Plantar fasciitis, left 05/18/2016   Prostate nodule    Pseudophakia of both eyes    Wears hearing aid in both ears     Past Surgical History:  Procedure Laterality Date   APPLICATION OF A-CELL OF EXTREMITY N/A 12/05/2019   Procedure: APPLICATION OF A-CELL;  Surgeon: Stark Klein, MD;  Location: [place];  Service: General;  Laterality: N/A;   CARDIOVASCULAR STRESS TEST  06/11/2011   normal nuclear study w/ no ischemia/  normal LV function and wall motion, ef 58%   CATARACT EXTRACTION W/ INTRAOCULAR LENS  IMPLANT, BILATERAL  2008 approx.   CHOLECYSTECTOMY N/A 02/27/2020   Procedure: LAPAROSCOPIC CHOLECYSTECTOMY;  Surgeon: [name], MD;  Location: [facility];  Service: General;  Laterality: N/A;   COLONOSCOPY  last one 03-05-2015   ERCP N/A 02/26/2020   Procedure: ENDOSCOPIC  RETROGRADE CHOLANGIOPANCREATOGRAPHY (ERCP);  Surgeon: Irene Shipper, MD;  Location: [facility];  Service: Endoscopy;  Laterality: N/A;   ESOPHAGOGASTRODUODENOSCOPY  last one 01-06-2011   INGUINAL HERNIA REPAIR Right 1990's   MELANOMA EXCISION N/A 12/05/2019   Procedure: WIDE LOCAL EXCISION MELANOMA EXCISION OF SCALP;  Surgeon: Stark Klein, MD;  Location: [place];  Service: General;  Laterality: N/A;   PENILE BIOPSY N/A 03/12/2017   Procedure: EXCISIONAL GLANS PENILE BIOPSY;  Surgeon: Festus Aloe, MD;  Location: [facility];  Service: Urology;  Laterality: N/A;   PROSTATE BIOPSY  11/2011   normal   REMOVAL OF STONES  02/26/2020   Procedure: REMOVAL OF STONES;  Surgeon: Irene Shipper, MD;  Location: [facility];  Service: Endoscopy;;   SKIN FULL THICKNESS GRAFT Left 12/05/2019   Procedure: SKIN GRAFT FULL THICKNESS FROM LEFT UPPER CHEST TO SCALP;  Surgeon: Stark Klein, MD;  Location: [place];  Service: General;  Laterality: Left;   SPHINCTEROTOMY  02/26/2020   Procedure: SPHINCTEROTOMY;  Surgeon: Irene Shipper, MD;  Location: [facility];  Service: Endoscopy;;   TONSILLECTOMY  child    Family History  Problem Relation Age of Onset   Coronary artery disease Other    Heart disease Other  rheumatic   Colon cancer Neg Hx    Esophageal cancer Neg Hx    Rectal cancer Neg Hx    Stomach cancer Neg Hx     Social History   Socioeconomic History   Marital status: Widowed    Spouse name: Not on file   Number of children: 1   Years of education: Not on file   Highest education level: Not on file  Occupational History   Occupation: retired  Tobacco Use   Smoking status: Never   Smokeless tobacco: Never  Substance and Sexual Activity   Alcohol use: No    Alcohol/week: 0.0 standard drinks of alcohol   Drug use: No   Sexual activity: Not Currently  Other Topics Concern   Not on file  Social History Narrative   Retired    Married (2nd marriage)   Daily Caffeine- use up to [employer] daily         Social Determinants of Health   Financial Resource Strain: [place]  (07/01/2021)   Overall Financial Resource Strain (CARDIA)    Difficulty of Paying Living Expenses: Not hard at all  Food Insecurity: No [place] (07/01/2021)   Hunger Vital Sign    Worried About Running Out of Food in the Last Year: Never true    [place] in the Last Year: Never true  Transportation Needs: No Transportation Needs (07/01/2021)   PRAPARE - Hydrologist (Medical): No    Lack of Transportation (Non-Medical): No  Physical Activity: Inactive (07/01/2021)   Exercise Vital Sign    Days of Exercise per Week: 0 days    Minutes of Exercise per Session: 0 min  Stress: No Stress Concern Present (07/01/2021)   [place]    Feeling of Stress : Not at all  Social Connections: Moderately Isolated (07/01/2021)   Social Connection and Isolation Panel [NHANES]    Frequency of Communication with Friends and Family: More than three times a week    Frequency of Social Gatherings with Friends and Family: More than three times a week    Attends Religious Services: More than 4 times per year    Active Member of [employer] or Organizations: No    Attends Archivist Meetings: Never    Marital Status: Widowed  Intimate Partner Violence: Not At Risk (07/01/2021)   Humiliation, Afraid, Rape, and Kick questionnaire    Fear of Current or Ex-Partner: No    Emotionally Abused: No    Physically Abused: No    Sexually Abused: No    Outpatient Medications Prior to Visit  Medication Sig Dispense Refill   omeprazole (PRILOSEC) 40 MG capsule Take 1 capsule (40 mg total) by mouth daily. 90 capsule 1   No facility-administered medications prior to visit.    No Known Allergies  ROS    See HPI Objective:    Physical Exam Constitutional:       General: He is not in acute distress.    Appearance: He is well-developed.  HENT:     Head: Normocephalic and atraumatic.  Cardiovascular:     Rate and Rhythm: Normal rate and regular rhythm.     Heart sounds: No murmur heard. Pulmonary:     Effort: Pulmonary effort is normal. No respiratory distress.     Breath sounds: Normal breath sounds. No wheezing or rales.  Skin:    General: Skin is warm and dry.  Neurological:  Mental Status: He is alert and oriented to person, place, and time.  Psychiatric:        Behavior: Behavior normal.        Thought Content: Thought content normal.     BP 135/70 (BP Location: Right Arm, Patient Position: Sitting, Cuff Size: Small)   Pulse 65   Temp 97.9 F (36.6 C) (Oral)   Resp 16   Wt 175 lb (79.4 kg)   SpO2 100%   BMI 25.11 kg/m  Wt Readings from Last 3 Encounters:  08/12/22 175 lb (79.4 kg)  07/08/22 178 lb 6.4 oz (80.9 kg)  02/20/22 178 lb (80.7 kg)       Assessment & Plan:   Problem List Items Addressed This Visit       Unprioritized   Osteopenia    Had normal bone density in 2022.       Loose stools    Stable, only having occasional loose stools. ?dietary related.  Continue to monitor.       GERD    Stable on omeprazole. Continue same.       Relevant Medications   omeprazole (PRILOSEC) 40 MG capsule   Coronary artery calcification seen on CT scan    Patient saw cardiology last in 4/23.  No further cardiac work up of follow up was recommended. He continues to stay active and get regular exercise (enjoys mowing the lawn).       Asbestosis [facility])    Patient is followed by Dr. Erin Fulling.       Other Visit Diagnoses     Needs flu shot    -  Primary   Relevant Orders   Flu Vaccine QUAD High Dose(Fluad)       I am having Miguel Thomas maintain his omeprazole.  Meds ordered this encounter  Medications   omeprazole (PRILOSEC) 40 MG capsule    Sig: Take 1 capsule (40 mg total) by mouth daily.    Dispense:  90  capsule    Refill:  4    Order Specific Question:   Supervising Provider    Answer:   Penni Homans A [[id number]]

## 2022-08-12 NOTE — Assessment & Plan Note (Addendum)
Stable, only having occasional loose stools. ?dietary related.  Continue to monitor.

## 2022-08-12 NOTE — Assessment & Plan Note (Signed)
Had normal bone density in 2022.

## 2022-08-12 NOTE — Assessment & Plan Note (Signed)
Patient saw cardiology last in 4/23.  No further cardiac work up of follow up was recommended. He continues to stay active and get regular exercise (enjoys mowing the lawn).

## 2022-08-12 NOTE — Assessment & Plan Note (Signed)
Stable on omeprazole. Continue same.  ?

## 2022-08-24 ENCOUNTER — Telehealth: Payer: Self-pay | Admitting: Family

## 2022-08-24 DIAGNOSIS — R197 Diarrhea, unspecified: Secondary | ICD-10-CM

## 2022-08-24 NOTE — Telephone Encounter (Signed)
Pt called and stated diarrhea has not gotten better and would like to know what pcp wants to do. Please advise.

## 2022-08-24 NOTE — Telephone Encounter (Signed)
Patient advised of provider's recommendations and advised to come in and pick up the stool sample collection bottles.

## 2022-08-24 NOTE — Telephone Encounter (Signed)
He may use OTC imodium prn.  I would like him to complete stool studies as ordered and return at his earliest convenience. Stay well hydrated.

## 2022-08-26 ENCOUNTER — Other Ambulatory Visit: Payer: Medicare Other

## 2022-08-26 DIAGNOSIS — R197 Diarrhea, unspecified: Secondary | ICD-10-CM | POA: Diagnosis not present

## 2022-08-27 LAB — CLOSTRIDIUM DIFFICILE BY PCR: Toxigenic C. Difficile by PCR: NEGATIVE

## 2022-08-30 LAB — STOOL CULTURE: E coli, Shiga toxin Assay: NEGATIVE

## 2022-11-30 DIAGNOSIS — Z859 Personal history of malignant neoplasm, unspecified: Secondary | ICD-10-CM | POA: Diagnosis not present

## 2022-11-30 DIAGNOSIS — L821 Other seborrheic keratosis: Secondary | ICD-10-CM | POA: Diagnosis not present

## 2022-11-30 DIAGNOSIS — L570 Actinic keratosis: Secondary | ICD-10-CM | POA: Diagnosis not present

## 2022-11-30 DIAGNOSIS — D1801 Hemangioma of skin and subcutaneous tissue: Secondary | ICD-10-CM | POA: Diagnosis not present

## 2022-11-30 DIAGNOSIS — Z85820 Personal history of malignant melanoma of skin: Secondary | ICD-10-CM | POA: Diagnosis not present

## 2022-11-30 DIAGNOSIS — L578 Other skin changes due to chronic exposure to nonionizing radiation: Secondary | ICD-10-CM | POA: Diagnosis not present

## 2023-01-14 DIAGNOSIS — Z961 Presence of intraocular lens: Secondary | ICD-10-CM | POA: Diagnosis not present

## 2023-01-14 DIAGNOSIS — H524 Presbyopia: Secondary | ICD-10-CM | POA: Diagnosis not present

## 2023-01-14 DIAGNOSIS — H52203 Unspecified astigmatism, bilateral: Secondary | ICD-10-CM | POA: Diagnosis not present

## 2023-02-08 DIAGNOSIS — E785 Hyperlipidemia, unspecified: Secondary | ICD-10-CM | POA: Diagnosis not present

## 2023-02-08 DIAGNOSIS — R001 Bradycardia, unspecified: Secondary | ICD-10-CM | POA: Diagnosis not present

## 2023-02-08 DIAGNOSIS — I471 Supraventricular tachycardia, unspecified: Secondary | ICD-10-CM | POA: Diagnosis not present

## 2023-03-23 ENCOUNTER — Other Ambulatory Visit (HOSPITAL_BASED_OUTPATIENT_CLINIC_OR_DEPARTMENT_OTHER): Payer: Self-pay

## 2023-04-15 ENCOUNTER — Other Ambulatory Visit: Payer: Self-pay

## 2023-04-15 DIAGNOSIS — J61 Pneumoconiosis due to asbestos and other mineral fibers: Secondary | ICD-10-CM

## 2023-04-16 ENCOUNTER — Ambulatory Visit: Payer: Medicare Other | Admitting: Nurse Practitioner

## 2023-04-16 ENCOUNTER — Ambulatory Visit (INDEPENDENT_AMBULATORY_CARE_PROVIDER_SITE_OTHER): Payer: Medicare Other | Admitting: Pulmonary Disease

## 2023-04-16 ENCOUNTER — Encounter: Payer: Self-pay | Admitting: Nurse Practitioner

## 2023-04-16 ENCOUNTER — Ambulatory Visit: Payer: Medicare Other | Admitting: Adult Health

## 2023-04-16 VITALS — BP 120/80 | HR 72 | Temp 98.2°F | Ht 70.0 in | Wt 174.0 lb

## 2023-04-16 DIAGNOSIS — J61 Pneumoconiosis due to asbestos and other mineral fibers: Secondary | ICD-10-CM

## 2023-04-16 DIAGNOSIS — J849 Interstitial pulmonary disease, unspecified: Secondary | ICD-10-CM | POA: Diagnosis not present

## 2023-04-16 LAB — PULMONARY FUNCTION TEST
DL/VA % pred: 107 %
DL/VA: 4.12 ml/min/mmHg/L
DLCO cor % pred: 101 %
DLCO cor: 24.25 ml/min/mmHg
DLCO unc % pred: 101 %
DLCO unc: 24.25 ml/min/mmHg
FEF 25-75 Post: 3.11 L/s
FEF 25-75 Pre: 1.13 L/s
FEF2575-%Change-Post: 174 %
FEF2575-%Pred-Post: 174 %
FEF2575-%Pred-Pre: 63 %
FEV1-%Change-Post: 99 %
FEV1-%Pred-Post: 116 %
FEV1-%Pred-Pre: 58 %
FEV1-Post: 3.15 L
FEV1-Pre: 1.58 L
FEV1FVC-%Change-Post: 96 %
FEV1FVC-%Pred-Pre: 57 %
FEV6-%Change-Post: 2 %
FEV6-%Pred-Post: 109 %
FEV6-%Pred-Pre: 107 %
FEV6-Post: 3.93 L
FEV6-Pre: 3.84 L
FEV6FVC-%Change-Post: 0 %
FEV6FVC-%Pred-Post: 107 %
FEV6FVC-%Pred-Pre: 106 %
FVC-%Change-Post: 1 %
FVC-%Pred-Post: 102 %
FVC-%Pred-Pre: 101 %
FVC-Post: 3.96 L
FVC-Pre: 3.91 L
Post FEV1/FVC ratio: 80 %
Post FEV6/FVC ratio: 99 %
Pre FEV1/FVC ratio: 40 %
Pre FEV6/FVC Ratio: 99 %
RV % pred: 163 %
RV: 4.47 L
TLC % pred: 113 %
TLC: 8.04 L

## 2023-04-16 NOTE — Patient Instructions (Addendum)
You had some trouble with your initial pulmonary tests. Your diffusing capacity was still normal, which is good. Your postbronchodilator testing was also still normal and stable compared to previous.   We will continue to monitor your symptoms moving forward. We can consider another test next year, but we will wait to see you back to decide since you would prefer to not do another and are not having any symptoms right now.  Follow up with Dr. Francine Graven in one year. If symptoms worsen, please contact office for sooner follow up

## 2023-04-16 NOTE — Patient Instructions (Signed)
Full PFT performed today. °

## 2023-04-16 NOTE — Progress Notes (Signed)
@Patient  ID: Miguel Thomas, male    DOB: Apr 03, 1941, 82 y.o.   MRN: [id number]  Chief Complaint  Patient presents with   Follow-up    Pft review,     Referring provider: Sandford Craze, NP  HPI: 82 year old male, never smoker followed for pulmonary asbestosis. He is a patient of Dr. Lanora Manis and last seen in office 12/15/2021. Past medical history significant for GERD.   TEST/EVENTS:  01/07/2022 HRCT chest: Atherosclerosis.  No LAD.  Numerous calcified pleural plaques throughout the thorax bilaterally compatible with asbestos related pleural disease.  Widespread but patchy areas of groundglass attenuation, septal thickening, extensive subpleural reticulation, traction bronchiectasis and peripheral bronchiolectasis scattered throughout the lungs bilaterally with a definitive craniocaudal gradient.  Findings appear mildly progressed compared to prior study.  Some areas of mild honeycombing are noted.  UIP pattern.  Focal nodular thickening along the superior lateral margin of the right major fissure, correlates to fatty attenuation on soft tissue windows.  6.4 cm lesion of the right kidney, previously characterized as simple cyst. 12/15/2021 PFT: FVC 105, FEV1 118, ratio 81, DLCO 80.  Normal lung function 04/16/2023 PFT: FVC post 102, FEV1 post 216, ratio 80, TLC 113, DLCO 101.  Failed prebronchodilator spirometry per PFT tech  12/15/2022: OV with Dr. Francine Graven. Never smoker with GERD and hiatal hernia. He has pulmonary asbestosis. Remains active. No limitations with ADLs. PFTs are WNL. Not aware of any asbestos exposure. 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 Had COVID in January 2022 with mild case. Would not recommend antifibrotics at this time given risks outweigh benefit. Continue with surveillance.   04/16/2023: Today - follow up Patient presents today for follow up. He had some more difficulties with his PFTs this time. He was not able to pass the pre spirometry testing.  He was able to perform the DLCO and post testing, all of which was normal and stable compared to previous. He felt like he just couldn't get coordinated. There was some comprehension difficulties per the PFT tech.  Overall, he tells me that he feels unchanged compared to the last time he was here.  He does not have any limitations in his activities.  Able to mow his grass without any difficulties.  Does not feel like he gets short of breath.  Denies any significant cough or chest congestion.  He would prefer to not undergo pulmonary function testing again if he does not absolutely need to.  No Known Allergies  Immunization History  Administered Date(s) Administered   Fluad Quad(high Dose 65+) 07/31/2020, 07/09/2021, 08/12/2022   Influenza Whole 08/22/2007, 07/13/2009, 07/19/2010   Influenza, High Dose Seasonal PF 07/23/2015, 08/11/2016, 08/19/2018   Influenza-Unspecified 08/02/2014, 08/03/2019   PFIZER(Purple Top)SARS-COV-2 Vaccination 02/10/2020, 03/11/2020, 10/05/2020   Pfizer Covid-19 Vaccine Bivalent Booster 74yrs & up 08/01/2021   Pneumococcal Conjugate-13 01/22/2015   Pneumococcal Polysaccharide-23 02/14/2007   Td 05/19/2004, 01/22/2015   Zoster Recombinat (Shingrix) 08/21/2020, 12/09/2020   Zoster, Live 11/02/2009    Past Medical History:  Diagnosis Date   Anemia    Cancer ([facility]) 10/2019   melanoma on scalp   Dyspnea    had positive covid test 11-04-19   GERD (gastroesophageal reflux disease) 12/12/2004   ring like esophageal stricture with reflux esophagitis, also in 2011   Hiatal hernia    History of adenomatous polyp of colon    tubular adenom's in 2006;  2011; 03-05-2015   History of esophageal stricture  s/p  dilatation 2006  and 2012   Penile ulcer    Plantar fasciitis    Plantar fasciitis, left 05/18/2016   Prostate nodule    Pseudophakia of both eyes    Wears hearing aid in both ears     Tobacco History: Social History   Tobacco Use  Smoking Status Never   Smokeless Tobacco Never   Counseling given: Not Answered   Outpatient Medications Prior to Visit  Medication Sig Dispense Refill   omeprazole (PRILOSEC) 40 MG capsule Take 1 capsule (40 mg total) by mouth daily. 90 capsule 4   No facility-administered medications prior to visit.     Review of Systems:   Constitutional: No weight loss or gain, night sweats, fevers, chills, or lassitude. +occasional fatigue  HEENT: No headaches, difficulty swallowing, tooth/dental problems, or sore throat. No sneezing, itching, ear ache, nasal congestion, or post nasal drip CV:  No chest pain, orthopnea, PND, swelling in lower extremities, anasarca, dizziness, palpitations, syncope Resp: No shortness of breath with exertion or at rest. No excess mucus or change in color of mucus. No productive or non-productive. No hemoptysis. No wheezing.  No chest wall deformity GI:  No heartburn, indigestion MSK:  No joint pain or swelling.   Neuro: No dizziness or lightheadedness.  Psych: No depression or anxiety. Mood stable.     Physical Exam:  BP 120/80   Pulse 72   Temp 98.2 F (36.8 C)   Ht 5\' 10"  (1.778 m)   Wt 174 lb (78.9 kg)   SpO2 96%   BMI 24.97 kg/m   GEN: Pleasant, interactive, well-kempt; elderly; in no acute distress. HEENT:  Normocephalic and atraumatic. PERRLA. Sclera white. Nasal turbinates pink, moist and patent bilaterally. No rhinorrhea present. Oropharynx pink and moist, without exudate or edema. No lesions, ulcerations, or postnasal drip.  NECK:  Supple w/ fair ROM. No JVD present.  CV: RRR, no m/r/g, no peripheral edema. Pulses intact, +2 bilaterally. No cyanosis, pallor or clubbing. PULMONARY:  Unlabored, regular breathing. Clear bilaterally A&P w/o wheezes/rales/rhonchi. No accessory muscle use.  GI: BS present and normoactive. Soft, non-tender to palpation. No organomegaly or masses detected.  MSK: No erythema, warmth or tenderness. Cap refil <2 sec all extrem. No deformities  or joint swelling noted.  Neuro: A/Ox3. No focal deficits noted.   Skin: Warm, no lesions or rashe Psych: Normal affect and behavior. Judgement and thought content appropriate.     Lab Results:  CBC    Component Value Date/Time   WBC 4.6 08/01/2021 0809   RBC 3.87 (L) 08/01/2021 0809   HGB 12.7 (L) 08/01/2021 0809   HCT 37.9 (L) 08/01/2021 0809   PLT 204.0 08/01/2021 0809   MCV 98.1 08/01/2021 0809   MCH 33.2 (H) 07/31/2020 0738   MCHC 33.4 08/01/2021 0809   RDW 14.8 08/01/2021 0809   LYMPHSABS 1.2 08/01/2021 0809   MONOABS 0.5 08/01/2021 0809   EOSABS 0.3 08/01/2021 0809   BASOSABS 0.1 08/01/2021 0809    BMET    Component Value Date/Time   NA 140 02/11/2022 0822   K 3.6 02/11/2022 0822   CL 105 02/11/2022 0822   CO2 27 02/11/2022 0822   GLUCOSE 117 (H) 02/11/2022 0822   BUN 14 02/11/2022 0822   CREATININE 0.85 02/11/2022 0822   CREATININE 0.86 08/09/2013 0922   CALCIUM 8.5 02/11/2022 0822   GFRNONAA >60 02/29/2020 0529   GFRNONAA 87 06/24/2012 1041   GFRAA >60 02/29/2020 0529   GFRAA >89 06/24/2012 1041  BNP No results found for: "BNP"   Imaging:  No results found.       Latest Ref Rng & Units 04/15/2023   11:42 AM 12/15/2021    2:36 PM  PFT Results  FVC-Pre L 3.91  P 4.10   FVC-Predicted Pre % 101  P 105   FVC-Post L 3.96  P 4.03   FVC-Predicted Post % 102  P 103   Pre FEV1/FVC % % 40  P 79   Post FEV1/FCV % % 80  P 81   FEV1-Pre L 1.58  P 3.26   FEV1-Predicted Pre % 58  P 118   FEV1-Post L 3.15  P 3.27   DLCO uncorrected ml/min/mmHg 24.25  P 19.39   DLCO UNC% % 101  P 80   DLCO corrected ml/min/mmHg 24.25  P 19.39   DLCO COR %Predicted % 101  P 80   DLVA Predicted % 107  P 85   TLC L 8.04  P   TLC % Predicted % 113  P   RV % Predicted % 163  P     P Preliminary result    No results found for: "NITRICOXIDE"      Assessment & Plan:   Asbestosis (HCC) Asbestosis with fibrotic lung disease, classified as UIP. Shared decision at  previous visit not to start antifibrotics. He is clinically stable. Some difficulties with testing today; his post spirometry and DLCO are nl and unchanged from previous testing. Shared decision to move forward with watchful waiting. Would not schedule him for additional testing at this time, per his wishes. We can re-evaluate at follow up if symptoms change. Encouraged to remain active.   Patient Instructions  You had some trouble with your initial pulmonary tests. Your diffusing capacity was still normal, which is good. Your postbronchodilator testing was also still normal and stable compared to previous.   We will continue to monitor your symptoms moving forward. We can consider another test next year, but we will wait to see you back to decide since you would prefer to not do another and are not having any symptoms right now.  Follow up with Dr. Francine Graven in one year. If symptoms worsen, please contact office for sooner follow up    ILD (interstitial lung disease) (HCC) See above    I spent 35 minutes of dedicated to the care of this patient on the date of this encounter to include pre-visit review of records, face-to-face time with the patient discussing conditions above, post visit ordering of testing, clinical documentation with the electronic health record, making appropriate referrals as documented, and communicating necessary findings to members of the patients care team.  Noemi Chapel, NP 04/16/2023  Pt aware and understands NP's role.

## 2023-04-16 NOTE — Assessment & Plan Note (Addendum)
Asbestosis with fibrotic lung disease, classified as UIP. Shared decision at previous visit not to start antifibrotics. He is clinically stable. Some difficulties with testing today; his post spirometry and DLCO are nl and unchanged from previous testing. Shared decision to move forward with watchful waiting. Would not schedule him for additional testing at this time, per his wishes. We can re-evaluate at follow up if symptoms change. Encouraged to remain active.   Patient Instructions  You had some trouble with your initial pulmonary tests. Your diffusing capacity was still normal, which is good. Your postbronchodilator testing was also still normal and stable compared to previous.   We will continue to monitor your symptoms moving forward. We can consider another test next year, but we will wait to see you back to decide since you would prefer to not do another and are not having any symptoms right now.  Follow up with Dr. Francine Graven in one year. If symptoms worsen, please contact office for sooner follow up

## 2023-04-16 NOTE — Assessment & Plan Note (Signed)
See above

## 2023-04-16 NOTE — Progress Notes (Signed)
Full PFT performed today. °

## 2023-05-19 DIAGNOSIS — L570 Actinic keratosis: Secondary | ICD-10-CM | POA: Diagnosis not present

## 2023-05-19 DIAGNOSIS — Z859 Personal history of malignant neoplasm, unspecified: Secondary | ICD-10-CM | POA: Diagnosis not present

## 2023-05-19 DIAGNOSIS — D485 Neoplasm of uncertain behavior of skin: Secondary | ICD-10-CM | POA: Diagnosis not present

## 2023-05-19 DIAGNOSIS — L821 Other seborrheic keratosis: Secondary | ICD-10-CM | POA: Diagnosis not present

## 2023-05-19 DIAGNOSIS — Z85820 Personal history of malignant melanoma of skin: Secondary | ICD-10-CM | POA: Diagnosis not present

## 2023-05-19 DIAGNOSIS — L578 Other skin changes due to chronic exposure to nonionizing radiation: Secondary | ICD-10-CM | POA: Diagnosis not present

## 2023-06-14 DIAGNOSIS — C434 Malignant melanoma of scalp and neck: Secondary | ICD-10-CM | POA: Diagnosis not present

## 2023-07-07 ENCOUNTER — Telehealth: Payer: Self-pay

## 2023-07-07 NOTE — Patient Outreach (Signed)
  Care Coordination   07/07/2023 Name: BEJAN JACOBS MRN: [id number] DOB: 01-30-1941   Care Coordination Outreach Attempts:  An unsuccessful telephone outreach was attempted today to offer the patient information about available care coordination services.  Follow Up Plan:  Additional outreach attempts will be made to offer the patient care coordination information and services.   Encounter Outcome:  No Answer   Care Coordination Interventions:  No, not indicated    Kathyrn Sheriff, RN, MSN, BSN, CCM Care Management Coordinator [phone]

## 2023-07-14 ENCOUNTER — Ambulatory Visit (INDEPENDENT_AMBULATORY_CARE_PROVIDER_SITE_OTHER): Payer: Medicare Other | Admitting: *Deleted

## 2023-07-14 VITALS — BP 130/63 | HR 76 | Ht 70.0 in | Wt 173.2 lb

## 2023-07-14 DIAGNOSIS — Z23 Encounter for immunization: Secondary | ICD-10-CM | POA: Diagnosis not present

## 2023-07-14 DIAGNOSIS — Z0000 Encounter for general adult medical examination without abnormal findings: Secondary | ICD-10-CM

## 2023-07-14 NOTE — Patient Instructions (Signed)
Miguel Thomas , Thank you for taking time to come for your Medicare Wellness Visit. I appreciate your ongoing commitment to your health goals. Please review the following plan we discussed and let me know if I can assist you in the future.   These are the goals we discussed:  Goals      Begin painting artwork again.     Patient Stated     Maintain current health        This is a list of the screening recommended for you and due dates:  Health Maintenance  Topic Date Due   COVID-19 Vaccine (5 - 2023-24 season) 07/04/2023   Medicare Annual Wellness Visit  07/13/2024   DTaP/Tdap/Td vaccine (3 - Tdap) 01/21/2025   Pneumonia Vaccine  Completed   Flu Shot  Completed   Zoster (Shingles) Vaccine  Completed   HPV Vaccine  Aged Out     Next appointment: Follow up in one year for your annual wellness visit.   Preventive Care 30 Years and Older, Male Preventive care refers to lifestyle choices and visits with your health care provider that can promote health and wellness. What does preventive care include? A yearly physical exam. This is also called an annual well check. Dental exams once or twice a year. Routine eye exams. Ask your health care provider how often you should have your eyes checked. Personal lifestyle choices, including: Daily care of your teeth and gums. Regular physical activity. Eating a healthy diet. Avoiding tobacco and drug use. Limiting alcohol use. Practicing safe sex. Taking low doses of aspirin every day. Taking vitamin and mineral supplements as recommended by your health care provider. What happens during an annual well check? The services and screenings done by your health care provider during your annual well check will depend on your age, overall health, lifestyle risk factors, and family history of disease. Counseling  Your health care provider may ask you questions about your: Alcohol use. Tobacco use. Drug use. Emotional well-being. Home and  relationship well-being. Sexual activity. Eating habits. History of falls. Memory and ability to understand (cognition). Work and work Astronomer. Screening  You may have the following tests or measurements: Height, weight, and BMI. Blood pressure. Lipid and cholesterol levels. These may be checked every 5 years, or more frequently if you are over 53 years old. Skin check. Lung cancer screening. You may have this screening every year starting at age 26 if you have a 30-pack-year history of smoking and currently smoke or have quit within the past 15 years. Fecal occult blood test (FOBT) of the stool. You may have this test every year starting at age 69. Flexible sigmoidoscopy or colonoscopy. You may have a sigmoidoscopy every 5 years or a colonoscopy every 10 years starting at age 63. Prostate cancer screening. Recommendations will vary depending on your family history and other risks. Hepatitis C blood test. Hepatitis B blood test. Sexually transmitted disease (STD) testing. Diabetes screening. This is done by checking your blood sugar (glucose) after you have not eaten for a while (fasting). You may have this done every 1-3 years. Abdominal aortic aneurysm (AAA) screening. You may need this if you are a current or former smoker. Osteoporosis. You may be screened starting at age 23 if you are at high risk. Talk with your health care provider about your test results, treatment options, and if necessary, the need for more tests. Vaccines  Your health care provider may recommend certain vaccines, such as: Influenza vaccine. This is  recommended every year. Tetanus, diphtheria, and acellular pertussis (Tdap, Td) vaccine. You may need a Td booster every 10 years. Zoster vaccine. You may need this after age 58. Pneumococcal 13-valent conjugate (PCV13) vaccine. One dose is recommended after age 7. Pneumococcal polysaccharide (PPSV23) vaccine. One dose is recommended after age 2. Talk to your  health care provider about which screenings and vaccines you need and how often you need them. This information is not intended to replace advice given to you by your health care provider. Make sure you discuss any questions you have with your health care provider. Document Released: 11/15/2015 Document Revised: 07/08/2016 Document Reviewed: 08/20/2015 Elsevier Interactive Patient Education  2017 [employer].  Fall Prevention in the Home Falls can cause injuries. They can happen to people of all ages. There are many things you can do to make your home safe and to help prevent falls. What can I do on the outside of my home? Regularly fix the edges of walkways and driveways and fix any cracks. Remove anything that might make you trip as you walk through a door, such as a raised step or threshold. Trim any bushes or trees on the path to your home. Use bright outdoor lighting. Clear any walking paths of anything that might make someone trip, such as rocks or tools. Regularly check to see if handrails are loose or broken. Make sure that both sides of any steps have handrails. Any raised decks and porches should have guardrails on the edges. Have any leaves, snow, or ice cleared regularly. Use sand or salt on walking paths during winter. Clean up any spills in your garage right away. This includes oil or grease spills. What can I do in the bathroom? Use night lights. Install grab bars by the toilet and in the tub and shower. Do not use towel bars as grab bars. Use non-skid mats or decals in the tub or shower. If you need to sit down in the shower, use a plastic, non-slip stool. Keep the floor dry. Clean up any water that spills on the floor as soon as it happens. Remove soap buildup in the tub or shower regularly. Attach bath mats securely with double-sided non-slip rug tape. Do not have throw rugs and other things on the floor that can make you trip. What can I do in the bedroom? Use night  lights. Make sure that you have a light by your bed that is easy to reach. Do not use any sheets or blankets that are too big for your bed. They should not hang down onto the floor. Have a firm chair that has side arms. You can use this for support while you get dressed. Do not have throw rugs and other things on the floor that can make you trip. What can I do in the kitchen? Clean up any spills right away. Avoid walking on wet floors. Keep items that you use a lot in easy-to-reach places. If you need to reach something above you, use a strong step stool that has a grab bar. Keep electrical cords out of the way. Do not use floor polish or wax that makes floors slippery. If you must use wax, use non-skid floor wax. Do not have throw rugs and other things on the floor that can make you trip. What can I do with my stairs? Do not leave any items on the stairs. Make sure that there are handrails on both sides of the stairs and use them. Fix handrails that are  broken or loose. Make sure that handrails are as long as the stairways. Check any carpeting to make sure that it is firmly attached to the stairs. Fix any carpet that is loose or worn. Avoid having throw rugs at the top or bottom of the stairs. If you do have throw rugs, attach them to the floor with carpet tape. Make sure that you have a light switch at the top of the stairs and the bottom of the stairs. If you do not have them, ask someone to add them for you. What else can I do to help prevent falls? Wear shoes that: Do not have high heels. Have rubber bottoms. Are comfortable and fit you well. Are closed at the toe. Do not wear sandals. If you use a stepladder: Make sure that it is fully opened. Do not climb a closed stepladder. Make sure that both sides of the stepladder are locked into place. Ask someone to hold it for you, if possible. Clearly Sherron and make sure that you can see: Any grab bars or handrails. First and last  steps. Where the edge of each step is. Use tools that help you move around (mobility aids) if they are needed. These include: Canes. Walkers. Scooters. Crutches. Turn on the lights when you go into a dark area. Replace any light bulbs as soon as they burn out. Set up your furniture so you have a clear path. Avoid moving your furniture around. If any of your floors are uneven, fix them. If there are any pets around you, be aware of where they are. Review your medicines with your doctor. Some medicines can make you feel dizzy. This can increase your chance of falling. Ask your doctor what other things that you can do to help prevent falls. This information is not intended to replace advice given to you by your health care provider. Make sure you discuss any questions you have with your health care provider. Document Released: 08/15/2009 Document Revised: 03/26/2016 Document Reviewed: 11/23/2014 Elsevier Interactive Patient Education  2017 [employer].

## 2023-07-14 NOTE — Progress Notes (Signed)
Subjective:   Miguel Thomas is a 82 y.o. male who presents for Medicare Annual/Subsequent preventive examination.  Visit Complete: In person   Review of Systems     Cardiac Risk Factors include: advanced age (>69men, >16 women);male gender     Objective:    Today's Vitals   07/14/23 0947  BP: 130/63  Pulse: 76  Weight: 173 lb 3.2 oz (78.6 kg)  Height: 5\' 10"  (1.778 m)   Body mass index is 24.85 kg/m.     07/14/2023    9:57 AM 07/08/2022    8:21 AM 07/01/2021    8:22 AM 03/05/2020    9:40 AM 02/24/2020    4:35 PM 02/24/2020    9:29 AM 12/05/2019    7:50 AM  Advanced Directives  Does Patient Have a Medical Advance Directive? No No No No No No No  Would patient like information on creating a medical advance directive? No - Patient declined No - Patient declined No - Patient declined No - Patient declined No - Patient declined No - Patient declined No - Patient declined    Current Medications (verified) Outpatient Encounter Medications as of 07/14/2023  Medication Sig   omeprazole (PRILOSEC) 40 MG capsule Take 1 capsule (40 mg total) by mouth daily.   No facility-administered encounter medications on file as of 07/14/2023.    Allergies (verified) Patient has no known allergies.   History: Past Medical History:  Diagnosis Date   Anemia    Cancer (HCC) 10/2019   melanoma on scalp   Dyspnea    had positive covid test 11-04-19   GERD (gastroesophageal reflux disease) 12/12/2004   ring like esophageal stricture with reflux esophagitis, also in 2011   Hiatal hernia    History of adenomatous polyp of colon    tubular adenom's in 2006;  2011; 03-05-2015   History of esophageal stricture    s/p  dilatation 2006  and 2012   Penile ulcer    Plantar fasciitis    Plantar fasciitis, left 05/18/2016   Prostate nodule    Pseudophakia of both eyes    Wears hearing aid in both ears    Past Surgical History:  Procedure Laterality Date   APPLICATION OF A-CELL OF EXTREMITY  N/A 12/05/2019   Procedure: APPLICATION OF A-CELL;  Surgeon: Almond Lint, MD;  Location: [facility];  Service: General;  Laterality: N/A;   CARDIOVASCULAR STRESS TEST  06/11/2011   normal nuclear study w/ no ischemia/  normal LV function and wall motion, ef 58%   CATARACT EXTRACTION W/ INTRAOCULAR LENS  IMPLANT, BILATERAL  2008 approx.   CHOLECYSTECTOMY N/A 02/27/2020   Procedure: LAPAROSCOPIC CHOLECYSTECTOMY;  Surgeon: Darnell Level, MD;  Location: [facility];  Service: General;  Laterality: N/A;   COLONOSCOPY  last one 03-05-2015   ERCP N/A 02/26/2020   Procedure: ENDOSCOPIC RETROGRADE CHOLANGIOPANCREATOGRAPHY (ERCP);  Surgeon: Hilarie Fredrickson, MD;  Location: [facility];  Service: Endoscopy;  Laterality: N/A;   ESOPHAGOGASTRODUODENOSCOPY  last one 01-06-2011   INGUINAL HERNIA REPAIR Right 1990's   MELANOMA EXCISION N/A 12/05/2019   Procedure: WIDE LOCAL EXCISION MELANOMA EXCISION OF SCALP;  Surgeon: Almond Lint, MD;  Location: [facility];  Service: General;  Laterality: N/A;   PENILE BIOPSY N/A 03/12/2017   Procedure: EXCISIONAL GLANS PENILE BIOPSY;  Surgeon: Jerilee Field, MD;  Location: [facility];  Service: Urology;  Laterality: N/A;   PROSTATE BIOPSY  11/2011   normal   REMOVAL OF STONES  02/26/2020   Procedure: REMOVAL OF STONES;  Surgeon: Hilarie Fredrickson, MD;  Location: [facility];  Service: Endoscopy;;   SKIN FULL THICKNESS GRAFT Left 12/05/2019   Procedure: SKIN GRAFT FULL THICKNESS FROM LEFT UPPER CHEST TO SCALP;  Surgeon: Almond Lint, MD;  Location: [facility];  Service: General;  Laterality: Left;   SPHINCTEROTOMY  02/26/2020   Procedure: SPHINCTEROTOMY;  Surgeon: Hilarie Fredrickson, MD;  Location: [facility];  Service: Endoscopy;;   TONSILLECTOMY  child   Family History  Problem Relation Age of Onset   Coronary artery disease Other    Heart disease Other        rheumatic   Colon cancer Neg Hx    Esophageal cancer Neg Hx     Rectal cancer Neg Hx    Stomach cancer Neg Hx    Social History   Socioeconomic History   Marital status: Widowed    Spouse name: Not on file   Number of children: 1   Years of education: Not on file   Highest education level: Not on file  Occupational History   Occupation: retired  Tobacco Use   Smoking status: Never   Smokeless tobacco: Never  Substance and Sexual Activity   Alcohol use: No    Alcohol/week: 0.0 standard drinks of alcohol   Drug use: No   Sexual activity: Not Currently  Other Topics Concern   Not on file  Social History Narrative   Retired   Married (2nd marriage)   Daily Caffeine- use up to [employer] daily         Social Determinants of Health   Financial Resource Strain: Low Risk  (07/14/2023)   Overall Financial Resource Strain (CARDIA)    Difficulty of Paying Living Expenses: Not hard at all  Food Insecurity: No Food Insecurity (07/14/2023)   Hunger Vital Sign    Worried About Running Out of Food in the Last Year: Never true    Ran Out of Food in the Last Year: Never true  Transportation Needs: No Transportation Needs (07/14/2023)   PRAPARE - Administrator, Civil Service (Medical): No    Lack of Transportation (Non-Medical): No  Physical Activity: Inactive (07/14/2023)   Exercise Vital Sign    Days of Exercise per Week: 0 days    Minutes of Exercise per Session: 0 min  Stress: No Stress Concern Present (07/14/2023)   [employer] of Occupational Health - Occupational Stress Questionnaire    Feeling of Stress : Not at all  Social Connections: Moderately Isolated (07/14/2023)   Social Connection and Isolation Panel [NHANES]    Frequency of Communication with Friends and Family: Once a week    Frequency of Social Gatherings with Friends and Family: More than three times a week    Attends Religious Services: More than 4 times per year    Active Member of [employer] or Organizations: No    Attends Banker Meetings: Never     Marital Status: Widowed    Tobacco Counseling Counseling given: Not Answered   Clinical Intake:  Pre-visit preparation completed: Yes  Pain : No/denies pain     BMI - recorded: 24.85 Nutritional Status: BMI of 19-24  Normal Nutritional Risks: None Diabetes: No  How often do you need to have someone help you when you read instructions, pamphlets, or other written materials from your doctor or pharmacy?: 1 - Never  Interpreter Needed?: No  Information entered by :: [employer]   Activities  of Daily Living    07/14/2023    9:50 AM  In your present state of health, do you have any difficulty performing the following activities:  Hearing? 1  Comment wears hearing aids  Vision? 0  Difficulty concentrating or making decisions? 0  Walking or climbing stairs? 0  Dressing or bathing? 0  Doing errands, shopping? 0  Preparing Food and eating ? N  Using the Toilet? N  In the past six months, have you accidently leaked urine? N  Do you have problems with loss of bowel control? N  Comment has had one or two accidents  Managing your Medications? N  Managing your Finances? N  Housekeeping or managing your Housekeeping? N    Patient Care Team: Sandford Craze, NP as PCP - General (Internal Medicine) Wendall Stade, MD as PCP - Cardiology (Cardiology) Iva Boop, MD as Consulting Physician (Gastroenterology) Wynona Canes, MD as Referring Physician (Urology) Almond Lint, MD as Consulting Physician (General Surgery) Cherlyn Roberts, MD as Referring Physician (Dermatology) Darnell Level, MD as Consulting Physician (General Surgery) [facility], [facility]  Indicate any recent Medical Services you may have received from other than [facility] providers in the past year (date may be approximate).     Assessment:   This is a routine wellness examination for [employer].  Hearing/Vision screen No results found.   Goals Addressed   None    Depression Screen     07/14/2023   10:02 AM 07/08/2022    8:23 AM 07/01/2021    8:25 AM 03/05/2020    9:48 AM 10/13/2019   10:38 AM 04/14/2018   10:12 AM 04/20/2017   11:40 AM  PHQ 2/9 Scores  PHQ - 2 Score 0 0 0 0 0 0 1  PHQ- 9 Score       6    Fall Risk    07/14/2023    9:57 AM 07/08/2022    8:23 AM 07/01/2021    8:24 AM 03/05/2020    9:48 AM 10/13/2019   10:42 AM  Fall Risk   Falls in the past year? 0 0 0 0 0  Number falls in past yr: 0 0 0 0 0  Injury with Fall? 0 0 0 0 0  Risk for fall due to : No Fall Risks No Fall Risks     Follow up Falls evaluation completed Falls evaluation completed Falls prevention discussed Education provided;Falls prevention discussed Falls evaluation completed    MEDICARE RISK AT HOME: Medicare Risk at Home Any stairs in or around the home?: No If so, are there any without handrails?: No Home free of loose throw rugs in walkways, pet beds, electrical cords, etc?: Yes Adequate lighting in your home to reduce risk of falls?: Yes Life alert?: No Use of a cane, walker or w/c?: No Grab bars in the bathroom?: No Shower chair or bench in shower?: No Elevated toilet seat or a handicapped toilet?: Yes (comfort height)  TIMED UP AND GO:  Was the test performed?  Yes  Length of time to ambulate 10 feet: 7 sec Gait steady and fast without use of assistive device    Cognitive Function:    04/14/2018   10:14 AM  MMSE - Mini Mental State Exam  Orientation to time 5  Orientation to Place 5  Registration 3  Attention/ Calculation 5  Recall 1  Language- name 2 objects 2  Language- repeat 1  Language- follow 3 step command 3  Language- read & follow  direction 1  Write a sentence 1  Copy design 1  Total score 28        07/14/2023   10:04 AM 07/08/2022    8:29 AM  6CIT Screen  What Year? 0 points 0 points  What month? 0 points 0 points  What time? 0 points 0 points  Count back from 20 0 points 0 points  Months in reverse 0 points 0 points  Repeat phrase 0 points 8 points   Total Score 0 points 8 points    Immunizations Immunization History  Administered Date(s) Administered   Fluad Quad(high Dose 65+) 07/31/2020, 07/09/2021, 08/12/2022   Influenza Whole 08/22/2007, 07/13/2009, 07/19/2010   Influenza, High Dose Seasonal PF 07/23/2015, 08/11/2016, 08/19/2018   Influenza-Unspecified 08/02/2014, 08/03/2019   PFIZER(Purple Top)SARS-COV-2 Vaccination 02/10/2020, 03/11/2020, 10/05/2020   Pfizer Covid-19 Vaccine Bivalent Booster 44yrs & up 08/01/2021   Pneumococcal Conjugate-13 01/22/2015   Pneumococcal Polysaccharide-23 02/14/2007   Td 05/19/2004, 01/22/2015   Zoster Recombinant(Shingrix) 08/21/2020, 12/09/2020   Zoster, Live 11/02/2009    TDAP status: Up to date  Flu Vaccine status: Completed at today's visit  Pneumococcal vaccine status: Up to date  Covid-19 vaccine status: Information provided on how to obtain vaccines.   Qualifies for Shingles Vaccine? Yes   Zostavax completed Yes   Shingrix Completed?: Yes  Screening Tests Health Maintenance  Topic Date Due   INFLUENZA VACCINE  06/03/2023   COVID-19 Vaccine (5 - 2023-24 season) 07/04/2023   Medicare Annual Wellness (AWV)  07/09/2023   DTaP/Tdap/Td (3 - Tdap) 01/21/2025   Pneumonia Vaccine 27+ Years old  Completed   Zoster Vaccines- Shingrix  Completed   HPV VACCINES  Aged Out    Health Maintenance  Health Maintenance Due  Topic Date Due   INFLUENZA VACCINE  06/03/2023   COVID-19 Vaccine (5 - 2023-24 season) 07/04/2023   Medicare Annual Wellness (AWV)  07/09/2023    Colorectal cancer screening: No longer required.   Lung Cancer Screening: (Low Dose CT Chest recommended if Age 78-80 years, 20 pack-year currently smoking OR have quit w/in 15years.) does not qualify.   Additional Screening:  Hepatitis C Screening: does not qualify  Vision Screening: Recommended annual ophthalmology exams for early detection of glaucoma and other disorders of the eye. Is the patient up to date  with their annual eye exam?  Yes  Who is the provider or what is the name of the office in which the patient attends annual eye exams? Used to see Dr. Nile Riggs but will be establishing with new eye doctor If pt is not established with a provider, would they like to be referred to a provider to establish care? No .   Dental Screening: Recommended annual dental exams for proper oral hygiene  Diabetic Foot Exam: N/a  Community Resource Referral / Chronic Care Management: CRR required this visit?  No   CCM required this visit?  No     Plan:     I have personally reviewed and noted the following in the patient's chart:   Medical and social history Use of alcohol, tobacco or illicit drugs  Current medications and supplements including opioid prescriptions. Patient is not currently taking opioid prescriptions. Functional ability and status Nutritional status Physical activity Advanced directives List of other physicians Hospitalizations, surgeries, and ER visits in previous 12 months Vitals Screenings to include cognitive, depression, and falls Referrals and appointments  In addition, I have reviewed and discussed with patient certain preventive protocols, quality metrics, and best practice recommendations. A  written personalized care plan for preventive services as well as general preventive health recommendations were provided to patient.     Donne Anon, CMA   07/14/2023   After Visit Summary: Pt declined  Nurse Notes: None

## 2023-08-03 ENCOUNTER — Telehealth: Payer: Self-pay

## 2023-08-03 NOTE — Patient Outreach (Signed)
Care Coordination   08/03/2023 Name: Miguel Thomas MRN: [id number] DOB: [birth year]   Care Coordination Outreach Attempts:  A second unsuccessful outreach was attempted today to offer the patient with information about available care coordination services.  Follow Up Plan:  Additional outreach attempts will be made to offer the patient care coordination information and services.   Encounter Outcome:  No Answer   Care Coordination Interventions:  No, not indicated    Kathyrn Sheriff, RN, MSN, BSN, CCM Care Management Coordinator [phone]

## 2023-08-10 ENCOUNTER — Telehealth: Payer: Self-pay

## 2023-08-10 NOTE — Patient Outreach (Signed)
Care Coordination   Initial Visit Note   08/10/2023 Name: Osman Case MRN: [id number] DOB: 1941/04/08  Burnett Sistare is a 82 y.o. year old male who sees Sandford Craze, NP for primary care. I spoke with  Seleta Rhymes by phone today.  What matters to the patients health and wellness today?  Patient declines to confirm HIPAA information. Declined to complete assessment.  Goals Addressed             This Visit's Progress    COMPLETED: Care Coordination needs       Interventions Today    Flowsheet Row Most Recent Value  General Interventions   General Interventions Discussed/Reviewed General Interventions Discussed  [Discussed care management program. RNCM encouraged patient to contact primary care provider if care management needs in the future.]            SDOH assessments and interventions completed:  No  Care Coordination Interventions:  Yes, provided   Follow up plan: No further intervention required.   Encounter Outcome:  Patient Visit Completed   Kathyrn Sheriff, RN, MSN, BSN, CCM Care Management Coordinator [phone]

## 2023-08-16 ENCOUNTER — Encounter: Payer: Medicare Other | Admitting: Family

## 2023-08-25 ENCOUNTER — Other Ambulatory Visit (HOSPITAL_BASED_OUTPATIENT_CLINIC_OR_DEPARTMENT_OTHER): Payer: Self-pay

## 2023-08-25 ENCOUNTER — Ambulatory Visit (INDEPENDENT_AMBULATORY_CARE_PROVIDER_SITE_OTHER): Payer: Medicare Other | Admitting: Family

## 2023-08-25 ENCOUNTER — Encounter: Payer: Self-pay | Admitting: Family

## 2023-08-25 VITALS — BP 139/71 | HR 62 | Temp 98.0°F | Resp 16 | Ht 70.0 in | Wt 174.0 lb

## 2023-08-25 DIAGNOSIS — K2100 Gastro-esophageal reflux disease with esophagitis, without bleeding: Secondary | ICD-10-CM | POA: Diagnosis not present

## 2023-08-25 DIAGNOSIS — Z0000 Encounter for general adult medical examination without abnormal findings: Secondary | ICD-10-CM | POA: Diagnosis not present

## 2023-08-25 DIAGNOSIS — J61 Pneumoconiosis due to asbestos and other mineral fibers: Secondary | ICD-10-CM | POA: Diagnosis not present

## 2023-08-25 DIAGNOSIS — R739 Hyperglycemia, unspecified: Secondary | ICD-10-CM | POA: Diagnosis not present

## 2023-08-25 DIAGNOSIS — D649 Anemia, unspecified: Secondary | ICD-10-CM | POA: Diagnosis not present

## 2023-08-25 DIAGNOSIS — Z860100 Personal history of colon polyps, unspecified: Secondary | ICD-10-CM

## 2023-08-25 LAB — COMPREHENSIVE METABOLIC PANEL
ALT: 19 U/L (ref 0–53)
AST: 25 U/L (ref 0–37)
Albumin: 4 g/dL (ref 3.5–5.2)
Alkaline Phosphatase: 87 U/L (ref 39–117)
BUN: 21 mg/dL (ref 6–23)
CO2: 28 meq/L (ref 19–32)
Calcium: 9.1 mg/dL (ref 8.4–10.5)
Chloride: 103 meq/L (ref 96–112)
Creatinine, Ser: 0.87 mg/dL (ref 0.40–1.50)
GFR: 79.13 mL/min (ref >60.00)
Glucose, Bld: 95 mg/dL (ref 70–99)
Potassium: 3.8 meq/L (ref 3.5–5.1)
Sodium: 139 meq/L (ref 135–145)
Total Bilirubin: 0.8 mg/dL (ref 0.2–1.2)
Total Protein: 7.4 g/dL (ref 6.0–8.3)

## 2023-08-25 LAB — HEMOGLOBIN A1C: Hgb A1c MFr Bld: 5.2 % (ref 4.6–6.5)

## 2023-08-25 LAB — CBC WITH DIFFERENTIAL/PLATELET
Basophils Absolute: 0 10*3/uL (ref 0.0–0.1)
Basophils Relative: 0.8 % (ref 0.0–3.0)
Eosinophils Absolute: 0.3 10*3/uL (ref 0.0–0.7)
Eosinophils Relative: 5.5 % — ABNORMAL HIGH (ref 0.0–5.0)
HCT: 40 % (ref 39.0–52.0)
Hemoglobin: 13.1 g/dL (ref 13.0–17.0)
Lymphocytes Relative: 25.5 % (ref 12.0–46.0)
Lymphs Abs: 1.3 10*3/uL (ref 0.7–4.0)
MCHC: 32.9 g/dL (ref 30.0–36.0)
MCV: 99.3 fL (ref 78.0–100.0)
Monocytes Absolute: 0.5 10*3/uL (ref 0.1–1.0)
Monocytes Relative: 10.3 % (ref 3.0–12.0)
Neutro Abs: 2.8 10*3/uL (ref 1.4–7.7)
Neutrophils Relative %: 57.9 % (ref 43.0–77.0)
Platelets: 220 10*3/uL (ref 150.0–400.0)
RBC: 4.02 Mil/uL — ABNORMAL LOW (ref 4.22–5.81)
RDW: 15.3 % (ref 11.5–15.5)
WBC: 4.9 10*3/uL (ref 4.0–10.5)

## 2023-08-25 MED ORDER — COVID-19 MRNA VAC-TRIS(PFIZER) 30 MCG/0.3ML IM SUSY
0.3000 mL | PREFILLED_SYRINGE | Freq: Once | INTRAMUSCULAR | 0 refills | Status: AC
  Filled 2023-08-25: qty 0.3, 1d supply, fill #0

## 2023-08-25 NOTE — Assessment & Plan Note (Signed)
Stable. Continue on Omeprazole

## 2023-08-25 NOTE — Assessment & Plan Note (Signed)
Has an appointment in December 2024

## 2023-08-25 NOTE — Assessment & Plan Note (Signed)
-   Lab work to be done to check kidney function, blood sugar, and blood count. - Patient advised to get COVID-19 vaccine at the pharmacy. - Follow-up in a year unless patient needs to see the doctor sooner. - recommended covid booster at the pharmacy.

## 2023-08-25 NOTE — Progress Notes (Signed)
Subjective:     Patient ID: Miguel Thomas, male    DOB: 1941/04/07, 82 y.o.   MRN: [id number]  Chief Complaint  Patient presents with   Annual Exam    HPI  Discussed the use of AI scribe software for clinical note transcription with the patient, who gave verbal consent to proceed.  82 year old male that presents to the clinic today for his yearly physical.   Flu shot completed  Covid educated on the new booster shot.   Shingles shot is up to date  Dental exam was in march 2024  Eye Exam was in March 2024.   Colonoscopy has an appointment in December  Has some on and off tingling sensation that is noted in his right side collar bone and it can go up in his jaw and neck area.          Health Maintenance Due  Topic Date Due   COVID-19 Vaccine (5 - 2023-24 season) 07/04/2023    Past Medical History:  Diagnosis Date   Anemia    Cancer (HCC) 10/2019   melanoma on scalp   Dyspnea    had positive covid test 11-04-19   GERD (gastroesophageal reflux disease) 12/12/2004   ring like esophageal stricture with reflux esophagitis, also in 2011   Hiatal hernia    History of adenomatous polyp of colon    tubular adenom's in 2006;  2011; 03-05-2015   History of esophageal stricture    s/p  dilatation 2006  and 2012   Penile ulcer    Plantar fasciitis    Plantar fasciitis, left 05/18/2016   Prostate nodule    Pseudophakia of both eyes    Wears hearing aid in both ears     Past Surgical History:  Procedure Laterality Date   APPLICATION OF A-CELL OF EXTREMITY N/A 12/05/2019   Procedure: APPLICATION OF A-CELL;  Surgeon: Almond Lint, MD;  Location: [facility];  Service: General;  Laterality: N/A;   CARDIOVASCULAR STRESS TEST  06/11/2011   normal nuclear study w/ no ischemia/  normal LV function and wall motion, ef 58%   CATARACT EXTRACTION W/ INTRAOCULAR LENS  IMPLANT, BILATERAL  2008 approx.   CHOLECYSTECTOMY N/A 02/27/2020   Procedure: LAPAROSCOPIC  CHOLECYSTECTOMY;  Surgeon: Darnell Level, MD;  Location: [facility];  Service: General;  Laterality: N/A;   COLONOSCOPY  last one 03-05-2015   ERCP N/A 02/26/2020   Procedure: ENDOSCOPIC RETROGRADE CHOLANGIOPANCREATOGRAPHY (ERCP);  Surgeon: Hilarie Fredrickson, MD;  Location: [facility];  Service: Endoscopy;  Laterality: N/A;   ESOPHAGOGASTRODUODENOSCOPY  last one 01-06-2011   INGUINAL HERNIA REPAIR Right 1990's   MELANOMA EXCISION N/A 12/05/2019   Procedure: WIDE LOCAL EXCISION MELANOMA EXCISION OF SCALP;  Surgeon: Almond Lint, MD;  Location: [facility];  Service: General;  Laterality: N/A;   PENILE BIOPSY N/A 03/12/2017   Procedure: EXCISIONAL GLANS PENILE BIOPSY;  Surgeon: Jerilee Field, MD;  Location: [facility];  Service: Urology;  Laterality: N/A;   PROSTATE BIOPSY  11/2011   normal   REMOVAL OF STONES  02/26/2020   Procedure: REMOVAL OF STONES;  Surgeon: Hilarie Fredrickson, MD;  Location: [facility];  Service: Endoscopy;;   SKIN FULL THICKNESS GRAFT Left 12/05/2019   Procedure: SKIN GRAFT FULL THICKNESS FROM LEFT UPPER CHEST TO SCALP;  Surgeon: Almond Lint, MD;  Location: [facility];  Service: General;  Laterality: Left;   SPHINCTEROTOMY  02/26/2020   Procedure: SPHINCTEROTOMY;  Surgeon:  Hilarie Fredrickson, MD;  Location: [facility];  Service: Endoscopy;;   TONSILLECTOMY  child    Family History  Problem Relation Age of Onset   Coronary artery disease Other    Heart disease Other        rheumatic   Colon cancer Neg Hx    Esophageal cancer Neg Hx    Rectal cancer Neg Hx    Stomach cancer Neg Hx     Social History   Socioeconomic History   Marital status: Widowed    Spouse name: Not on file   Number of children: 1   Years of education: Not on file   Highest education level: Not on file  Occupational History   Occupation: retired  Tobacco Use   Smoking status: Never   Smokeless tobacco: Never  Substance and Sexual Activity   Alcohol  use: No    Alcohol/week: 0.0 standard drinks of alcohol   Drug use: No   Sexual activity: Not Currently  Other Topics Concern   Not on file  Social History Narrative   Retired   Married (2nd marriage)   Daily Caffeine- use up to [employer] daily         Social Determinants of Health   Financial Resource Strain: Low Risk  (07/14/2023)   Overall Financial Resource Strain (CARDIA)    Difficulty of Paying Living Expenses: Not hard at all  Food Insecurity: No Food Insecurity (07/14/2023)   Hunger Vital Sign    Worried About Running Out of Food in the Last Year: Never true    Ran Out of Food in the Last Year: Never true  Transportation Needs: No Transportation Needs (07/14/2023)   PRAPARE - Administrator, Civil Service (Medical): No    Lack of Transportation (Non-Medical): No  Physical Activity: Inactive (07/14/2023)   Exercise Vital Sign    Days of Exercise per Week: 0 days    Minutes of Exercise per Session: 0 min  Stress: No Stress Concern Present (07/14/2023)   [employer] of Occupational Health - Occupational Stress Questionnaire    Feeling of Stress : Not at all  Social Connections: Moderately Isolated (07/14/2023)   Social Connection and Isolation Panel [NHANES]    Frequency of Communication with Friends and Family: Once a week    Frequency of Social Gatherings with Friends and Family: More than three times a week    Attends Religious Services: More than 4 times per year    Active Member of [employer] or Organizations: No    Attends Banker Meetings: Never    Marital Status: Widowed  Intimate Partner Violence: Not At Risk (07/14/2023)   Humiliation, Afraid, Rape, and Kick questionnaire    Fear of Current or Ex-Partner: No    Emotionally Abused: No    Physically Abused: No    Sexually Abused: No    Outpatient Medications Prior to Visit  Medication Sig Dispense Refill   omeprazole (PRILOSEC) 40 MG capsule Take 1 capsule (40 mg total) by mouth daily. 90  capsule 4   No facility-administered medications prior to visit.    No Known Allergies  Review of Systems  Constitutional:  Negative for diaphoresis, fever and weight loss.  HENT:  Negative for hearing loss, sinus pain and tinnitus.   Eyes:  Negative for pain and redness.  Respiratory:  Negative for cough and shortness of breath.   Cardiovascular:  Negative for chest pain and leg swelling.  Gastrointestinal:  Negative for abdominal pain, diarrhea  and vomiting.  Genitourinary:  Negative for dysuria.  Musculoskeletal:  Negative for myalgias and neck pain.  Skin:  Negative for rash.  Neurological:  Positive for tingling (in right side collar bone area). Negative for dizziness and headaches.       Objective:    Physical Exam Constitutional:      Appearance: Normal appearance. He is normal weight.  HENT:     Head: Normocephalic.     Right Ear: Tympanic membrane, ear canal and external ear normal.     Left Ear: Tympanic membrane, ear canal and external ear normal.     Nose: Nose normal.     Mouth/Throat:     Mouth: Mucous membranes are moist.     Pharynx: Oropharynx is clear.  Eyes:     Extraocular Movements: Extraocular movements intact.     Conjunctiva/sclera: Conjunctivae normal.     Pupils: Pupils are equal, round, and reactive to light.  Cardiovascular:     Rate and Rhythm: Normal rate and regular rhythm.  Pulmonary:     Effort: Pulmonary effort is normal.     Breath sounds: Normal breath sounds.  Abdominal:     General: Abdomen is flat. Bowel sounds are normal.     Palpations: Abdomen is soft.  Musculoskeletal:        General: Normal range of motion.     Cervical back: Normal range of motion.  Skin:    General: Skin is warm.     Capillary Refill: Capillary refill takes less than 2 seconds.  Neurological:     General: No focal deficit present.     Mental Status: He is alert and oriented to person, place, and time. Mental status is at baseline.  Psychiatric:         Mood and Affect: Mood normal.        Behavior: Behavior normal.        Thought Content: Thought content normal.        Judgment: Judgment normal.      BP 139/71 (BP Location: Right Arm, Patient Position: Sitting, Cuff Size: Small)   Pulse 62   Temp 98 F (36.7 C) (Oral)   Resp 16   Ht 5\' 10"  (1.778 m)   Wt 174 lb (78.9 kg)   SpO2 100%   BMI 24.97 kg/m  Wt Readings from Last 3 Encounters:  08/25/23 174 lb (78.9 kg)  07/14/23 173 lb 3.2 oz (78.6 kg)  04/16/23 174 lb (78.9 kg)       Assessment & Plan:   Problem List Items Addressed This Visit   None   I am having Trinna Balloon maintain his omeprazole.  No orders of the defined types were placed in this encounter.

## 2023-08-25 NOTE — Progress Notes (Signed)
Subjective:     Patient ID: Miguel Thomas, male    DOB: 1941-10-21, 82 y.o.   MRN: [id number]  Chief Complaint  Patient presents with   Annual Exam    HPI  Discussed the use of AI scribe software for clinical note transcription with the patient, who gave verbal consent to proceed.  History of Present Illness          Patient presents today for complete physical.  Immunizations: flu shot  up to date Diet:  fair diet, does his own cooking Exercise: has been doing some leg lifts in the bed, mows the grass. Colonoscopy: has appointment 12/24 PSA: has history of prostate nodule- benign biopsy 2013. Continues to follow with Urology. Lab Results  Component Value Date   PSA 0.64--[facility] 12/24/2015   PSA 0.87 01/11/2007  Vision: up to date, he plans to establish with new doctor  in March as Dr. Nile Riggs retired.  Dental:  up to date      Health Maintenance Due  Topic Date Due   COVID-19 Vaccine (5 - 2023-24 season) 07/04/2023    Past Medical History:  Diagnosis Date   Anemia    Cancer (HCC) 10/2019   melanoma on scalp   Dyspnea    had positive covid test 11-04-19   GERD (gastroesophageal reflux disease) 12/12/2004   ring like esophageal stricture with reflux esophagitis, also in 2011   Hiatal hernia    History of adenomatous polyp of colon    tubular adenom's in 2006;  2011; 03-05-2015   History of esophageal stricture    s/p  dilatation 2006  and 2012   Penile ulcer    Plantar fasciitis    Plantar fasciitis, left 05/18/2016   Prostate nodule    Pseudophakia of both eyes    Wears hearing aid in both ears     Past Surgical History:  Procedure Laterality Date   APPLICATION OF A-CELL OF EXTREMITY N/A 12/05/2019   Procedure: APPLICATION OF A-CELL;  Surgeon: Almond Lint, MD;  Location: [facility];  Service: General;  Laterality: N/A;   CARDIOVASCULAR STRESS TEST  06/11/2011   normal nuclear study w/ no ischemia/  normal LV function and  wall motion, ef 58%   CATARACT EXTRACTION W/ INTRAOCULAR LENS  IMPLANT, BILATERAL  2008 approx.   CHOLECYSTECTOMY N/A 02/27/2020   Procedure: LAPAROSCOPIC CHOLECYSTECTOMY;  Surgeon: Darnell Level, MD;  Location: [facility];  Service: General;  Laterality: N/A;   COLONOSCOPY  last one 03-05-2015   ERCP N/A 02/26/2020   Procedure: ENDOSCOPIC RETROGRADE CHOLANGIOPANCREATOGRAPHY (ERCP);  Surgeon: Hilarie Fredrickson, MD;  Location: [facility];  Service: Endoscopy;  Laterality: N/A;   ESOPHAGOGASTRODUODENOSCOPY  last one 01-06-2011   INGUINAL HERNIA REPAIR Right 1990's   MELANOMA EXCISION N/A 12/05/2019   Procedure: WIDE LOCAL EXCISION MELANOMA EXCISION OF SCALP;  Surgeon: Almond Lint, MD;  Location: [facility];  Service: General;  Laterality: N/A;   PENILE BIOPSY N/A 03/12/2017   Procedure: EXCISIONAL GLANS PENILE BIOPSY;  Surgeon: Jerilee Field, MD;  Location: [facility];  Service: Urology;  Laterality: N/A;   PROSTATE BIOPSY  11/2011   normal   REMOVAL OF STONES  02/26/2020   Procedure: REMOVAL OF STONES;  Surgeon: Hilarie Fredrickson, MD;  Location: [facility];  Service: Endoscopy;;   SKIN FULL THICKNESS GRAFT Left 12/05/2019   Procedure: SKIN GRAFT FULL THICKNESS FROM LEFT UPPER CHEST TO SCALP;  Surgeon: Almond Lint, MD;  Location: [facility];  Service: General;  Laterality: Left;   SPHINCTEROTOMY  02/26/2020   Procedure: SPHINCTEROTOMY;  Surgeon: Hilarie Fredrickson, MD;  Location: [facility];  Service: Endoscopy;;   TONSILLECTOMY  child    Family History  Problem Relation Age of Onset   Coronary artery disease Other    Heart disease Other        rheumatic   Colon cancer Neg Hx    Esophageal cancer Neg Hx    Rectal cancer Neg Hx    Stomach cancer Neg Hx     Social History   Socioeconomic History   Marital status: Widowed    Spouse name: Not on file   Number of children: 1   Years of education: Not on file   Highest education level: Not on file   Occupational History   Occupation: retired  Tobacco Use   Smoking status: Never   Smokeless tobacco: Never  Substance and Sexual Activity   Alcohol use: No    Alcohol/week: 0.0 standard drinks of alcohol   Drug use: No   Sexual activity: Not Currently  Other Topics Concern   Not on file  Social History Narrative   Retired,   Widowed 2022   Lives with his adult son   Used to work at [employer]   Daily Caffeine- use up to [employer] daily   Social Determinants of Corporate investment banker Strain: Low Risk  (07/14/2023)   Overall Financial Resource Strain (CARDIA)    Difficulty of Paying Living Expenses: Not hard at all  Food Insecurity: No Food Insecurity (07/14/2023)   Hunger Vital Sign    Worried About Running Out of Food in the Last Year: Never true    Ran Out of Food in the Last Year: Never true  Transportation Needs: No Transportation Needs (07/14/2023)   PRAPARE - Administrator, Civil Service (Medical): No    Lack of Transportation (Non-Medical): No  Physical Activity: Inactive (07/14/2023)   Exercise Vital Sign    Days of Exercise per Week: 0 days    Minutes of Exercise per Session: 0 min  Stress: No Stress Concern Present (07/14/2023)   [employer] of Occupational Health - Occupational Stress Questionnaire    Feeling of Stress : Not at all  Social Connections: Moderately Isolated (07/14/2023)   Social Connection and Isolation Panel [NHANES]    Frequency of Communication with Friends and Family: Once a week    Frequency of Social Gatherings with Friends and Family: More than three times a week    Attends Religious Services: More than 4 times per year    Active Member of [employer] or Organizations: No    Attends Banker Meetings: Never    Marital Status: Widowed  Intimate Partner Violence: Not At Risk (07/14/2023)   Humiliation, Afraid, Rape, and Kick questionnaire    Fear of Current or Ex-Partner: No    Emotionally Abused: No    Physically Abused: No     Sexually Abused: No    Outpatient Medications Prior to Visit  Medication Sig Dispense Refill   omeprazole (PRILOSEC) 40 MG capsule Take 1 capsule (40 mg total) by mouth daily. 90 capsule 4   No facility-administered medications prior to visit.    No Known Allergies  Review of Systems  Constitutional:  Negative for weight loss.  HENT:  Positive for hearing loss. Negative for congestion.   Eyes:        Wears glasses  Respiratory:  Negative  for cough.   Cardiovascular:  Negative for leg swelling.  Gastrointestinal:  Negative for constipation and diarrhea.  Genitourinary:  Negative for dysuria and frequency.       Nocturia x 2  Musculoskeletal:  Negative for joint pain and myalgias.  Skin:        Recurrent scalp scab at site of melanoma excision  Neurological:  Negative for headaches.  Psychiatric/Behavioral:         Denies depression/anxiety Sometimes he doesn't get along with his son which is stressful   Lab Results  Component Value Date   CHOL 190 02/25/2020   HDL 69 02/25/2020   LDLCALC 111 (H) 02/25/2020   LDLDIRECT 133.3 01/11/2007   TRIG 49 02/25/2020   CHOLHDL 2.8 02/25/2020       Objective:    Physical Exam Constitutional:      General: He is not in acute distress.    Appearance: He is well-developed.  HENT:     Head: Normocephalic.     Comments: Scab on top of scalp    Right Ear: Tympanic membrane and ear canal normal.     Left Ear: Tympanic membrane and ear canal normal.     Mouth/Throat:     Pharynx: Oropharynx is clear. No pharyngeal swelling, oropharyngeal exudate or posterior oropharyngeal erythema.  Eyes:     Extraocular Movements: Extraocular movements intact.  Cardiovascular:     Rate and Rhythm: Normal rate and regular rhythm.     Heart sounds: No murmur heard. Pulmonary:     Effort: Pulmonary effort is normal. No respiratory distress.     Breath sounds: Normal breath sounds. No wheezing or rales.  Musculoskeletal:        General: No  swelling.  Skin:    General: Skin is warm and dry.  Neurological:     Mental Status: He is alert and oriented to person, place, and time.  Psychiatric:        Behavior: Behavior normal.        Thought Content: Thought content normal.      BP 139/71 (BP Location: Right Arm, Patient Position: Sitting, Cuff Size: Small)   Pulse 62   Temp 98 F (36.7 C) (Oral)   Resp 16   Ht 5\' 10"  (1.778 m)   Wt 174 lb (78.9 kg)   SpO2 100%   BMI 24.97 kg/m  Wt Readings from Last 3 Encounters:  08/25/23 174 lb (78.9 kg)  07/14/23 173 lb 3.2 oz (78.6 kg)  04/16/23 174 lb (78.9 kg)       Assessment & Plan:   Problem List Items Addressed This Visit       Unprioritized   Preventative health care     - Lab work to be done to check kidney function, blood sugar, and blood count. - Patient advised to get COVID-19 vaccine at the pharmacy. - Follow-up in a year unless patient needs to see the doctor sooner. - recommended covid booster at the pharmacy.       History of colonic polyps    Has an appointment in December 2024       GERD - Primary    Stable. Continue on Omeprazole       Asbestosis (HCC)    Continue to follow up with pulmonary.      Other Visit Diagnoses     Hyperglycemia       Relevant Orders   HgB A1c   Comp Met (CMET)   Anemia, unspecified type  Relevant Orders   CBC w/Diff   Iron, TIBC and Ferritin Panel       I am having Trinna Balloon maintain his omeprazole.  No orders of the defined types were placed in this encounter.

## 2023-08-25 NOTE — Patient Instructions (Signed)
VISIT SUMMARY:  During your routine physical, we discussed several aspects of your health, including a tingling sensation near your collarbone, your cardiovascular health, diet and exercise habits, eye health, bone density, pulmonary health, dermatological health, gastrointestinal health, and urinary health. We also reviewed your general health maintenance and upcoming lab work.  YOUR PLAN:  -INTERMITTENT TINGLING SENSATION IN COLLARBONE AREA: This sensation is likely due to an irritated nerve in your neck. Since there is no pain or numbness, no immediate intervention is required. Please report if it becomes painful.  -CARDIOVASCULAR HEALTH: You have not had a recent EKG or follow-up with your cardiologist, Dr. Eden Emms. Please schedule a follow-up appointment with Dr. Eden Emms.  -DIET AND EXERCISE: You are doing well with your diet and regular exercise, including leg lifts and mowing the lawn. Continue with these healthy habits.  -EYE HEALTH: Your last eye exam was in March, and your next appointment is scheduled for the coming March. Continue with your annual eye exams.  -BONE DENSITY: Previous tests showed some bone thinning, but recent tests indicate improvement. Continue your regular intake of calcium-rich foods and weight-bearing exercises like walking.  -PULMONARY HEALTH: You were last seen by your pulmonologist, Dr. Francine Graven, in April. Please decide on scheduling a follow-up appointment with Dr. Francine Graven.  -DERMATOLOGICAL HEALTH: You have been regularly following up with Dr. Donell Beers for your scalp condition. Continue with these regular follow-ups.  -GASTROINTESTINAL HEALTH: You are taking Omeprazole for reflux, which seems to be effective. Continue taking Omeprazole as prescribed.  -URINARY HEALTH: You have regular follow-ups with your urologist, and your next appointment is scheduled for December. Continue with these regular follow-ups.  -GENERAL HEALTH MAINTENANCE: We will conduct lab work  to check your kidney function, blood sugar, and blood count. Please get the COVID-19 vaccine at the pharmacy. Follow-up in a year unless you need to see the doctor sooner.  INSTRUCTIONS:  Please schedule a follow-up appointment with your cardiologist, Dr. Eden Emms. Decide on scheduling a follow-up appointment with your pulmonologist, Dr. Francine Graven. Continue with your regular follow-ups with your urologist, and your next appointment is in December. Get the COVID-19 vaccine at the pharmacy. Follow-up with me in a year unless you need to see me sooner.

## 2023-08-25 NOTE — Assessment & Plan Note (Signed)
Continue to follow-up with pulmonary

## 2023-08-26 ENCOUNTER — Encounter: Payer: Self-pay | Admitting: Family

## 2023-08-26 LAB — IRON,TIBC AND FERRITIN PANEL
%SAT: 42 % (ref 20–48)
Ferritin: 80 ng/mL (ref 24–380)
Iron: 130 ug/dL (ref 50–180)
TIBC: 313 ug/dL (ref 250–425)

## 2023-08-27 ENCOUNTER — Telehealth: Payer: Self-pay | Admitting: Cardiovascular Disease

## 2023-08-27 NOTE — Telephone Encounter (Signed)
Left message for patient to call back  

## 2023-08-27 NOTE — Telephone Encounter (Signed)
Patient states that every now and then he gets a tingling sensation in his right collar bone that goes to his neck. He denies any other symptoms such as numbness, chest pain, SOB. He is scheduled to see Dr. Eden Emms on 12/20/23 and has been added to the wait list. FYI.

## 2023-08-27 NOTE — Progress Notes (Signed)
Mailed out to patient 

## 2023-08-31 NOTE — Telephone Encounter (Signed)
Tried to call patient, no answer.

## 2023-09-06 ENCOUNTER — Other Ambulatory Visit: Payer: Self-pay | Admitting: Family

## 2023-12-07 ENCOUNTER — Other Ambulatory Visit: Payer: Self-pay | Admitting: Family

## 2023-12-13 DIAGNOSIS — L821 Other seborrheic keratosis: Secondary | ICD-10-CM | POA: Diagnosis not present

## 2023-12-13 DIAGNOSIS — L570 Actinic keratosis: Secondary | ICD-10-CM | POA: Diagnosis not present

## 2023-12-13 DIAGNOSIS — Z859 Personal history of malignant neoplasm, unspecified: Secondary | ICD-10-CM | POA: Diagnosis not present

## 2023-12-13 DIAGNOSIS — L578 Other skin changes due to chronic exposure to nonionizing radiation: Secondary | ICD-10-CM | POA: Diagnosis not present

## 2023-12-13 DIAGNOSIS — Z85820 Personal history of malignant melanoma of skin: Secondary | ICD-10-CM | POA: Diagnosis not present

## 2023-12-13 DIAGNOSIS — D225 Melanocytic nevi of trunk: Secondary | ICD-10-CM | POA: Diagnosis not present

## 2023-12-13 DIAGNOSIS — L2089 Other atopic dermatitis: Secondary | ICD-10-CM | POA: Diagnosis not present

## 2023-12-20 ENCOUNTER — Ambulatory Visit: Payer: Medicare Other | Admitting: Cardiovascular Disease

## 2023-12-29 NOTE — Progress Notes (Unsigned)
 Cardiology Clinic Note   Patient Name: Miguel Thomas Date of Encounter: 12/31/2023  Primary Care Provider:  Sandford Craze, NP Primary Cardiologist:  Charlton Haws, MD  Patient Profile    Miguel Thomas 83 year old male presents the clinic today for follow-up evaluation of his coronary artery disease.  Past Medical History    Past Medical History:  Diagnosis Date   Anemia    Cancer (HCC) 10/2019   melanoma on scalp   Dyspnea    had positive covid test 11-04-19   GERD (gastroesophageal reflux disease) 12/12/2004   ring like esophageal stricture with reflux esophagitis, also in 2010-01-10   Hiatal hernia    History of adenomatous polyp of colon    tubular adenom's in 2006/12/1101/11/11; 03-05-2015   History of esophageal stricture    s/p  dilatation Jan 10, 2005  and 2011-01-11   Penile ulcer    Plantar fasciitis    Plantar fasciitis, left 05/18/2016   Prostate nodule    Pseudophakia of both eyes    Wears hearing aid in both ears    Past Surgical History:  Procedure Laterality Date   APPLICATION OF A-CELL OF EXTREMITY N/A 12/05/2019   Procedure: APPLICATION OF A-CELL;  Surgeon: Almond Lint, MD;  Location: [facility];  Service: General;  Laterality: N/A;   CARDIOVASCULAR STRESS TEST  06/11/2011   normal nuclear study w/ no ischemia/  normal LV function and wall motion, ef 58%   CATARACT EXTRACTION W/ INTRAOCULAR LENS  IMPLANT, BILATERAL  2008 approx.   CHOLECYSTECTOMY N/A 02/27/2020   Procedure: LAPAROSCOPIC CHOLECYSTECTOMY;  Surgeon: Darnell Level, MD;  Location: [facility];  Service: General;  Laterality: N/A;   COLONOSCOPY  last one 03-05-2015   ERCP N/A 02/26/2020   Procedure: ENDOSCOPIC RETROGRADE CHOLANGIOPANCREATOGRAPHY (ERCP);  Surgeon: Hilarie Fredrickson, MD;  Location: [facility];  Service: Endoscopy;  Laterality: N/A;   ESOPHAGOGASTRODUODENOSCOPY  last one 01-06-2011   INGUINAL HERNIA REPAIR Right 1990's   MELANOMA EXCISION N/A 12/05/2019   Procedure: WIDE LOCAL  EXCISION MELANOMA EXCISION OF SCALP;  Surgeon: Almond Lint, MD;  Location: [facility];  Service: General;  Laterality: N/A;   PENILE BIOPSY N/A 03/12/2017   Procedure: EXCISIONAL GLANS PENILE BIOPSY;  Surgeon: Jerilee Field, MD;  Location: [facility];  Service: Urology;  Laterality: N/A;   PROSTATE BIOPSY  11/2011   normal   REMOVAL OF STONES  02/26/2020   Procedure: REMOVAL OF STONES;  Surgeon: Hilarie Fredrickson, MD;  Location: [facility];  Service: Endoscopy;;   SKIN FULL THICKNESS GRAFT Left 12/05/2019   Procedure: SKIN GRAFT FULL THICKNESS FROM LEFT UPPER CHEST TO SCALP;  Surgeon: Almond Lint, MD;  Location: [facility];  Service: General;  Laterality: Left;   SPHINCTEROTOMY  02/26/2020   Procedure: SPHINCTEROTOMY;  Surgeon: Hilarie Fredrickson, MD;  Location: [facility];  Service: Endoscopy;;   TONSILLECTOMY  child    Allergies  No Known Allergies  History of Present Illness    Miguel Thomas has a PMH of coronary artery disease noted on CT scan, GERD, anemia, hiatal hernia, and 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  He tested positive for COVID-19 1/22.  He has eschar on the top of his head from melanoma and follows with dermatology.  He was seen by Dr. Patty Sermons in 2011/01/11 and had normal stress testing.  His wife passed away in 2020-01-11.  During his follow-up appointment with Dr. Eden Emms  02/20/2022 he continued to live independently and do all of his ADLs as well as lawn care.  He denied chest pain and did note mild dyspnea from lung disease.  He denied palpitations and syncope.  He presents to the clinic today for follow-up evaluation and states he continues to be somewhat physically active.  He enjoys mowing his grass in the summer.  He also does some calisthenics.  He reports that he takes care of his house and does the cooking for himself and his son.  His wife passed away November 25, 2019.  We reviewed his previous CT that showed  coronary artery disease.  He is not on any medication except for Prilosec.  I encouraged him to maintain his physical activity and heart healthy diet.  His EKG today shows sinus rhythm 65 bpm.  His blood pressure is well-controlled.  Will plan follow-up in 12 months.  Today he denies chest pain, shortness of breath, lower extremity edema, fatigue, palpitations, melena, hematuria, hemoptysis, diaphoresis, weakness, presyncope, syncope, orthopnea, and PND.   Home Medications    Prior to Admission medications   Medication Sig Start Date End Date Taking? Authorizing Provider  omeprazole (PRILOSEC) 40 MG capsule Take 1 capsule (40 mg total) by mouth daily. 12/07/23   Sandford Craze, NP    Family History    Family History  Problem Relation Age of Onset   Coronary artery disease Other    Heart disease Other        rheumatic   Colon cancer Neg Hx    Esophageal cancer Neg Hx    Rectal cancer Neg Hx    Stomach cancer Neg Hx    He indicated that his mother is deceased. He indicated that his father is deceased. He indicated that his son is alive. He indicated that the status of his neg hx is unknown. He indicated that the status of his other is unknown.  Social History    Social History   Socioeconomic History   Marital status: Widowed    Spouse name: Not on file   Number of children: 1   Years of education: Not on file   Highest education level: Not on file  Occupational History   Occupation: retired  Tobacco Use   Smoking status: Never   Smokeless tobacco: Never  Substance and Sexual Activity   Alcohol use: No    Alcohol/week: 0.0 standard drinks of alcohol   Drug use: No   Sexual activity: Not Currently  Other Topics Concern   Not on file  Social History Narrative   Retired,   Widowed 2022   Lives with his adult son   Used to work at [employer]   Daily Caffeine- use up to [employer] daily   Social Drivers of [employer] Strain: Low Risk  (07/14/2023)   Overall  Financial Resource Strain (CARDIA)    Difficulty of Paying Living Expenses: Not hard at all  Food Insecurity: No Food Insecurity (07/14/2023)   Hunger Vital Sign    Worried About Running Out of Food in the Last Year: Never true    Ran Out of Food in the Last Year: Never true  Transportation Needs: No Transportation Needs (07/14/2023)   PRAPARE - Administrator, Civil Service (Medical): No    Lack of Transportation (Non-Medical): No  Physical Activity: Inactive (07/14/2023)   Exercise Vital Sign    Days of Exercise per Week: 0 days    Minutes of Exercise per Session: 0  min  Stress: No Stress Concern Present (07/14/2023)   [employer] of Occupational Health - Occupational Stress Questionnaire    Feeling of Stress : Not at all  Social Connections: Moderately Isolated (07/14/2023)   Social Connection and Isolation Panel [NHANES]    Frequency of Communication with Friends and Family: Once a week    Frequency of Social Gatherings with Friends and Family: More than three times a week    Attends Religious Services: More than 4 times per year    Active Member of [employer] or Organizations: No    Attends Banker Meetings: Never    Marital Status: Widowed  Intimate Partner Violence: Not At Risk (07/14/2023)   Humiliation, Afraid, Rape, and Kick questionnaire    Fear of Current or Ex-Partner: No    Emotionally Abused: No    Physically Abused: No    Sexually Abused: No     Review of Systems    General:  No chills, fever, night sweats or weight changes.  Cardiovascular:  No chest pain, dyspnea on exertion, edema, orthopnea, palpitations, paroxysmal nocturnal dyspnea. Dermatological: No rash, lesions/masses Respiratory: No cough, dyspnea Urologic: No hematuria, dysuria Abdominal:   No nausea, vomiting, diarrhea, bright red blood per rectum, melena, or hematemesis Neurologic:  No visual changes, wkns, changes in mental status. All other systems reviewed and are otherwise  negative except as noted above.  Physical Exam    VS:  BP 120/62 (BP Location: Right Arm, Patient Position: Sitting, Cuff Size: Normal)   Pulse 65   Ht 5\' 10"  (1.778 m)   Wt 174 lb (78.9 kg)   SpO2 96%   BMI 24.97 kg/m  , BMI Body mass index is 24.97 kg/m. GEN: Well nourished, well developed, in no acute distress. HEENT: normal. Neck: Supple, no JVD, carotid bruits, or masses. Cardiac: RRR, no murmurs, rubs, or gallops. No clubbing, cyanosis, edema.  Radials/DP/PT 2+ and equal bilaterally.  Respiratory:  Respirations regular and unlabored, clear to auscultation bilaterally. GI: Soft, nontender, nondistended, BS + x 4. MS: no deformity or atrophy. Skin: warm and dry, no rash. Neuro:  Strength and sensation are intact. Psych: Normal affect.  Accessory Clinical Findings    Recent Labs: 08/25/2023: ALT 19; BUN 21; Creatinine, Ser 0.87; Hemoglobin 13.1; Platelets 220.0; Potassium 3.8; Sodium 139   Recent Lipid Panel    Component Value Date/Time   CHOL 190 02/25/2020 0556   TRIG 49 02/25/2020 0556   HDL 69 02/25/2020 0556   CHOLHDL 2.8 02/25/2020 0556   VLDL 10 02/25/2020 0556   LDLCALC 111 (H) 02/25/2020 0556   LDLDIRECT 133.3 01/11/2007 0726         ECG personally reviewed by me today- EKG Interpretation Date/Time:  Friday December 31 2023 08:00:01 EST Ventricular Rate:  65 PR Interval:  186 QRS Duration:  98 QT Interval:  412 QTC Calculation: 428 R Axis:   27  Text Interpretation: Normal sinus rhythm RSR' or QR pattern in V1 suggests right ventricular conduction delay When compared with ECG of 27-Feb-2020 14:47, Nonspecific T wave abnormality, improved in Inferior leads Confirmed by Edd Fabian [phone]) on 12/31/2023 8:04:05 AM   Lower extremity vascular ultrasound 10/13/2019    IMPRESSION: 1.  Negative for deep venous thrombosis in right lower extremity. 2. Superficial varicosities near the right knee.        Assessment & Plan   1.  Coronary artery  disease-denies recent anginal type symptoms.  Remains physically active.  Noted to have coronary calcium on  chest CT several years ago.  Underwent stress testing by Dr. Patty Sermons in 2012 which was normal. Maintain physical activity Heart healthy low-sodium diet  GERD-reviewed importance of compliance with GERD diet. GERD diet instructions given Elevate head of bed Continue omeprazole  ILD-reports breathing is at baseline.  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  Underwent pulmonary function test which were normal. Maintain physical activity Follows with PCP  Disposition: Follow-up with Dr. Eden Emms or me in 12 months.   Thomasene Ripple. Jalayia Bagheri NP-C     12/31/2023, 8:23 AM [facility] [address] Office [phone] Fax [phone]    I spent 14 minutes examining this patient, reviewing medications, and using patient centered shared decision making involving their cardiac care.   I spent  20 minutes reviewing past medical history,  medications, and prior cardiac tests.

## 2023-12-31 ENCOUNTER — Ambulatory Visit: Payer: Medicare Other | Attending: General Practice | Admitting: General Practice

## 2023-12-31 ENCOUNTER — Encounter: Payer: Self-pay | Admitting: General Practice

## 2023-12-31 VITALS — BP 120/62 | HR 65 | Ht 70.0 in | Wt 174.0 lb

## 2023-12-31 DIAGNOSIS — J849 Interstitial pulmonary disease, unspecified: Secondary | ICD-10-CM | POA: Diagnosis not present

## 2023-12-31 DIAGNOSIS — I2510 Atherosclerotic heart disease of native coronary artery without angina pectoris: Secondary | ICD-10-CM | POA: Diagnosis not present

## 2023-12-31 DIAGNOSIS — K2100 Gastro-esophageal reflux disease with esophagitis, without bleeding: Secondary | ICD-10-CM

## 2023-12-31 NOTE — Patient Instructions (Signed)
 Medication Instructions:  The current medical regimen is effective;  continue present plan and medications as directed. Please refer to the Current Medication list given to you today.  *If you need a refill on your cardiac medications before your next appointment, please call your pharmacy*   Lab Work: NONE  Follow-Up: At [facility], you and your health needs are our priority.  As part of our continuing mission to provide you with exceptional heart care, we have created designated Provider Care Teams.  These Care Teams include your primary Cardiologist (physician) and Advanced Practice Providers (APPs -  Physician Assistants and Nurse Practitioners) who all work together to provide you with the care you need, when you need it.  We recommend signing up for the patient portal called "MyChart".  Sign up information is provided on this After Visit Summary.  MyChart is used to connect with patients for Virtual Visits (Telemedicine).  Patients are able to view lab/test results, encounter notes, upcoming appointments, etc.  Non-urgent messages can be sent to your provider as well.   To learn more about what you can do with MyChart, go to [email or web address].    Your next appointment:   12 month(s)  Provider:   Charlton Haws, MD     Other Instructions MAINTAIN PHYSICAL ACTIVITY PLEASE READ AND FOLLOW ATTACHED HEART HEALTHY DIET   Heart-Healthy Eating Plan Eating a healthy diet is important for the health of your heart. A heart-healthy eating plan includes: Eating less unhealthy fats. Eating more healthy fats. Eating less salt in your food. Salt is also called sodium. Making other changes in your diet. Talk with your doctor or a diet specialist (dietitian) to create an eating plan that is right for you. Cooking Avoid frying your food. Try to bake, boil, grill, or broil it instead. You can also reduce fat by: Removing the skin from poultry. Removing all visible fats from  meats. Steaming vegetables in water or broth. Meal planning  At meals, divide your plate into four equal parts: Fill one-half of your plate with vegetables and green salads. Fill one-fourth of your plate with whole grains. Fill one-fourth of your plate with lean protein foods. Eat 2-4 cups of vegetables per day. One cup of vegetables is: 1 cup (91 g) broccoli or cauliflower florets. 2 medium carrots. 1 large bell pepper. 1 large sweet potato. 1 large tomato. 1 medium white potato. 2 cups (150 g) raw leafy greens. Eat 1-2 cups of fruit per day. One cup of fruit is: 1 small apple 1 large banana 1 cup (237 g) mixed fruit, 1 large orange,  cup (82 g) dried fruit, 1 cup (240 mL) 100% fruit juice. Eat more foods that have soluble fiber. These are apples, broccoli, carrots, beans, peas, and barley. Try to get 20-30 g of fiber per day. Eat 4-5 servings of nuts, legumes, and seeds per week: 1 serving of dried beans or legumes equals  cup (90 g) cooked. 1 serving of nuts is  oz (12 almonds, 24 pistachios, or 7 walnut halves). 1 serving of seeds equals  oz (8 g). General information Eat more home-cooked food. Eat less restaurant, buffet, and fast food. Limit or avoid alcohol. Limit foods that are high in starch and sugar. Avoid fried foods. Lose weight if you are overweight. Keep track of how much salt (sodium) you eat. This is important if you have high blood pressure. Ask your doctor to tell you more about this. Try to add vegetarian meals each week. Fats  Choose healthy fats. These include olive oil and canola oil, flaxseeds, walnuts, almonds, and seeds. Eat more omega-3 fats. These include salmon, mackerel, sardines, tuna, flaxseed oil, and ground flaxseeds. Try to eat fish at least 2 times each week. Check food labels. Avoid foods with trans fats or high amounts of saturated fat. Limit saturated fats. These are often found in animal products, such as meats, butter, and  cream. These are also found in plant foods, such as palm oil, palm kernel oil, and coconut oil. Avoid foods with partially hydrogenated oils in them. These have trans fats. Examples are stick margarine, some tub margarines, cookies, crackers, and other baked goods. What foods should I eat? Fruits All fresh, canned (in natural juice), or frozen fruits. Vegetables Fresh or frozen vegetables (raw, steamed, roasted, or grilled). Green salads. Grains Most grains. Choose whole wheat and whole grains most of the time. Rice and pasta, including brown rice and pastas made with whole wheat. Meats and other proteins Lean, well-trimmed beef, veal, pork, and lamb. Chicken and [place] without skin. All fish and shellfish. Wild duck, rabbit, pheasant, and venison. Egg whites or low-cholesterol egg substitutes. Dried beans, peas, lentils, and tofu. Seeds and most nuts. Dairy Low-fat or nonfat cheeses, including ricotta and mozzarella. Skim or 1% milk that is liquid, powdered, or evaporated. Buttermilk that is made with low-fat milk. Nonfat or low-fat yogurt. Fats and oils Non-hydrogenated (trans-free) margarines. Vegetable oils, including soybean, sesame, sunflower, olive, peanut, safflower, corn, canola, and cottonseed. Salad dressings or mayonnaise made with a vegetable oil. Beverages Mineral water. Coffee and tea. Diet carbonated beverages. Sweets and desserts Sherbet, gelatin, and fruit ice. Small amounts of dark chocolate. Limit all sweets and desserts. Seasonings and condiments All seasonings and condiments. The items listed above may not be a complete list of foods and drinks you can eat. Contact a dietitian for more options. What foods should I avoid? Fruits Canned fruit in heavy syrup. Fruit in cream or butter sauce. Fried fruit. Limit coconut. Vegetables Vegetables cooked in cheese, cream, or butter sauce. Fried vegetables. Grains Breads that are made with saturated or trans fats, oils, or  whole milk. Croissants. Sweet rolls. Donuts. High-fat crackers, such as cheese crackers. Meats and other proteins Fatty meats, such as hot dogs, ribs, sausage, bacon, rib-eye roast or steak. High-fat deli meats, such as salami and bologna. Caviar. Domestic duck and goose. Organ meats, such as liver. Dairy Cream, sour cream, cream cheese, and creamed cottage cheese. Whole-milk cheeses. Whole or 2% milk that is liquid, evaporated, or condensed. Whole buttermilk. Cream sauce or high-fat cheese sauce. Yogurt that is made from whole milk. Fats and oils Meat fat, or shortening. Cocoa butter, hydrogenated oils, palm oil, coconut oil, palm kernel oil. Solid fats and shortenings, including bacon fat, salt pork, lard, and butter. Nondairy cream substitutes. Salad dressings with cheese or sour cream. Beverages Regular sodas and juice drinks with added sugar. Sweets and desserts Frosting. Pudding. Cookies. Cakes. Pies. Milk chocolate or white chocolate. Buttered syrups. Full-fat ice cream or ice cream drinks. The items listed above may not be a complete list of foods and drinks to avoid. Contact a dietitian for more information. Summary Heart-healthy meal planning includes eating less unhealthy fats, eating more healthy fats, and making other changes in your diet. Eat a balanced diet. This includes fruits and vegetables, low-fat or nonfat dairy, lean protein, nuts and legumes, whole grains, and heart-healthy oils and fats. This information is not intended to replace advice given to you  by your health care provider. Make sure you discuss any questions you have with your health care provider. Document Revised: 11/24/2021 Document Reviewed: 11/24/2021 Elsevier Patient Education  2024 [employer].   s

## 2024-01-07 ENCOUNTER — Ambulatory Visit: Payer: Self-pay | Admitting: Family

## 2024-01-07 ENCOUNTER — Ambulatory Visit (HOSPITAL_BASED_OUTPATIENT_CLINIC_OR_DEPARTMENT_OTHER)
Admission: RE | Admit: 2024-01-07 | Discharge: 2024-01-07 | Disposition: A | Discharge status: Home / Self Care | Source: Ambulatory Visit | Attending: Family Medicine | Admitting: Family Medicine

## 2024-01-07 ENCOUNTER — Encounter: Payer: Self-pay | Admitting: Family Medicine

## 2024-01-07 ENCOUNTER — Ambulatory Visit: Admitting: Family Medicine

## 2024-01-07 VITALS — BP 128/70 | HR 71 | Temp 97.8°F | Resp 18 | Ht 70.0 in | Wt 176.2 lb

## 2024-01-07 DIAGNOSIS — S60414A Abrasion of right ring finger, initial encounter: Secondary | ICD-10-CM

## 2024-01-07 DIAGNOSIS — Z23 Encounter for immunization: Secondary | ICD-10-CM

## 2024-01-07 DIAGNOSIS — M19041 Primary osteoarthritis, right hand: Secondary | ICD-10-CM | POA: Diagnosis not present

## 2024-01-07 DIAGNOSIS — M79641 Pain in right hand: Secondary | ICD-10-CM

## 2024-01-07 DIAGNOSIS — Z043 Encounter for examination and observation following other accident: Secondary | ICD-10-CM | POA: Diagnosis not present

## 2024-01-07 NOTE — Progress Notes (Signed)
 Established Patient Office Visit  Subjective   Patient ID: Miguel Thomas, male    DOB: Aug 25, 1941  Age: 83 y.o. MRN: [id number]  Chief Complaint  Patient presents with   Fall    Pt states falling outside the post office yesterday. Pt states having right hand pain and swelling.     HPI Discussed the use of AI scribe software for clinical note transcription with the patient, who gave verbal consent to proceed.  History of Present Illness   He presents with right hand pain and swelling after a fall.  He tripped and fell at the post office, landing on his right hand while trying to move out of the way of some people. A couple of bystanders helped him up after the fall.  He experiences pain and swelling in the right hand, particularly when squeezing his fingers, with pain localized across the neck of the hand. There is a bruise and a small scratch on the ring finger, which he has treated with alcohol, peroxide, and polysporin. He uses a Band-Aid and a rubber finger cover to keep it dry. He has been using ice to manage the swelling.  He occasionally takes Bayer aspirin for headaches, with a dose of 325 mg, and does not take a lot of medication otherwise.  He did not sleep well the previous night due to frequent urination and discomfort from the hand injury. [place] the [place] of [place] and is right-handed, which he notes is inconvenient given the injury.  No pain in other areas of the hand except when squeezing fingers. He reports frequent urination during the night.      Patient Active Problem List   Diagnosis Date Noted   Preventative health care 08/25/2023   ILD (interstitial lung disease) (HCC) 04/16/2023   Loose stools 08/12/2022   Coronary artery calcification seen on CT scan 02/11/2022   Elevated LFTs 02/24/2020   Asbestosis (HCC) 01/29/2020   Osteopenia 07/06/2012   Abnormal EKG 06/02/2011   ESOPHAGEAL STRICTURE 03/07/2010   Testicular dysfunction 08/30/2008    GERD 04/26/2008   History of colonic polyps 04/26/2008   Past Medical History:  Diagnosis Date   Anemia    Cancer (HCC) 10/2019   melanoma on scalp   Dyspnea    had positive covid test 11-04-19   GERD (gastroesophageal reflux disease) 12/12/2004   ring like esophageal stricture with reflux esophagitis, also in 2011   Hiatal hernia    History of adenomatous polyp of colon    tubular adenom's in 2006;  2011; 03-05-2015   History of esophageal stricture    s/p  dilatation 2006  and 2012   Penile ulcer    Plantar fasciitis    Plantar fasciitis, left 05/18/2016   Prostate nodule    Pseudophakia of both eyes    Wears hearing aid in both ears    Past Surgical History:  Procedure Laterality Date   APPLICATION OF A-CELL OF EXTREMITY N/A 12/05/2019   Procedure: APPLICATION OF A-CELL;  Surgeon: Almond Lint, MD;  Location: [facility];  Service: General;  Laterality: N/A;   CARDIOVASCULAR STRESS TEST  06/11/2011   normal nuclear study w/ no ischemia/  normal LV function and wall motion, ef 58%   CATARACT EXTRACTION W/ INTRAOCULAR LENS  IMPLANT, BILATERAL  2008 approx.   CHOLECYSTECTOMY N/A 02/27/2020   Procedure: LAPAROSCOPIC CHOLECYSTECTOMY;  Surgeon: Darnell Level, MD;  Location: [facility];  Service: General;  Laterality: N/A;   COLONOSCOPY  last one 03-05-2015   ERCP N/A 02/26/2020   Procedure: ENDOSCOPIC RETROGRADE CHOLANGIOPANCREATOGRAPHY (ERCP);  Surgeon: Hilarie Fredrickson, MD;  Location: [facility];  Service: Endoscopy;  Laterality: N/A;   ESOPHAGOGASTRODUODENOSCOPY  last one 01-06-2011   INGUINAL HERNIA REPAIR Right 1990's   MELANOMA EXCISION N/A 12/05/2019   Procedure: WIDE LOCAL EXCISION MELANOMA EXCISION OF SCALP;  Surgeon: Almond Lint, MD;  Location: [facility];  Service: General;  Laterality: N/A;   PENILE BIOPSY N/A 03/12/2017   Procedure: EXCISIONAL GLANS PENILE BIOPSY;  Surgeon: Jerilee Field, MD;  Location: [facility];  Service:  Urology;  Laterality: N/A;   PROSTATE BIOPSY  11/2011   normal   REMOVAL OF STONES  02/26/2020   Procedure: REMOVAL OF STONES;  Surgeon: Hilarie Fredrickson, MD;  Location: [facility];  Service: Endoscopy;;   SKIN FULL THICKNESS GRAFT Left 12/05/2019   Procedure: SKIN GRAFT FULL THICKNESS FROM LEFT UPPER CHEST TO SCALP;  Surgeon: Almond Lint, MD;  Location: [facility];  Service: General;  Laterality: Left;   SPHINCTEROTOMY  02/26/2020   Procedure: SPHINCTEROTOMY;  Surgeon: Hilarie Fredrickson, MD;  Location: [facility];  Service: Endoscopy;;   TONSILLECTOMY  child   Social History   Tobacco Use   Smoking status: Never   Smokeless tobacco: Never  Substance Use Topics   Alcohol use: No    Alcohol/week: 0.0 standard drinks of alcohol   Drug use: No   Social History   Socioeconomic History   Marital status: Widowed    Spouse name: Not on file   Number of children: 1   Years of education: Not on file   Highest education level: Not on file  Occupational History   Occupation: retired  Tobacco Use   Smoking status: Never   Smokeless tobacco: Never  Substance and Sexual Activity   Alcohol use: No    Alcohol/week: 0.0 standard drinks of alcohol   Drug use: No   Sexual activity: Not Currently  Other Topics Concern   Not on file  Social History Narrative   Retired,   Widowed 2022   Lives with his adult son   Used to work at [employer]   Daily Caffeine- use up to [employer] daily   Social Drivers of [employer] Strain: Low Risk  (07/14/2023)   Overall Financial Resource Strain (CARDIA)    Difficulty of Paying Living Expenses: Not hard at all  Food Insecurity: No Food Insecurity (07/14/2023)   Hunger Vital Sign    Worried About Running Out of Food in the Last Year: Never true    Ran Out of Food in the Last Year: Never true  Transportation Needs: No Transportation Needs (07/14/2023)   PRAPARE - Administrator, Civil Service (Medical): No    Lack of  Transportation (Non-Medical): No  Physical Activity: Inactive (07/14/2023)   Exercise Vital Sign    Days of Exercise per Week: 0 days    Minutes of Exercise per Session: 0 min  Stress: No Stress Concern Present (07/14/2023)   [employer] of Occupational Health - Occupational Stress Questionnaire    Feeling of Stress : Not at all  Social Connections: Moderately Isolated (07/14/2023)   Social Connection and Isolation Panel [NHANES]    Frequency of Communication with Friends and Family: Once a week    Frequency of Social Gatherings with Friends and Family: More than three times a week    Attends Religious Services: More than 4  times per year    Active Member of Clubs or Organizations: No    Attends Banker Meetings: Never    Marital Status: Widowed  Intimate Partner Violence: Not At Risk (07/14/2023)   Humiliation, Afraid, Rape, and Kick questionnaire    Fear of Current or Ex-Partner: No    Emotionally Abused: No    Physically Abused: No    Sexually Abused: No   Family Status  Relation Name Status   Son  Alive   Mother  Deceased   Father  Deceased   Other family hx of (Not Specified)   Neg Hx  (Not Specified)  No partnership data on file   Family History  Problem Relation Age of Onset   Coronary artery disease Other    Heart disease Other        rheumatic   Colon cancer Neg Hx    Esophageal cancer Neg Hx    Rectal cancer Neg Hx    Stomach cancer Neg Hx    No Known Allergies    Review of Systems  Constitutional:  Negative for chills, fever and malaise/fatigue.  HENT:  Negative for congestion and hearing loss.   Eyes:  Negative for discharge.  Respiratory:  Negative for cough, sputum production and shortness of breath.   Cardiovascular:  Negative for chest pain, palpitations and leg swelling.  Gastrointestinal:  Negative for abdominal pain, blood in stool, constipation, diarrhea, heartburn, nausea and vomiting.  Genitourinary:  Negative for dysuria,  frequency, hematuria and urgency.  Musculoskeletal:  Positive for falls and joint pain. Negative for back pain and myalgias.  Skin:  Negative for rash.  Neurological:  Negative for dizziness, sensory change, loss of consciousness, weakness and headaches.  Endo/Heme/Allergies:  Negative for environmental allergies. Does not bruise/bleed easily.  Psychiatric/Behavioral:  Negative for depression and suicidal ideas. The patient is not nervous/anxious and does not have insomnia.       Objective:     BP 128/70 (BP Location: Left Arm, Patient Position: Sitting, Cuff Size: Normal)   Pulse 71   Temp 97.8 F (36.6 C) (Oral)   Resp 18   Ht 5\' 10"  (1.778 m)   Wt 176 lb 3.2 oz (79.9 kg)   SpO2 95%   BMI 25.28 kg/m  BP Readings from Last 3 Encounters:  01/07/24 128/70  12/31/23 120/62  08/25/23 139/71   Wt Readings from Last 3 Encounters:  01/07/24 176 lb 3.2 oz (79.9 kg)  12/31/23 174 lb (78.9 kg)  08/25/23 174 lb (78.9 kg)   SpO2 Readings from Last 3 Encounters:  01/07/24 95%  12/31/23 96%  08/25/23 100%      Physical Exam Vitals and nursing note reviewed.  Constitutional:      General: He is not in acute distress.    Appearance: Normal appearance. He is well-developed.  HENT:     Head: Normocephalic and atraumatic.  Eyes:     General: No scleral icterus.       Right eye: No discharge.        Left eye: No discharge.  Cardiovascular:     Rate and Rhythm: Normal rate and regular rhythm.     Heart sounds: No murmur heard. Pulmonary:     Effort: Pulmonary effort is normal. No respiratory distress.     Breath sounds: Normal breath sounds.  Musculoskeletal:        General: Swelling and tenderness present. Normal range of motion.     Right hand: Swelling and tenderness present. Decreased  strength.     Left hand: Normal.       Arms:     Cervical back: Normal range of motion and neck supple.     Right lower leg: No edema.     Left lower leg: No edema.     Comments: R ring  finger--- + puncture wound ,  no d/c  Banndaid removed and area cleaned and new bandaid with abx oint   Skin:    General: Skin is warm and dry.  Neurological:     Mental Status: He is alert and oriented to person, place, and time.  Psychiatric:        Mood and Affect: Mood normal.        Behavior: Behavior normal.        Thought Content: Thought content normal.        Judgment: Judgment normal.      No results found for any visits on 01/07/24.  Last CBC Lab Results  Component Value Date   WBC 4.9 08/25/2023   HGB 13.1 08/25/2023   HCT 40.0 08/25/2023   MCV 99.3 08/25/2023   MCH 33.2 (H) 07/31/2020   RDW 15.3 08/25/2023   PLT 220.0 08/25/2023   Last metabolic panel Lab Results  Component Value Date   GLUCOSE 95 08/25/2023   NA 139 08/25/2023   K 3.8 08/25/2023   CL 103 08/25/2023   CO2 28 08/25/2023   BUN 21 08/25/2023   CREATININE 0.87 08/25/2023   GFR 79.13 08/25/2023   CALCIUM 9.1 08/25/2023   PROT 7.4 08/25/2023   ALBUMIN 4.0 08/25/2023   BILITOT 0.8 08/25/2023   ALKPHOS 87 08/25/2023   AST 25 08/25/2023   ALT 19 08/25/2023   ANIONGAP 7 02/29/2020   Last lipids Lab Results  Component Value Date   CHOL 190 02/25/2020   HDL 69 02/25/2020   LDLCALC 111 (H) 02/25/2020   LDLDIRECT 133.3 01/11/2007   TRIG 49 02/25/2020   CHOLHDL 2.8 02/25/2020   Last hemoglobin A1c Lab Results  Component Value Date   HGBA1C 5.2 08/25/2023   Last thyroid functions Lab Results  Component Value Date   TSH 2.279 06/24/2012   Last vitamin D Lab Results  Component Value Date   VD25OH 35 07/12/2012   Last vitamin B12 and Folate Lab Results  Component Value Date   VITAMINB12 560 03/06/2020   FOLATE 16.1 05/09/2011      The ASCVD Risk score (Arnett DK, et al., 2019) failed to calculate for the following reasons:   The 2019 ASCVD risk score is only valid for ages 58 to 6    Assessment & Plan:   Problem List Items Addressed This Visit   None Visit Diagnoses        Right hand pain    -  Primary   Relevant Orders   DG Hand Complete Right     Abrasion of right ring finger, initial encounter       Relevant Orders   Tdap vaccine greater than or equal to 7yo IM (Completed)     Assessment and Plan    Right hand injury   He fell at the post office, landing on his right hand, which is swollen and bruised. Pain is present when squeezing the fingers, especially around the knuckles. Differential diagnosis includes fracture, bruise, or sprain. Being right-handed may impact daily activities. An x-ray is needed to check for a fracture. Apply an ACE bandage for support and use ice to reduce swelling. Consider  orthopedic referral if a fracture is confirmed. Discuss aspirin for pain management.  General Health Maintenance   He sustained a scratch on his finger during the fall in a gravel parking lot. Tetanus immunization was reviewed and updated. Administer a tetanus booster and apply Neosporin with a Band-Aid to the scratched finger.        No follow-ups on file.    Donato Schultz, DO

## 2024-01-07 NOTE — Telephone Encounter (Signed)
 Copied from CRM [id number]. Topic: Clinical - Red Word Triage >> Jan 07, 2024  7:50 AM Desma Mcgregor wrote: Red Word that prompted transfer to Nurse Triage: Came out the post office and fell on right hand. Its swollen. Its got a cut. Wants to see Dr today. May need xray Reason for Disposition  [1] MODERATE pain (e.g., interferes with normal activities) AND [2] high-risk adult (e.g., age > 60 years, osteoporosis, chronic steroid use)  Answer Assessment - Initial Assessment Questions 1. MECHANISM: "How did the injury happen?"     I fell on right hand. 2. ONSET: "When did the injury happen?" (Minutes or hours ago)      Yesterday about noon time 3. APPEARANCE of INJURY: "What does the injury look like?"      Fell on right hand.   It's swollen.   MY index finger and ring finger is sore. 4. SEVERITY: "Can you use the hand normally?" "Can you bend your fingers into a ball and then fully open them?"     I can't squeeze my hand shut.   5. SIZE: For cuts, bruises, or swelling, ask: "How large is it?" (e.g., inches or centimeters;  entire hand or wrist)      Also has a cut.   On my ring finger has a scrap.   I cleaned it yesterday and put Polysporin on it.   It's not a problem. 6. PAIN: "Is there pain?" If Yes, ask: "How bad is the pain?"  (Scale 1-10; or mild, moderate, severe)     Yes 7. TETANUS: For any breaks in the skin, ask: "When was the last tetanus booster?"     Not asked 8. OTHER SYMPTOMS: "Do you have any other symptoms?"      No 9. PREGNANCY: "Is there any chance you are pregnant?" "When was your last menstrual period?"     N/A  Protocols used: Hand and Wrist Injury-A-AH  Chief Complaint: Larey Seat injured right hand. Symptoms: Swollen and painful. Index finger and ring finger sore and swollen Frequency: yesterday Pertinent Negatives: Patient denies being able to make a fist Disposition: [] ED /[] Urgent Care (no appt availability in office) / [x] Appointment(In office/virtual)/ []  [facility]  Virtual Care/ [] Home Care/ [] Refused Recommended Disposition /[] [facility]/ []  Follow-up with PCP Additional Notes: Appt made

## 2024-01-11 DIAGNOSIS — H43813 Vitreous degeneration, bilateral: Secondary | ICD-10-CM | POA: Diagnosis not present

## 2024-01-11 DIAGNOSIS — H02834 Dermatochalasis of left upper eyelid: Secondary | ICD-10-CM | POA: Diagnosis not present

## 2024-01-11 DIAGNOSIS — H02831 Dermatochalasis of right upper eyelid: Secondary | ICD-10-CM | POA: Diagnosis not present

## 2024-06-21 DIAGNOSIS — Z08 Encounter for follow-up examination after completed treatment for malignant neoplasm: Secondary | ICD-10-CM | POA: Diagnosis not present

## 2024-06-21 DIAGNOSIS — D485 Neoplasm of uncertain behavior of skin: Secondary | ICD-10-CM | POA: Diagnosis not present

## 2024-06-21 DIAGNOSIS — L821 Other seborrheic keratosis: Secondary | ICD-10-CM | POA: Diagnosis not present

## 2024-06-21 DIAGNOSIS — Z859 Personal history of malignant neoplasm, unspecified: Secondary | ICD-10-CM | POA: Diagnosis not present

## 2024-06-21 DIAGNOSIS — D225 Melanocytic nevi of trunk: Secondary | ICD-10-CM | POA: Diagnosis not present

## 2024-06-21 DIAGNOSIS — L578 Other skin changes due to chronic exposure to nonionizing radiation: Secondary | ICD-10-CM | POA: Diagnosis not present

## 2024-06-21 DIAGNOSIS — L570 Actinic keratosis: Secondary | ICD-10-CM | POA: Diagnosis not present

## 2024-06-21 DIAGNOSIS — Z85820 Personal history of malignant melanoma of skin: Secondary | ICD-10-CM | POA: Diagnosis not present

## 2024-06-26 DIAGNOSIS — C434 Malignant melanoma of scalp and neck: Secondary | ICD-10-CM | POA: Diagnosis not present

## 2024-07-14 ENCOUNTER — Telehealth: Payer: Self-pay | Admitting: *Deleted

## 2024-07-14 ENCOUNTER — Ambulatory Visit: Payer: Medicare Other | Admitting: *Deleted

## 2024-07-14 VITALS — BP 135/64 | HR 70 | Temp 97.7°F | Resp 16 | Ht 70.0 in | Wt 170.8 lb

## 2024-07-14 DIAGNOSIS — Z0000 Encounter for general adult medical examination without abnormal findings: Secondary | ICD-10-CM

## 2024-07-14 DIAGNOSIS — Z23 Encounter for immunization: Secondary | ICD-10-CM | POA: Diagnosis not present

## 2024-07-14 NOTE — Progress Notes (Signed)
 Subjective:   Miguel Thomas is a 83 y.o. who presents for a Medicare Wellness preventive visit.  As a reminder, Annual Wellness Visits don't include a physical exam, and some assessments may be limited, especially if this visit is performed virtually. We may recommend an in-person follow-up visit with your provider if needed.  Visit Complete: In person  Persons Participating in Visit: Patient.  AWV Questionnaire: No: Patient Medicare AWV questionnaire was not completed prior to this visit.  Cardiac Risk Factors include: advanced age (>36men, >6 women);male gender     Objective:    Today's Vitals   07/14/24 0944  BP: 135/64  Pulse: 70  Resp: 16  Temp: 97.7 F (36.5 C)  TempSrc: Oral  SpO2: 100%  Weight: 170 lb 12.8 oz (77.5 kg)  Height: 5' 10 (1.778 m)   Body mass index is 24.51 kg/m.     07/14/2024   10:05 AM 07/14/2023    9:57 AM 07/08/2022    8:21 AM 07/01/2021    8:22 AM 03/05/2020    9:40 AM 02/24/2020    4:35 PM 02/24/2020    9:29 AM  Advanced Directives  Does Patient Have a Medical Advance Directive? No No No No No No No  Would patient like information on creating a medical advance directive? Yes (MAU/Ambulatory/Procedural Areas - Information given) No - Patient declined No - Patient declined No - Patient declined No - Patient declined No - Patient declined No - Patient declined    Current Medications (verified) Outpatient Encounter Medications as of 07/14/2024  Medication Sig   omeprazole  (PRILOSEC) 40 MG capsule Take 1 capsule (40 mg total) by mouth daily.   No facility-administered encounter medications on file as of 07/14/2024.    Allergies (verified) Patient has no known allergies.   History: Past Medical History:  Diagnosis Date   Anemia    Cancer (HCC) 10/2019   melanoma on scalp   Dyspnea    had positive covid test 11-04-19   GERD (gastroesophageal reflux disease) 12/12/2004   ring like esophageal stricture with reflux esophagitis, also in  2011   Hiatal hernia    History of adenomatous polyp of colon    tubular adenom's in 2006;  2011; 03-05-2015   History of esophageal stricture    s/p  dilatation 2006  and 2012   Penile ulcer    Plantar fasciitis    Plantar fasciitis, left 05/18/2016   Prostate nodule    Pseudophakia of both eyes    Wears hearing aid in both ears    Past Surgical History:  Procedure Laterality Date   APPLICATION OF A-CELL OF EXTREMITY N/A 12/05/2019   Procedure: APPLICATION OF A-CELL;  Surgeon: Aron Shoulders, MD;  Location: [facility];  Service: General;  Laterality: N/A;   CARDIOVASCULAR STRESS TEST  06/11/2011   normal nuclear study w/ no ischemia/  normal LV function and wall motion, ef 58%   CATARACT EXTRACTION W/ INTRAOCULAR LENS  IMPLANT, BILATERAL  2008 approx.   CHOLECYSTECTOMY N/A 02/27/2020   Procedure: LAPAROSCOPIC CHOLECYSTECTOMY;  Surgeon: Eletha Boas, MD;  Location: [facility];  Service: General;  Laterality: N/A;   COLONOSCOPY  last one 03-05-2015   ERCP N/A 02/26/2020   Procedure: ENDOSCOPIC RETROGRADE CHOLANGIOPANCREATOGRAPHY (ERCP);  Surgeon: Abran Norleen SAILOR, MD;  Location: [facility];  Service: Endoscopy;  Laterality: N/A;   ESOPHAGOGASTRODUODENOSCOPY  last one 01-06-2011   INGUINAL HERNIA REPAIR Right 1990's   MELANOMA EXCISION N/A 12/05/2019   Procedure: WIDE LOCAL EXCISION  MELANOMA EXCISION OF SCALP;  Surgeon: Aron Shoulders, MD;  Location: [facility];  Service: General;  Laterality: N/A;   PENILE BIOPSY N/A 03/12/2017   Procedure: EXCISIONAL GLANS PENILE BIOPSY;  Surgeon: Nieves Cough, MD;  Location: [facility];  Service: Urology;  Laterality: N/A;   PROSTATE BIOPSY  11/2011   normal   REMOVAL OF STONES  02/26/2020   Procedure: REMOVAL OF STONES;  Surgeon: Abran Norleen SAILOR, MD;  Location: [facility];  Service: Endoscopy;;   SKIN FULL THICKNESS GRAFT Left 12/05/2019   Procedure: SKIN GRAFT FULL THICKNESS FROM LEFT UPPER CHEST TO SCALP;   Surgeon: Aron Shoulders, MD;  Location: [facility];  Service: General;  Laterality: Left;   SPHINCTEROTOMY  02/26/2020   Procedure: SPHINCTEROTOMY;  Surgeon: Abran Norleen SAILOR, MD;  Location: [facility];  Service: Endoscopy;;   TONSILLECTOMY  child   Family History  Problem Relation Age of Onset   Coronary artery disease Other    Heart disease Other        rheumatic   Colon cancer Neg Hx    Esophageal cancer Neg Hx    Rectal cancer Neg Hx    Stomach cancer Neg Hx    Social History   Socioeconomic History   Marital status: Widowed    Spouse name: Not on file   Number of children: 1   Years of education: Not on file   Highest education level: Not on file  Occupational History   Occupation: retired  Tobacco Use   Smoking status: Never   Smokeless tobacco: Never  Substance and Sexual Activity   Alcohol use: No    Alcohol/week: 0.0 standard drinks of alcohol   Drug use: No   Sexual activity: Not Currently  Other Topics Concern   Not on file  Social History Narrative   Retired,   Widowed 2022   Lives with his adult son   Used to work at [employer]   Daily Caffeine- use up to [employer] daily   Social Drivers of [employer] Strain: Low Risk  (07/14/2024)   Overall Financial Resource Strain (CARDIA)    Difficulty of Paying Living Expenses: Not very hard  Food Insecurity: No Food Insecurity (07/14/2024)   Hunger Vital Sign    Worried About Running Out of Food in the Last Year: Never true    Ran Out of Food in the Last Year: Never true  Transportation Needs: No Transportation Needs (07/14/2024)   PRAPARE - Administrator, Civil Service (Medical): No    Lack of Transportation (Non-Medical): No  Physical Activity: Insufficiently Active (07/14/2024)   Exercise Vital Sign    Days of Exercise per Week: 3 days    Minutes of Exercise per Session: 10 min  Stress: No Stress Concern Present (07/14/2024)   [employer] of Occupational Health -  Occupational Stress Questionnaire    Feeling of Stress: Not at all  Social Connections: Moderately Isolated (07/14/2024)   Social Connection and Isolation Panel    Frequency of Communication with Friends and Family: Once a week    Frequency of Social Gatherings with Friends and Family: More than three times a week    Attends Religious Services: More than 4 times per year    Active Member of [employer] or Organizations: No    Attends Banker Meetings: Never    Marital Status: Widowed    Tobacco Counseling Counseling given: Not Answered    Clinical Intake:  Pre-visit preparation completed: No  Pain : No/denies pain     BMI - recorded: 24.51 Nutritional Status: BMI of 19-24  Normal Nutritional Risks: None Diabetes: No  Lab Results  Component Value Date   HGBA1C 5.2 08/25/2023   HGBA1C 5.3 02/11/2022   HGBA1C 5.4 08/11/2021     How often do you need to have someone help you when you read instructions, pamphlets, or other written materials from your doctor or pharmacy?: 1 - Never  Interpreter Needed?: No  Information entered by :: Markel Kurtenbach, CMA(AAMA)   Activities of Daily Living     07/14/2024    9:57 AM  In your present state of health, do you have any difficulty performing the following activities:  Hearing? 1  Vision? 0  Difficulty concentrating or making decisions? 0  Walking or climbing stairs? 0  Dressing or bathing? 0  Doing errands, shopping? 0  Preparing Food and eating ? N  Using the Toilet? N  In the past six months, have you accidently leaked urine? N  Do you have problems with loss of bowel control? N  Managing your Medications? Y  Comment occasional diarrhea, talks with PCP about this  Managing your Finances? N  Housekeeping or managing your Housekeeping? N    Patient Care Team: Daryl Setter, NP as PCP - General (Internal Medicine) Delford Maude BROCKS, MD as PCP - Cardiology (Cardiology) Avram Lupita BRAVO, MD as Consulting  Physician (Gastroenterology) Alfonzo Jerilynn LABOR, MD as Referring Physician (Urology) Aron Shoulders, MD as Consulting Physician (General Surgery) Eletha Boas, MD as Consulting Physician (General Surgery) [facility] Veryl Charleston, MD as Referring Physician (Dermatology) Moira Anes, MD as Referring Physician (Surgical Oncology)  I have updated your Care Teams any recent Medical Services you may have received from other providers in the past year.     Assessment:   This is a routine wellness examination for [employer].  Hearing/Vision screen Hearing Screening - Comments:: Wears hearing aids Vision Screening - Comments:: Up to date with routine eye exams with [facility].   Goals Addressed             This Visit's Progress    Begin painting artwork again.   Not on track      Depression Screen     07/14/2024   10:03 AM 07/14/2023   10:02 AM 07/08/2022    8:23 AM 07/01/2021    8:25 AM 03/05/2020    9:48 AM 10/13/2019   10:38 AM 04/14/2018   10:12 AM  PHQ 2/9 Scores  PHQ - 2 Score 1 0 0 0 0 0 0  PHQ- 9 Score 4          Fall Risk     07/14/2024   10:26 AM 01/07/2024   10:40 AM 07/14/2023    9:57 AM 07/08/2022    8:23 AM 07/01/2021    8:24 AM  Fall Risk   Falls in the past year? 0 1 0 0 0  Number falls in past yr: 0 0 0 0 0  Injury with Fall? 0 1 0 0 0  Risk for fall due to : No Fall Risks  No Fall Risks No Fall Risks   Follow up Education provided Falls evaluation completed Falls evaluation completed Falls evaluation completed  Falls prevention discussed      Data saved with a previous flowsheet row definition    MEDICARE RISK AT HOME:  Medicare Risk at Home Any stairs in or around the  home?: No If so, are there any without handrails?: No Home free of loose throw rugs in walkways, pet beds, electrical cords, etc?: Yes Adequate lighting in your home to reduce risk of falls?: Yes Life alert?: No Use of a cane, walker or w/c?: No Grab bars in the  bathroom?: No Shower chair or bench in shower?: No Elevated toilet seat or a handicapped toilet?: Yes  TIMED UP AND GO:  Was the test performed?  Yes  Length of time to ambulate 10 feet: 7 sec Gait slow and steady without use of assistive device  Cognitive Function: 6CIT completed    04/14/2018   10:14 AM  MMSE - Mini Mental State Exam  Orientation to time 5  Orientation to Place 5  Registration 3  Attention/ Calculation 5  Recall 1  Language- name 2 objects 2  Language- repeat 1  Language- follow 3 step command 3  Language- read & follow direction 1  Write a sentence 1  Copy design 1  Total score 28        07/14/2024   10:06 AM 07/14/2023   10:04 AM 07/08/2022    8:29 AM  6CIT Screen  What Year? 0 points 0 points 0 points  What month? 0 points 0 points 0 points  What time? 0 points 0 points 0 points  Count back from 20 0 points 0 points 0 points  Months in reverse 0 points 0 points 0 points  Repeat phrase 0 points 0 points 8 points  Total Score 0 points 0 points 8 points    Immunizations Immunization History  Administered Date(s) Administered   Fluad Quad(high Dose 65+) 07/31/2020, 07/09/2021, 08/12/2022   Fluad Trivalent(High Dose 65+) 07/14/2023   Hep A / Hep B 04/07/2024   INFLUENZA, HIGH DOSE SEASONAL PF 07/23/2015, 08/11/2016, 08/19/2018   Influenza Whole 08/22/2007, 07/13/2009, 07/19/2010   Influenza-Unspecified 08/02/2014, 08/03/2019   PFIZER(Purple Top)SARS-COV-2 Vaccination 02/10/2020, 03/11/2020, 10/05/2020   Pfizer Covid-19 Vaccine Bivalent Booster 6yrs & up 08/01/2021   Pfizer(Comirnaty )Fall Seasonal Vaccine 12 years and older 08/25/2023   Pneumococcal Conjugate-13 01/22/2015   Pneumococcal Polysaccharide-23 02/14/2007, 07/09/2021   Respiratory Syncytial Virus Vaccine,Recomb Aduvanted(Arexvy) 08/13/2022   Td 05/19/2004, 01/22/2015   Tdap 01/07/2024   Zoster Recombinant(Shingrix ) 08/21/2020, 12/09/2020   Zoster, Live 11/02/2009    Screening  Tests Health Maintenance  Topic Date Due   Influenza Vaccine  06/02/2024   COVID-19 Vaccine (6 - Pfizer risk 2024-25 season) 07/03/2024   Medicare Annual Wellness (AWV)  07/14/2025   DTaP/Tdap/Td (4 - Td or Tdap) 01/06/2034   Pneumococcal Vaccine: 50+ Years  Completed   Zoster Vaccines- Shingrix   Completed   HPV VACCINES  Aged Out   Meningococcal B Vaccine  Aged Out   Hepatitis B Vaccines 19-59 Average Risk  Discontinued    Health Maintenance Items Addressed: Flu vaccine given today  Additional Screening:  Vision Screening: Recommended annual ophthalmology exams for early detection of glaucoma and other disorders of the eye. Is the patient up to date with their annual eye exam?  Yes  Who is the provider or what is the name of the office in which the patient attends annual eye exams? [facility]  Dental Screening: Recommended annual dental exams for proper oral hygiene  Community Resource Referral / Chronic Care Management: CRR required this visit?  No   CCM required this visit?  No   Plan:    I have personally reviewed and noted the following in the patient's chart:  Medical and social history Use of alcohol, tobacco or illicit drugs  Current medications and supplements including opioid prescriptions. Patient is not currently taking opioid prescriptions. Functional ability and status Nutritional status Physical activity Advanced directives List of other physicians Hospitalizations, surgeries, and ER visits in previous 12 months Vitals Screenings to include cognitive, depression, and falls Referrals and appointments  In addition, I have reviewed and discussed with patient certain preventive protocols, quality metrics, and best practice recommendations. A written personalized care plan for preventive services as well as general preventive health recommendations were provided to patient.   Lolita Libra, CMA   07/14/2024   After Visit Summary: (In  Person-Printed) AVS printed and given to the patient  Notes: see phone note

## 2024-07-14 NOTE — Patient Instructions (Addendum)
 Miguel Thomas , Thank you for taking time out of your busy schedule to complete your Annual Wellness Visit with me. I enjoyed our conversation and look forward to speaking with you again next year. I, as well as your care team,  appreciate your ongoing commitment to your health goals. Please review the following plan we discussed and let me know if I can assist you in the future. Your Game plan/ To Do List     Follow up Visits: Next Medicare AWV with our clinical staff: 07/20/25  9:40am, in person.  Next Office Visit with your provider: 08/25/24 9am, Eleanor Ponto, NP.  Clinician Recommendations:  Aim for 30 minutes of exercise or brisk walking, 6-8 glasses of water, and 5 servings of fruits and vegetables each day.       This is a list of the screening recommended for you and due dates:  Health Maintenance  Topic Date Due   Flu Shot  06/02/2024   COVID-19 Vaccine (6 - Pfizer risk 2024-25 season) 07/03/2024   Medicare Annual Wellness Visit  07/13/2024   DTaP/Tdap/Td vaccine (4 - Td or Tdap) 01/06/2034   Pneumococcal Vaccine for age over 69  Completed   Zoster (Shingles) Vaccine  Completed   HPV Vaccine  Aged Out   Meningitis B Vaccine  Aged Out   Hepatitis B Vaccine  Discontinued    Advanced directives: (Provided) Advance directive discussed with you today. I have provided a copy for you to complete at home and have notarized. Once this is complete, please bring a copy in to our office so we can scan it into your chart.  Advance Care Planning is important because it:  [x]  Makes sure you receive the medical care that is consistent with your values, goals, and preferences  [x]  It provides guidance to your family and loved ones and reduces their decisional burden about whether or not they are making the right decisions based on your wishes.  Follow the link provided in your after visit summary or read over the paperwork we have mailed to you to help you started getting your Advance  Directives in place. If you need assistance in completing these, please reach out to us  so that we can help you!  See attachments for Preventive Care and Fall Prevention Tips.

## 2024-07-14 NOTE — Telephone Encounter (Signed)
 PT had AWV today and wanted PCP to know that he has been seeing Dr Migliardi for an area of concern on his scalp and he has been referred to Dr Moira in [facility] for consultation.

## 2024-07-17 DIAGNOSIS — C434 Malignant melanoma of scalp and neck: Secondary | ICD-10-CM | POA: Diagnosis not present

## 2024-07-24 DIAGNOSIS — C434 Malignant melanoma of scalp and neck: Secondary | ICD-10-CM | POA: Diagnosis not present

## 2024-07-24 DIAGNOSIS — Z08 Encounter for follow-up examination after completed treatment for malignant neoplasm: Secondary | ICD-10-CM | POA: Diagnosis not present

## 2024-07-24 DIAGNOSIS — Z85828 Personal history of other malignant neoplasm of skin: Secondary | ICD-10-CM | POA: Diagnosis not present

## 2024-07-28 DIAGNOSIS — C434 Malignant melanoma of scalp and neck: Secondary | ICD-10-CM | POA: Diagnosis not present

## 2024-08-07 ENCOUNTER — Ambulatory Visit: Admitting: Pulmonary Disease

## 2024-08-07 ENCOUNTER — Encounter: Payer: Self-pay | Admitting: Pulmonary Disease

## 2024-08-07 VITALS — BP 139/71 | HR 68 | Ht 70.0 in | Wt 164.0 lb

## 2024-08-07 DIAGNOSIS — J61 Pneumoconiosis due to asbestos and other mineral fibers: Secondary | ICD-10-CM | POA: Diagnosis not present

## 2024-08-07 DIAGNOSIS — J849 Interstitial pulmonary disease, unspecified: Secondary | ICD-10-CM

## 2024-08-07 NOTE — Progress Notes (Signed)
 Synopsis: Referred in June 2022 for Asbestosis by Eleanor Ponto, NP  Subjective:   PATIENT ID: Miguel Thomas GENDER: male DOB: 1941-01-03, MRN: [id number]   HPI  Chief Complaint  Patient presents with   Medical Management of Chronic Issues   Miguel Thomas is an 83 year old male, never smoker with GERD and hiatal hernia who returns to pulmonary clinic for pulmonary asbestosis.   Last seen by Izetta Rouleau, NP 04/16/23 where he reported no changes in his breathing and PFTs showed stable lung function.  He has been doing well since last visit. He remains active mowing his lawn and doing yard work. He denies any shortness of breath or wheezing.  CT PET scan 07/24/24 in care everywhere for melanoma does not show any new findings in the lungs. No activity noted of the pleural plaques. No new nodules.  OV 12/15/21 He is accompanied by his son. He remains active around his house and has no limitations with his activities of daily living.   Pulmonary function tests today are within normal limits.  OV 05/01/21 He had a CT Chest scan done 12/2020 which showed scattered calcified pleural plaques bilaterally. No pleural effusions. Moderate patchy confluent subpleural reticulation and ground-glass attenuation throughout both lungs with associated mild traction bronchiectasis and architectural distortion.   He was in the [employer] for 8 years. He worked as a Investment banker, operational. He is not aware of any overt asbestos exposure. He tested positive for covid 19 11/2020 and reports having a mild case quarantining at home.   He denies any dyspnea, cough or wheezing. He continues to take care of the yard work at his home and denies any limitations to his day to day activities.   Past Medical History:  Diagnosis Date   Anemia    Cancer (HCC) 10/2019   melanoma on scalp   Dyspnea    had positive covid test 11-04-19   GERD (gastroesophageal reflux disease) 12/12/2004   ring like esophageal stricture  with reflux esophagitis, also in 2011   Hiatal hernia    History of adenomatous polyp of colon    tubular adenom's in 2006;  2011; 03-05-2015   History of esophageal stricture    s/p  dilatation 2006  and 2012   Penile ulcer    Plantar fasciitis    Plantar fasciitis, left 05/18/2016   Prostate nodule    Pseudophakia of both eyes    Wears hearing aid in both ears      Family History  Problem Relation Age of Onset   Coronary artery disease Other    Heart disease Other        rheumatic   Colon cancer Neg Hx    Esophageal cancer Neg Hx    Rectal cancer Neg Hx    Stomach cancer Neg Hx      Social History   Socioeconomic History   Marital status: Widowed    Spouse name: Not on file   Number of children: 1   Years of education: Not on file   Highest education level: Not on file  Occupational History   Occupation: retired  Tobacco Use   Smoking status: Never   Smokeless tobacco: Never  Substance and Sexual Activity   Alcohol use: No    Alcohol/week: 0.0 standard drinks of alcohol   Drug use: No   Sexual activity: Not Currently  Other Topics Concern   Not on file  Social History Narrative   Retired,   Widowed  2022   Lives with his adult son   Used to work at [employer]   Daily Caffeine- use up to [employer] daily   Social Drivers of Health   Financial Resource Strain: Low Risk  (07/14/2024)   Overall Financial Resource Strain (CARDIA)    Difficulty of Paying Living Expenses: Not very hard  Food Insecurity: No Food Insecurity (07/14/2024)   Hunger Vital Sign    Worried About Running Out of Food in the Last Year: Never true    Ran Out of Food in the Last Year: Never true  Transportation Needs: No Transportation Needs (07/14/2024)   PRAPARE - Administrator, Civil Service (Medical): No    Lack of Transportation (Non-Medical): No  Physical Activity: Insufficiently Active (07/14/2024)   Exercise Vital Sign    Days of Exercise per Week: 3 days    Minutes of Exercise per  Session: 10 min  Stress: No Stress Concern Present (07/14/2024)   [employer] of Occupational Health - Occupational Stress Questionnaire    Feeling of Stress: Not at all  Social Connections: Moderately Isolated (07/14/2024)   Social Connection and Isolation Panel    Frequency of Communication with Friends and Family: Once a week    Frequency of Social Gatherings with Friends and Family: More than three times a week    Attends Religious Services: More than 4 times per year    Active Member of [employer] or Organizations: No    Attends Banker Meetings: Never    Marital Status: Widowed  Intimate Partner Violence: Not At Risk (07/14/2024)   Humiliation, Afraid, Rape, and Kick questionnaire    Fear of Current or Ex-Partner: No    Emotionally Abused: No    Physically Abused: No    Sexually Abused: No     No Known Allergies   Outpatient Medications Prior to Visit  Medication Sig Dispense Refill   omeprazole  (PRILOSEC) 40 MG capsule Take 1 capsule (40 mg total) by mouth daily. 90 capsule 2   No facility-administered medications prior to visit.    Review of Systems  Constitutional:  Negative for chills, fever, malaise/fatigue and weight loss.  HENT:  Positive for hearing loss. Negative for congestion, sinus pain and sore throat.   Eyes: Negative.   Respiratory:  Negative for cough, hemoptysis, sputum production, shortness of breath and wheezing.   Cardiovascular:  Negative for chest pain, palpitations, orthopnea, claudication and leg swelling.  Gastrointestinal:  Negative for abdominal pain, heartburn, nausea and vomiting.  Genitourinary: Negative.   Musculoskeletal:  Negative for joint pain and myalgias.  Skin:  Negative for rash.  Neurological:  Negative for weakness.  Endo/Heme/Allergies: Negative.   Psychiatric/Behavioral: Negative.      Objective:   Vitals:   08/07/24 0931  BP: 139/71  Pulse: 68  SpO2: 99%  Weight: 164 lb (74.4 kg)  Height: 5' 10 (1.778 m)     Physical Exam Constitutional:      General: He is not in acute distress. HENT:     Head: Normocephalic and atraumatic.  Eyes:     Conjunctiva/sclera: Conjunctivae normal.  Cardiovascular:     Rate and Rhythm: Normal rate and regular rhythm.     Pulses: Normal pulses.     Heart sounds: Normal heart sounds. No murmur heard. Pulmonary:     Breath sounds: No rales.  Musculoskeletal:     Right lower leg: No edema.     Left lower leg: No edema.  Neurological:  General: No focal deficit present.     Mental Status: He is alert.     CBC    Component Value Date/Time   WBC 4.9 08/25/2023 0849   RBC 4.02 (L) 08/25/2023 0849   HGB 13.1 08/25/2023 0849   HCT 40.0 08/25/2023 0849   PLT 220.0 08/25/2023 0849   MCV 99.3 08/25/2023 0849   MCH 33.2 (H) 07/31/2020 0738   MCHC 32.9 08/25/2023 0849   RDW 15.3 08/25/2023 0849   LYMPHSABS 1.3 08/25/2023 0849   MONOABS 0.5 08/25/2023 0849   EOSABS 0.3 08/25/2023 0849   BASOSABS 0.0 08/25/2023 0849      Latest Ref Rng & Units 08/25/2023    8:49 AM 02/11/2022    8:22 AM 08/01/2021    8:09 AM  BMP  Glucose 70 - 99 mg/dL 95  882  854   BUN 6 - 23 mg/dL 21  14  16    Creatinine 0.40 - 1.50 mg/dL 9.12  9.14  9.04   Sodium 135 - 145 mEq/L 139  140  139   Potassium 3.5 - 5.1 mEq/L 3.8  3.6  3.9   Chloride 96 - 112 mEq/L 103  105  103   CO2 19 - 32 mEq/L 28  27  27    Calcium 8.4 - 10.5 mg/dL 9.1  8.5  9.0    Chest imaging: CT Chest w contrast 12/20/20 Mediastinum/Nodes: No discrete thyroid  nodules. Unremarkable esophagus. No pathologically enlarged axillary, mediastinal or hilar lymph nodes.   Lungs/Pleura: No pneumothorax. A few scattered calcified pleural plaques are noted bilaterally anteriorly, posteriorly and along the hemidiaphragms. No pleural effusions. No acute consolidative airspace disease, lung masses or significant pulmonary nodules. Moderate patchy confluent subpleural reticulation and ground-glass attenuation  throughout both lungs with associated mild traction bronchiectasis and architectural distortion. No clear apicobasilar gradient to these findings. Mild honeycombing scattered in dependent lower lobes bilaterally.  Barium Swallow 02/2020 1. Severe gastroesophageal reflux. 2. Small reducible hiatal hernia. 3. No evidence of esophageal stricture or ulceration.  PFT:    Latest Ref Rng & Units 04/15/2023   11:42 AM 12/15/2021    2:36 PM  PFT Results  FVC-Pre L 3.91  4.10   FVC-Predicted Pre % 101  105   FVC-Post L 3.96  4.03   FVC-Predicted Post % 102  103   Pre FEV1/FVC % % 40  79   Post FEV1/FCV % % 80  81   FEV1-Pre L 1.58  3.26   FEV1-Predicted Pre % 58  118   FEV1-Post L 3.15  3.27   DLCO uncorrected ml/min/mmHg 24.25  19.39   DLCO UNC% % 101  80   DLCO corrected ml/min/mmHg 24.25  19.39   DLCO COR %Predicted % 101  80   DLVA Predicted % 107  85   TLC L 8.04    TLC % Predicted % 113    RV % Predicted % 163    2023: PFTs within normal limits   Assessment & Plan:   ILD (interstitial lung disease) (HCC)  Pulmonary asbestosis (HCC)  Discussion: Miguel Thomas is an 83 year old male, never smoker with GERD and hiatal hernia who returns to pulmonary clinic for pulmonary asbestosis.   Fibrotic lung disease with calcified pleural plaques Imaging shows stable fibrotic lung disease with calcified pleural plaques. No new pulmonary findings. Respiratory status stable. - Schedule annual follow-up unless symptoms change. - Advised to contact if experiencing increased dyspnea or fatigue during activities.  Follow-up in 1  year.  Dorn Chill, MD [facility] Office: [phone]   Current Outpatient Medications:    omeprazole  (PRILOSEC) 40 MG capsule, Take 1 capsule (40 mg total) by mouth daily., Disp: 90 capsule, Rfl: 2

## 2024-08-07 NOTE — Patient Instructions (Signed)
 Based on the CT PET scan showed no changes in your lung fibrosis and no activity of the pleural plaques which is good news.  Continue to remain as active as possible  Follow up in 1 year, call sooner if the breathing changes

## 2024-08-14 ENCOUNTER — Telehealth: Payer: Self-pay | Admitting: Family

## 2024-08-14 NOTE — Telephone Encounter (Signed)
 Copied from CRM [id number]. Topic: Medical Record Request - Records Request >> Aug 14, 2024 11:31 AM Ashley R wrote: Reason for CRM: Calling from [facility]. CBC, BMP, and/or a CMP and an EKG, within the last 6 months. Sending in fax to request.

## 2024-08-14 NOTE — Telephone Encounter (Signed)
 Noted

## 2024-08-17 DIAGNOSIS — Z01810 Encounter for preprocedural cardiovascular examination: Secondary | ICD-10-CM | POA: Diagnosis not present

## 2024-08-22 DIAGNOSIS — C434 Malignant melanoma of scalp and neck: Secondary | ICD-10-CM | POA: Diagnosis not present

## 2024-08-22 DIAGNOSIS — Z79899 Other long term (current) drug therapy: Secondary | ICD-10-CM | POA: Diagnosis not present

## 2024-08-22 DIAGNOSIS — C44622 Squamous cell carcinoma of skin of right upper limb, including shoulder: Secondary | ICD-10-CM | POA: Diagnosis not present

## 2024-08-22 DIAGNOSIS — L988 Other specified disorders of the skin and subcutaneous tissue: Secondary | ICD-10-CM | POA: Diagnosis not present

## 2024-08-25 ENCOUNTER — Other Ambulatory Visit (HOSPITAL_BASED_OUTPATIENT_CLINIC_OR_DEPARTMENT_OTHER): Payer: Self-pay

## 2024-08-25 ENCOUNTER — Ambulatory Visit: Payer: Medicare Other | Admitting: Family

## 2024-08-25 VITALS — BP 118/57 | HR 71 | Temp 98.4°F | Resp 16 | Ht 70.0 in | Wt 169.0 lb

## 2024-08-25 DIAGNOSIS — Z13220 Encounter for screening for lipoid disorders: Secondary | ICD-10-CM

## 2024-08-25 DIAGNOSIS — R739 Hyperglycemia, unspecified: Secondary | ICD-10-CM

## 2024-08-25 DIAGNOSIS — M8580 Other specified disorders of bone density and structure, unspecified site: Secondary | ICD-10-CM | POA: Diagnosis not present

## 2024-08-25 DIAGNOSIS — J849 Interstitial pulmonary disease, unspecified: Secondary | ICD-10-CM

## 2024-08-25 DIAGNOSIS — Z0000 Encounter for general adult medical examination without abnormal findings: Secondary | ICD-10-CM

## 2024-08-25 DIAGNOSIS — D649 Anemia, unspecified: Secondary | ICD-10-CM | POA: Diagnosis not present

## 2024-08-25 DIAGNOSIS — C434 Malignant melanoma of scalp and neck: Secondary | ICD-10-CM | POA: Diagnosis not present

## 2024-08-25 DIAGNOSIS — K2100 Gastro-esophageal reflux disease with esophagitis, without bleeding: Secondary | ICD-10-CM | POA: Diagnosis not present

## 2024-08-25 LAB — CBC WITH DIFFERENTIAL/PLATELET
Basophils Absolute: 0 K/uL (ref 0.0–0.1)
Basophils Relative: 0.7 % (ref 0.0–3.0)
Eosinophils Absolute: 0.3 K/uL (ref 0.0–0.7)
Eosinophils Relative: 5.5 % — ABNORMAL HIGH (ref 0.0–5.0)
HCT: 37.2 % — ABNORMAL LOW (ref 39.0–52.0)
Hemoglobin: 12.6 g/dL — ABNORMAL LOW (ref 13.0–17.0)
Lymphocytes Relative: 24.5 % (ref 12.0–46.0)
Lymphs Abs: 1.2 K/uL (ref 0.7–4.0)
MCHC: 33.9 g/dL (ref 30.0–36.0)
MCV: 98.3 fl (ref 78.0–100.0)
Monocytes Absolute: 0.5 K/uL (ref 0.1–1.0)
Monocytes Relative: 9.6 % (ref 3.0–12.0)
Neutro Abs: 2.9 K/uL (ref 1.4–7.7)
Neutrophils Relative %: 59.7 % (ref 43.0–77.0)
Platelets: 209 K/uL (ref 150.0–400.0)
RBC: 3.78 Mil/uL — ABNORMAL LOW (ref 4.22–5.81)
RDW: 16 % — ABNORMAL HIGH (ref 11.5–15.5)
WBC: 4.9 K/uL (ref 4.0–10.5)

## 2024-08-25 LAB — IBC + FERRITIN
Ferritin: 78 ng/mL (ref 22.0–322.0)
Iron: 105 ug/dL (ref 42–165)
Saturation Ratios: 35 % (ref 20.0–50.0)
TIBC: 299.6 ug/dL (ref 250.0–450.0)
Transferrin: 214 mg/dL (ref 212.0–360.0)

## 2024-08-25 LAB — LIPID PANEL
Cholesterol: 163 mg/dL (ref 0–200)
HDL: 50.7 mg/dL (ref >39.00)
LDL Cholesterol: 97 mg/dL (ref 0–99)
NonHDL: 112.1
Total CHOL/HDL Ratio: 3
Triglycerides: 76 mg/dL (ref 0.0–149.0)
VLDL: 15.2 mg/dL (ref 0.0–40.0)

## 2024-08-25 LAB — COMPREHENSIVE METABOLIC PANEL WITH GFR
ALT: 19 U/L (ref 0–53)
AST: 25 U/L (ref 0–37)
Albumin: 3.8 g/dL (ref 3.5–5.2)
Alkaline Phosphatase: 83 U/L (ref 39–117)
BUN: 17 mg/dL (ref 6–23)
CO2: 26 meq/L (ref 19–32)
Calcium: 8.5 mg/dL (ref 8.4–10.5)
Chloride: 103 meq/L (ref 96–112)
Creatinine, Ser: 0.94 mg/dL (ref 0.40–1.50)
GFR: 73.82 mL/min (ref >60.00)
Glucose, Bld: 128 mg/dL — ABNORMAL HIGH (ref 70–99)
Potassium: 3.7 meq/L (ref 3.5–5.1)
Sodium: 138 meq/L (ref 135–145)
Total Bilirubin: 0.7 mg/dL (ref 0.2–1.2)
Total Protein: 6.9 g/dL (ref 6.0–8.3)

## 2024-08-25 LAB — B12 AND FOLATE PANEL
Folate: 12.2 ng/mL (ref >5.9)
Vitamin B-12: 244 pg/mL (ref 211–911)

## 2024-08-25 LAB — HEMOGLOBIN A1C: Hgb A1c MFr Bld: 5.3 % (ref 4.6–6.5)

## 2024-08-25 MED ORDER — COMIRNATY 30 MCG/0.3ML IM SUSY
0.3000 mL | PREFILLED_SYRINGE | Freq: Once | INTRAMUSCULAR | 0 refills | Status: AC
  Filled 2024-08-25: qty 0.3, 1d supply, fill #0

## 2024-08-25 NOTE — Assessment & Plan Note (Signed)
 Mild macrocytic anemia noted at time of surgery. Will update CBC, b12, folate, iron studies.

## 2024-08-25 NOTE — Assessment & Plan Note (Signed)
 Stable on omeprazole.

## 2024-08-25 NOTE — Assessment & Plan Note (Signed)
 Stable, following annually with Pulmonology.

## 2024-08-25 NOTE — Assessment & Plan Note (Addendum)
 S/p wide excision with skin graft, following with Dr. Moira. No sign of metastasis on recent PET CT.

## 2024-08-25 NOTE — Assessment & Plan Note (Addendum)
 Patient had normal bone density in 2022.

## 2024-08-25 NOTE — Assessment & Plan Note (Signed)
  Flu shot up to date. Dental and eye check-ups current. - Administer COVID vaccine at pharmacy. - Encourage healthy eating and exercise. - Schedule routine follow-up in one year unless needed sooner.

## 2024-08-25 NOTE — Progress Notes (Signed)
 Subjective:     Patient ID: Miguel Thomas, male    DOB: 08/25/1941, 83 y.o.   MRN: [id number]  Chief Complaint  Patient presents with   Annual Exam    HPI  Discussed the use of AI scribe software for clinical note transcription with the patient, who gave verbal consent to proceed.  History of Present Illness  Miguel Thomas is an 83 year old male who presents for a follow-up after recent surgery for wide excision of scalp melanoma with full thickness skin graft from the left delta pectoral groove with pneumatic burr of the skull.   He is scheduled to follow up with the surgeon on Monday 10/27. He cannot wear a hat and must avoid bumping his head. He takes pain medication every six hours and experiences minimal pain. Initially, he had difficulty sleeping post-surgery but now sleeps better.  He maintains a healthy diet, including fruits like bananas, grapes, and tangerines, and avoids sugar. He experiences occasional diarrhea, possibly related to cereal consumption, and is trying to avoid sugary cereals. He takes Prilosec every morning to manage his stomach, which is effective unless he misses a dose.  He engages in physical activity by mowing the grass and walking in stores. He recently had a root canal and crown placed and saw an eye doctor in March, with a follow-up advised in two years if no problems arise. He uses hearing aids. No cough, cold, vision problems with glasses, leg swelling, urination problems, muscle or joint pain, headaches, or concerns about depression or anxiety.  Immunizations: due for covid booster Diet: tries to eat healthy Exercise:  mows the lawn Dental: up to date Vision: up to date      There are no preventive care reminders to display for this patient.   Past Medical History:  Diagnosis Date   Anemia    Cancer (HCC) 10/2019   melanoma on scalp   Dyspnea    had positive covid test 11-04-19   GERD (gastroesophageal reflux disease)  12/12/2004   ring like esophageal stricture with reflux esophagitis, also in 2011   Hiatal hernia    History of adenomatous polyp of colon    tubular adenom's in 2006;  2011; 03-05-2015   History of esophageal stricture    s/p  dilatation 2006  and 2012   Penile ulcer    Plantar fasciitis    Plantar fasciitis, left 05/18/2016   Prostate nodule    Pseudophakia of both eyes    Wears hearing aid in both ears     Past Surgical History:  Procedure Laterality Date   APPLICATION OF A-CELL OF EXTREMITY N/A 12/05/2019   Procedure: APPLICATION OF A-CELL;  Surgeon: Aron Shoulders, MD;  Location: [facility];  Service: General;  Laterality: N/A;   CARDIOVASCULAR STRESS TEST  06/11/2011   normal nuclear study w/ no ischemia/  normal LV function and wall motion, ef 58%   CATARACT EXTRACTION W/ INTRAOCULAR LENS  IMPLANT, BILATERAL  2008 approx.   CHOLECYSTECTOMY N/A 02/27/2020   Procedure: LAPAROSCOPIC CHOLECYSTECTOMY;  Surgeon: Eletha Boas, MD;  Location: [facility];  Service: General;  Laterality: N/A;   COLONOSCOPY  last one 03-05-2015   ERCP N/A 02/26/2020   Procedure: ENDOSCOPIC RETROGRADE CHOLANGIOPANCREATOGRAPHY (ERCP);  Surgeon: Abran Norleen SAILOR, MD;  Location: [facility];  Service: Endoscopy;  Laterality: N/A;   ESOPHAGOGASTRODUODENOSCOPY  last one 01-06-2011   INGUINAL HERNIA REPAIR Right 1990's   MELANOMA EXCISION N/A 12/05/2019   Procedure: WIDE LOCAL EXCISION MELANOMA  EXCISION OF SCALP;  Surgeon: Aron Shoulders, MD;  Location: [facility];  Service: General;  Laterality: N/A;   PENILE BIOPSY N/A 03/12/2017   Procedure: EXCISIONAL GLANS PENILE BIOPSY;  Surgeon: Nieves Cough, MD;  Location: [facility];  Service: Urology;  Laterality: N/A;   PROSTATE BIOPSY  11/2011   normal   REMOVAL OF STONES  02/26/2020   Procedure: REMOVAL OF STONES;  Surgeon: Abran Norleen SAILOR, MD;  Location: [facility];  Service: Endoscopy;;   SKIN FULL THICKNESS GRAFT Left  12/05/2019   Procedure: SKIN GRAFT FULL THICKNESS FROM LEFT UPPER CHEST TO SCALP;  Surgeon: Aron Shoulders, MD;  Location: [facility];  Service: General;  Laterality: Left;   SPHINCTEROTOMY  02/26/2020   Procedure: SPHINCTEROTOMY;  Surgeon: Abran Norleen SAILOR, MD;  Location: [facility];  Service: Endoscopy;;   TONSILLECTOMY  child    Family History  Problem Relation Age of Onset   Coronary artery disease Other    Heart disease Other        rheumatic   Colon cancer Neg Hx    Esophageal cancer Neg Hx    Rectal cancer Neg Hx    Stomach cancer Neg Hx     Social History   Socioeconomic History   Marital status: Widowed    Spouse name: Not on file   Number of children: 1   Years of education: Not on file   Highest education level: Not on file  Occupational History   Occupation: retired  Tobacco Use   Smoking status: Never   Smokeless tobacco: Never  Substance and Sexual Activity   Alcohol use: No    Alcohol/week: 0.0 standard drinks of alcohol   Drug use: No   Sexual activity: Not Currently  Other Topics Concern   Not on file  Social History Narrative   Retired,   Widowed 2022   Lives with his adult son   Used to work at [employer]   Daily Caffeine- use up to [employer] daily   Social Drivers of [employer] Strain: Low Risk  (07/14/2024)   Overall Financial Resource Strain (CARDIA)    Difficulty of Paying Living Expenses: Not very hard  Food Insecurity: No Food Insecurity (07/14/2024)   Hunger Vital Sign    Worried About Running Out of Food in the Last Year: Never true    Ran Out of Food in the Last Year: Never true  Transportation Needs: No Transportation Needs (07/14/2024)   PRAPARE - Administrator, Civil Service (Medical): No    Lack of Transportation (Non-Medical): No  Physical Activity: Insufficiently Active (07/14/2024)   Exercise Vital Sign    Days of Exercise per Week: 3 days    Minutes of Exercise per Session: 10 min  Stress: No  Stress Concern Present (07/14/2024)   [employer] of Occupational Health - Occupational Stress Questionnaire    Feeling of Stress: Not at all  Social Connections: Moderately Isolated (07/14/2024)   Social Connection and Isolation Panel    Frequency of Communication with Friends and Family: Once a week    Frequency of Social Gatherings with Friends and Family: More than three times a week    Attends Religious Services: More than 4 times per year    Active Member of [employer] or Organizations: No    Attends Banker Meetings: Never    Marital Status: Widowed  Intimate Partner Violence: Not At Risk (07/14/2024)   Humiliation, Afraid, Rape,  and Kick questionnaire    Fear of Current or Ex-Partner: No    Emotionally Abused: No    Physically Abused: No    Sexually Abused: No    Outpatient Medications Prior to Visit  Medication Sig Dispense Refill   HYDROcodone-acetaminophen  (NORCO/VICODIN) 5-325 MG tablet Take 1 tablet by mouth.     omeprazole  (PRILOSEC) 40 MG capsule Take 1 capsule (40 mg total) by mouth daily. 90 capsule 2   No facility-administered medications prior to visit.    No Known Allergies  Review of Systems  Constitutional:  Negative for weight loss.  HENT:  Positive for hearing loss (wears hearing aids). Negative for congestion.   Eyes:  Negative for blurred vision.  Respiratory:  Negative for cough.   Cardiovascular:  Negative for leg swelling.  Gastrointestinal:  Positive for diarrhea (occasionally- attributes to certain foods). Negative for constipation.  Genitourinary:  Negative for dysuria and frequency.  Musculoskeletal:  Negative for joint pain and myalgias.  Neurological:  Negative for headaches.  Psychiatric/Behavioral:  Negative for depression. The patient is not nervous/anxious.        Objective:    Physical Exam   BP (!) 118/57 (BP Location: Right Arm, Patient Position: Sitting, Cuff Size: Normal)   Pulse 71   Temp 98.4 F (36.9 C)  (Oral)   Resp 16   Ht 5' 10 (1.778 m)   Wt 169 lb (76.7 kg)   SpO2 99%   BMI 24.25 kg/m  Wt Readings from Last 3 Encounters:  08/25/24 169 lb (76.7 kg)  08/07/24 164 lb (74.4 kg)  07/14/24 170 lb 12.8 oz (77.5 kg)  Physical Exam  Constitutional: He is oriented to person, place, and time. He appears well-developed and well-nourished. No distress.  HENT:  Head: has bonnet dressing on crown of head which is sutured to his scalp Right Ear: Tympanic membrane and ear canal normal.  Left Ear: Tympanic membrane and ear canal normal.  Mouth/Throat: Oropharynx is clear and moist.  Eyes: Pupils are equal, round, and reactive to light. No scleral icterus.  Neck: Normal range of motion. No thyromegaly present.  Cardiovascular: Normal rate and regular rhythm.   No murmur heard. Pulmonary/Chest: Effort normal and breath sounds normal. No respiratory distress. He has no wheezes. He has no rales. He exhibits no tenderness.  Abdominal: Soft. Bowel sounds are normal. He exhibits no distension and no mass. There is no tenderness. There is no rebound and no guarding.  Musculoskeletal: He exhibits no edema.  Lymphadenopathy:    He has no cervical adenopathy.  Neurological: He is alert and oriented to person, place, and time. He has normal patellar reflexes. He exhibits normal muscle tone. Coordination normal.  Skin: Skin is warm and dry.  Psychiatric: He has a normal mood and affect. His behavior is normal. Judgment and thought content normal.           Assessment & Plan:         Assessment & Plan:   Problem List Items Addressed This Visit       Unprioritized   Preventative health care    Flu shot up to date. Dental and eye check-ups current. - Administer COVID vaccine at pharmacy. - Encourage healthy eating and exercise. - Schedule routine follow-up in one year unless needed sooner.      Osteopenia   Patient had normal bone density in 2022.         Melanoma of scalp (HCC)    S/p wide excision with skin  graft, following with Dr. Moira. No sign of metastasis on recent PET CT.       ILD (interstitial lung disease) (HCC)   Stable, following annually with Pulmonology.       GERD   Stable on omeprazole .       Anemia   Mild macrocytic anemia noted at time of surgery. Will update CBC, b12, folate, iron studies.       Relevant Orders   CBC w/Diff   B12 and Folate Panel   IBC + Ferritin   Other Visit Diagnoses       Hyperglycemia    -  Primary   Relevant Orders   Comp Met (CMET)   HgB A1c     Screening for lipoid disorders       Relevant Orders   Lipid panel       I am having Miguel IVAR Thomas maintain his omeprazole  and HYDROcodone-acetaminophen .  No orders of the defined types were placed in this encounter.

## 2024-08-25 NOTE — Patient Instructions (Signed)
 VISIT SUMMARY:  You had a follow-up visit after your recent head surgery. Your pain is minimal and managed with medication. You are maintaining a healthy diet and staying active. You received a flu shot today and are advised to get a COVID vaccine at the pharmacy.  YOUR PLAN:  STATUS POST SCALP SKIN SURGERY: You recently had surgery on your scalp and are experiencing minimal pain. -Follow up with your dermatologist on Monday for assessment and potential suture removal. -Continue taking your pain medication as needed.  GASTROESOPHAGEAL REFLUX DISEASE (GERD): Your GERD is well-controlled with daily Prilosec. -Continue taking Prilosec every morning.  INTERMITTENT DIARRHEA: You have occasional diarrhea, possibly related to your diet.GENERAL HEALTH MAINTENANCE: You are up to date with your dental and eye check-ups and received a flu shot today. -Get a COVID vaccine at the pharmacy. -Continue eating healthy and staying active. -Schedule a routine follow-up in one year unless you need to be seen sooner.

## 2024-08-27 ENCOUNTER — Telehealth: Payer: Self-pay | Admitting: Family

## 2024-08-27 ENCOUNTER — Other Ambulatory Visit: Payer: Self-pay | Admitting: Family

## 2024-08-27 DIAGNOSIS — E538 Deficiency of other specified B group vitamins: Secondary | ICD-10-CM | POA: Insufficient documentation

## 2024-08-27 DIAGNOSIS — D649 Anemia, unspecified: Secondary | ICD-10-CM

## 2024-08-27 NOTE — Telephone Encounter (Signed)
 B12 is low.  Add b12 500 mcg po daily otc. Repeat b12 and cbc in 3 months.   Anemia is stable.  Sugar is mildly elevated but not in the diabetes range.

## 2024-08-28 ENCOUNTER — Other Ambulatory Visit: Payer: Self-pay

## 2024-08-28 MED ORDER — OMEPRAZOLE 40 MG PO CPDR: 40.0000 mg | DELAYED_RELEASE_CAPSULE | Freq: Every day | ORAL | 2 refills | Status: AC

## 2024-08-28 NOTE — Telephone Encounter (Signed)
 Patient called back and was notified of results, providers recommendations and scheduled to return in January for labs. Orders in as future.

## 2024-08-28 NOTE — Telephone Encounter (Signed)
 Called patient but no answer, left voice mail for patient to call back.

## 2024-09-01 ENCOUNTER — Telehealth: Payer: Self-pay | Admitting: Family

## 2024-09-01 NOTE — Telephone Encounter (Signed)
 Copied from CRM #[id number]. Topic: Clinical - Medication Question >> Sep 01, 2024  9:50 AM Nessti S wrote: Reason for CRM: pt called because he couldn't find B12 mg. He wanted to know if it is pills or gummy bears? Also wanted to know what it is for. Call back number [phone]   ----------------------------------------------------------------------- From previous Reason for Contact - Medical Advice: Reason for CRM:

## 2024-09-01 NOTE — Telephone Encounter (Signed)
 Called home and cell a few times  but no answer. Left detail message for him to be aware (as per our previous conversation) he needs to take B12 500 mcg daily.

## 2024-11-27 ENCOUNTER — Other Ambulatory Visit

## 2024-12-04 ENCOUNTER — Other Ambulatory Visit

## 2024-12-11 ENCOUNTER — Other Ambulatory Visit

## 2025-07-20 ENCOUNTER — Ambulatory Visit
